# Patient Record
Sex: Male | Born: 1960 | Race: Black or African American | Hispanic: No | Smoking: Current every day smoker
Health system: Southern US, Community
[De-identification: ages and names within clinical notes are randomized; demographics above are authoritative.]

## PROBLEM LIST (undated history)

## (undated) DIAGNOSIS — B1920 Unspecified viral hepatitis C without hepatic coma: Secondary | ICD-10-CM

## (undated) DIAGNOSIS — R569 Unspecified convulsions: Secondary | ICD-10-CM

## (undated) DIAGNOSIS — E119 Type 2 diabetes mellitus without complications: Secondary | ICD-10-CM

## (undated) DIAGNOSIS — F203 Undifferentiated schizophrenia: Secondary | ICD-10-CM

## (undated) DIAGNOSIS — I10 Essential (primary) hypertension: Secondary | ICD-10-CM

## (undated) HISTORY — DX: Undifferentiated schizophrenia: F20.3

## (undated) HISTORY — DX: Essential (primary) hypertension: I10

## (undated) HISTORY — DX: Unspecified viral hepatitis C without hepatic coma: B19.20

## (undated) HISTORY — DX: Type 2 diabetes mellitus without complications: E11.9

---

## 2021-03-27 ENCOUNTER — Emergency Department (HOSPITAL_COMMUNITY): Payer: No Typology Code available for payment source

## 2021-03-27 ENCOUNTER — Encounter (HOSPITAL_COMMUNITY): Payer: Self-pay

## 2021-03-27 ENCOUNTER — Inpatient Hospital Stay (HOSPITAL_COMMUNITY)
Admission: EM | Admit: 2021-03-27 | Discharge: 2021-07-30 | DRG: 956 | Disposition: A | Discharge status: Home / Self Care | Payer: No Typology Code available for payment source | Attending: Internal Medicine | Admitting: Internal Medicine

## 2021-03-27 DIAGNOSIS — E559 Vitamin D deficiency, unspecified: Secondary | ICD-10-CM | POA: Diagnosis present

## 2021-03-27 DIAGNOSIS — E876 Hypokalemia: Secondary | ICD-10-CM | POA: Diagnosis present

## 2021-03-27 DIAGNOSIS — S52371A Galeazzi's fracture of right radius, initial encounter for closed fracture: Secondary | ICD-10-CM | POA: Diagnosis present

## 2021-03-27 DIAGNOSIS — S42231A 3-part fracture of surgical neck of right humerus, initial encounter for closed fracture: Secondary | ICD-10-CM | POA: Diagnosis present

## 2021-03-27 DIAGNOSIS — T148XXA Other injury of unspecified body region, initial encounter: Secondary | ICD-10-CM

## 2021-03-27 DIAGNOSIS — Z781 Physical restraint status: Secondary | ICD-10-CM

## 2021-03-27 DIAGNOSIS — Z978 Presence of other specified devices: Secondary | ICD-10-CM

## 2021-03-27 DIAGNOSIS — I10 Essential (primary) hypertension: Secondary | ICD-10-CM

## 2021-03-27 DIAGNOSIS — Z20822 Contact with and (suspected) exposure to covid-19: Secondary | ICD-10-CM | POA: Diagnosis present

## 2021-03-27 DIAGNOSIS — S83106A Unspecified dislocation of unspecified knee, initial encounter: Secondary | ICD-10-CM

## 2021-03-27 DIAGNOSIS — Z227 Latent tuberculosis: Secondary | ICD-10-CM

## 2021-03-27 DIAGNOSIS — S82201B Unspecified fracture of shaft of right tibia, initial encounter for open fracture type I or II: Secondary | ICD-10-CM

## 2021-03-27 DIAGNOSIS — F10229 Alcohol dependence with intoxication, unspecified: Secondary | ICD-10-CM | POA: Diagnosis present

## 2021-03-27 DIAGNOSIS — Z0189 Encounter for other specified special examinations: Secondary | ICD-10-CM

## 2021-03-27 DIAGNOSIS — S069XAA Unspecified intracranial injury with loss of consciousness status unknown, initial encounter: Secondary | ICD-10-CM

## 2021-03-27 DIAGNOSIS — B182 Chronic viral hepatitis C: Secondary | ICD-10-CM | POA: Diagnosis present

## 2021-03-27 DIAGNOSIS — Z5900 Homelessness unspecified: Secondary | ICD-10-CM

## 2021-03-27 DIAGNOSIS — S066X1A Traumatic subarachnoid hemorrhage with loss of consciousness of 30 minutes or less, initial encounter: Secondary | ICD-10-CM | POA: Diagnosis present

## 2021-03-27 DIAGNOSIS — Y92410 Unspecified street and highway as the place of occurrence of the external cause: Secondary | ICD-10-CM

## 2021-03-27 DIAGNOSIS — F1020 Alcohol dependence, uncomplicated: Secondary | ICD-10-CM

## 2021-03-27 DIAGNOSIS — Y908 Blood alcohol level of 240 mg/100 ml or more: Secondary | ICD-10-CM | POA: Diagnosis present

## 2021-03-27 DIAGNOSIS — S72421A Displaced fracture of lateral condyle of right femur, initial encounter for closed fracture: Secondary | ICD-10-CM | POA: Diagnosis present

## 2021-03-27 DIAGNOSIS — S82141C Displaced bicondylar fracture of right tibia, initial encounter for open fracture type IIIA, IIIB, or IIIC: Principal | ICD-10-CM | POA: Diagnosis present

## 2021-03-27 DIAGNOSIS — F17210 Nicotine dependence, cigarettes, uncomplicated: Secondary | ICD-10-CM | POA: Diagnosis present

## 2021-03-27 DIAGNOSIS — F259 Schizoaffective disorder, unspecified: Secondary | ICD-10-CM | POA: Diagnosis present

## 2021-03-27 DIAGNOSIS — L8990 Pressure ulcer of unspecified site, unspecified stage: Secondary | ICD-10-CM | POA: Insufficient documentation

## 2021-03-27 DIAGNOSIS — E8809 Other disorders of plasma-protein metabolism, not elsewhere classified: Secondary | ICD-10-CM | POA: Diagnosis present

## 2021-03-27 DIAGNOSIS — R9431 Abnormal electrocardiogram [ECG] [EKG]: Secondary | ICD-10-CM | POA: Diagnosis present

## 2021-03-27 DIAGNOSIS — S0101XA Laceration without foreign body of scalp, initial encounter: Secondary | ICD-10-CM | POA: Diagnosis present

## 2021-03-27 DIAGNOSIS — S069X1A Unspecified intracranial injury with loss of consciousness of 30 minutes or less, initial encounter: Secondary | ICD-10-CM

## 2021-03-27 DIAGNOSIS — R748 Abnormal levels of other serum enzymes: Secondary | ICD-10-CM | POA: Diagnosis present

## 2021-03-27 DIAGNOSIS — R402431 Glasgow coma scale score 3-8, in the field [EMT or ambulance]: Secondary | ICD-10-CM | POA: Diagnosis present

## 2021-03-27 DIAGNOSIS — R7611 Nonspecific reaction to tuberculin skin test without active tuberculosis: Secondary | ICD-10-CM

## 2021-03-27 DIAGNOSIS — S82401B Unspecified fracture of shaft of right fibula, initial encounter for open fracture type I or II: Secondary | ICD-10-CM

## 2021-03-27 DIAGNOSIS — R54 Age-related physical debility: Secondary | ICD-10-CM | POA: Diagnosis present

## 2021-03-27 DIAGNOSIS — S062X1A Diffuse traumatic brain injury with loss of consciousness of 30 minutes or less, initial encounter: Secondary | ICD-10-CM

## 2021-03-27 DIAGNOSIS — E11649 Type 2 diabetes mellitus with hypoglycemia without coma: Secondary | ICD-10-CM | POA: Diagnosis not present

## 2021-03-27 DIAGNOSIS — K219 Gastro-esophageal reflux disease without esophagitis: Secondary | ICD-10-CM | POA: Diagnosis present

## 2021-03-27 DIAGNOSIS — Z419 Encounter for procedure for purposes other than remedying health state, unspecified: Secondary | ICD-10-CM

## 2021-03-27 DIAGNOSIS — F209 Schizophrenia, unspecified: Secondary | ICD-10-CM

## 2021-03-27 DIAGNOSIS — S82831B Other fracture of upper and lower end of right fibula, initial encounter for open fracture type I or II: Secondary | ICD-10-CM | POA: Diagnosis present

## 2021-03-27 DIAGNOSIS — F10231 Alcohol dependence with withdrawal delirium: Secondary | ICD-10-CM | POA: Diagnosis not present

## 2021-03-27 DIAGNOSIS — S42201A Unspecified fracture of upper end of right humerus, initial encounter for closed fracture: Secondary | ICD-10-CM

## 2021-03-27 DIAGNOSIS — T1490XA Injury, unspecified, initial encounter: Secondary | ICD-10-CM

## 2021-03-27 DIAGNOSIS — E1165 Type 2 diabetes mellitus with hyperglycemia: Secondary | ICD-10-CM | POA: Diagnosis present

## 2021-03-27 DIAGNOSIS — Z452 Encounter for adjustment and management of vascular access device: Secondary | ICD-10-CM

## 2021-03-27 DIAGNOSIS — D62 Acute posthemorrhagic anemia: Secondary | ICD-10-CM | POA: Diagnosis present

## 2021-03-27 DIAGNOSIS — S42131A Displaced fracture of coracoid process, right shoulder, initial encounter for closed fracture: Secondary | ICD-10-CM | POA: Diagnosis present

## 2021-03-27 DIAGNOSIS — S52611A Displaced fracture of right ulna styloid process, initial encounter for closed fracture: Secondary | ICD-10-CM | POA: Diagnosis present

## 2021-03-27 DIAGNOSIS — E119 Type 2 diabetes mellitus without complications: Secondary | ICD-10-CM

## 2021-03-27 DIAGNOSIS — I609 Nontraumatic subarachnoid hemorrhage, unspecified: Secondary | ICD-10-CM

## 2021-03-27 DIAGNOSIS — Z23 Encounter for immunization: Secondary | ICD-10-CM

## 2021-03-27 DIAGNOSIS — I959 Hypotension, unspecified: Secondary | ICD-10-CM | POA: Diagnosis present

## 2021-03-27 DIAGNOSIS — F10220 Alcohol dependence with intoxication, uncomplicated: Secondary | ICD-10-CM | POA: Diagnosis present

## 2021-03-27 MED ORDER — TETANUS-DIPHTH-ACELL PERTUSSIS 5-2.5-18.5 LF-MCG/0.5 IM SUSY
0.5000 mL | PREFILLED_SYRINGE | Freq: Once | INTRAMUSCULAR | Status: AC
  Administered 2021-03-27: 0.5 mL via INTRAMUSCULAR
  Filled 2021-03-27: qty 0.5

## 2021-03-27 MED ORDER — DEXMEDETOMIDINE BOLUS VIA INFUSION
1.0000 ug/kg | Freq: Once | INTRAVENOUS | Status: AC
  Administered 2021-03-27: 68 ug via INTRAVENOUS
  Filled 2021-03-27: qty 68

## 2021-03-27 MED ORDER — CEFAZOLIN SODIUM-DEXTROSE 2-4 GM/100ML-% IV SOLN
2.0000 g | Freq: Once | INTRAVENOUS | Status: AC
  Administered 2021-03-27: 2 g via INTRAVENOUS
  Filled 2021-03-27: qty 100

## 2021-03-27 MED ORDER — SUCCINYLCHOLINE CHLORIDE 20 MG/ML IJ SOLN
INTRAMUSCULAR | Status: AC | PRN
  Administered 2021-03-27: 150 mg via INTRAVENOUS

## 2021-03-27 MED ORDER — IOHEXOL 300 MG/ML  SOLN
100.0000 mL | Freq: Once | INTRAMUSCULAR | Status: AC | PRN
  Administered 2021-03-27: 100 mL via INTRAVENOUS

## 2021-03-27 MED ORDER — ETOMIDATE 2 MG/ML IV SOLN
INTRAVENOUS | Status: AC | PRN
  Administered 2021-03-27: 20 mg via INTRAVENOUS

## 2021-03-27 MED ORDER — IOHEXOL 350 MG/ML SOLN
100.0000 mL | Freq: Once | INTRAVENOUS | Status: AC | PRN
  Administered 2021-03-27: 100 mL via INTRAVENOUS

## 2021-03-27 MED ORDER — PROPOFOL 1000 MG/100ML IV EMUL
INTRAVENOUS | Status: AC
  Filled 2021-03-27: qty 100

## 2021-03-27 MED ORDER — DEXMEDETOMIDINE HCL IN NACL 400 MCG/100ML IV SOLN
0.4000 ug/kg/h | INTRAVENOUS | Status: AC
  Administered 2021-03-27: 0.4 ug/kg/h via INTRAVENOUS
  Filled 2021-03-27: qty 100
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
## 2021-03-28 ENCOUNTER — Inpatient Hospital Stay (HOSPITAL_COMMUNITY): Payer: No Typology Code available for payment source

## 2021-03-28 ENCOUNTER — Encounter (HOSPITAL_COMMUNITY): Admission: EM | Disposition: A | Discharge status: Home / Self Care | Payer: Self-pay | Source: Home / Self Care

## 2021-03-28 ENCOUNTER — Emergency Department (HOSPITAL_COMMUNITY): Payer: No Typology Code available for payment source

## 2021-03-28 ENCOUNTER — Inpatient Hospital Stay (HOSPITAL_COMMUNITY): Payer: No Typology Code available for payment source | Admitting: Anesthesiology

## 2021-03-28 DIAGNOSIS — I959 Hypotension, unspecified: Secondary | ICD-10-CM | POA: Diagnosis present

## 2021-03-28 DIAGNOSIS — E8809 Other disorders of plasma-protein metabolism, not elsewhere classified: Secondary | ICD-10-CM | POA: Diagnosis present

## 2021-03-28 DIAGNOSIS — I10 Essential (primary) hypertension: Secondary | ICD-10-CM | POA: Diagnosis present

## 2021-03-28 DIAGNOSIS — E11649 Type 2 diabetes mellitus with hypoglycemia without coma: Secondary | ICD-10-CM | POA: Diagnosis not present

## 2021-03-28 DIAGNOSIS — E559 Vitamin D deficiency, unspecified: Secondary | ICD-10-CM | POA: Diagnosis present

## 2021-03-28 DIAGNOSIS — Z20822 Contact with and (suspected) exposure to covid-19: Secondary | ICD-10-CM | POA: Diagnosis present

## 2021-03-28 DIAGNOSIS — Z5900 Homelessness unspecified: Secondary | ICD-10-CM | POA: Diagnosis not present

## 2021-03-28 DIAGNOSIS — F259 Schizoaffective disorder, unspecified: Secondary | ICD-10-CM | POA: Diagnosis present

## 2021-03-28 DIAGNOSIS — S066X1A Traumatic subarachnoid hemorrhage with loss of consciousness of 30 minutes or less, initial encounter: Secondary | ICD-10-CM | POA: Diagnosis present

## 2021-03-28 DIAGNOSIS — S0101XA Laceration without foreign body of scalp, initial encounter: Secondary | ICD-10-CM | POA: Diagnosis present

## 2021-03-28 DIAGNOSIS — S72421A Displaced fracture of lateral condyle of right femur, initial encounter for closed fracture: Secondary | ICD-10-CM | POA: Diagnosis present

## 2021-03-28 DIAGNOSIS — Y908 Blood alcohol level of 240 mg/100 ml or more: Secondary | ICD-10-CM | POA: Diagnosis present

## 2021-03-28 DIAGNOSIS — Z23 Encounter for immunization: Secondary | ICD-10-CM | POA: Diagnosis not present

## 2021-03-28 DIAGNOSIS — R54 Age-related physical debility: Secondary | ICD-10-CM | POA: Diagnosis present

## 2021-03-28 DIAGNOSIS — Y92410 Unspecified street and highway as the place of occurrence of the external cause: Secondary | ICD-10-CM | POA: Diagnosis not present

## 2021-03-28 DIAGNOSIS — T1490XA Injury, unspecified, initial encounter: Secondary | ICD-10-CM | POA: Diagnosis present

## 2021-03-28 DIAGNOSIS — S52611A Displaced fracture of right ulna styloid process, initial encounter for closed fracture: Secondary | ICD-10-CM | POA: Diagnosis present

## 2021-03-28 DIAGNOSIS — S52371A Galeazzi's fracture of right radius, initial encounter for closed fracture: Secondary | ICD-10-CM | POA: Diagnosis present

## 2021-03-28 DIAGNOSIS — S42231A 3-part fracture of surgical neck of right humerus, initial encounter for closed fracture: Secondary | ICD-10-CM | POA: Diagnosis present

## 2021-03-28 DIAGNOSIS — S82141C Displaced bicondylar fracture of right tibia, initial encounter for open fracture type IIIA, IIIB, or IIIC: Secondary | ICD-10-CM | POA: Diagnosis present

## 2021-03-28 DIAGNOSIS — D62 Acute posthemorrhagic anemia: Secondary | ICD-10-CM | POA: Diagnosis present

## 2021-03-28 DIAGNOSIS — E1165 Type 2 diabetes mellitus with hyperglycemia: Secondary | ICD-10-CM | POA: Diagnosis present

## 2021-03-28 DIAGNOSIS — F10229 Alcohol dependence with intoxication, unspecified: Secondary | ICD-10-CM | POA: Diagnosis present

## 2021-03-28 DIAGNOSIS — F10231 Alcohol dependence with withdrawal delirium: Secondary | ICD-10-CM | POA: Diagnosis not present

## 2021-03-28 DIAGNOSIS — S82831B Other fracture of upper and lower end of right fibula, initial encounter for open fracture type I or II: Secondary | ICD-10-CM | POA: Diagnosis present

## 2021-03-28 DIAGNOSIS — B182 Chronic viral hepatitis C: Secondary | ICD-10-CM | POA: Diagnosis present

## 2021-03-28 HISTORY — PX: EXTERNAL FIXATION LEG: SHX1549

## 2021-03-28 HISTORY — PX: I & D EXTREMITY: SHX5045

## 2021-03-28 LAB — COMPREHENSIVE METABOLIC PANEL
ALT: 156 U/L — ABNORMAL HIGH (ref 0–44)
AST: 260 U/L — ABNORMAL HIGH (ref 15–41)
Albumin: 1.9 g/dL — ABNORMAL LOW (ref 3.5–5.0)
Alkaline Phosphatase: 64 U/L (ref 38–126)
Anion gap: 11 (ref 5–15)
BUN: 11 mg/dL (ref 6–20)
CO2: 17 mmol/L — ABNORMAL LOW (ref 22–32)
Calcium: 6.7 mg/dL — ABNORMAL LOW (ref 8.9–10.3)
Chloride: 106 mmol/L (ref 98–111)
Creatinine, Ser: 1.03 mg/dL (ref 0.61–1.24)
GFR, Estimated: 49 mL/min — ABNORMAL LOW (ref >60)
Glucose, Bld: 189 mg/dL — ABNORMAL HIGH (ref 70–99)
Potassium: 3.8 mmol/L (ref 3.5–5.1)
Sodium: 134 mmol/L — ABNORMAL LOW (ref 135–145)
Total Bilirubin: 0.4 mg/dL (ref 0.3–1.2)
Total Protein: 4.3 g/dL — ABNORMAL LOW (ref 6.5–8.1)

## 2021-03-28 LAB — POCT I-STAT 7, (LYTES, BLD GAS, ICA,H+H)
Acid-base deficit: 5 mmol/L — ABNORMAL HIGH (ref 0.0–2.0)
Acid-base deficit: 9 mmol/L — ABNORMAL HIGH (ref 0.0–2.0)
Acid-base deficit: 4 mmol/L — ABNORMAL HIGH (ref 0.0–2.0)
Bicarbonate: 21.2 mmol/L (ref 20.0–28.0)
Bicarbonate: 19.8 mmol/L — ABNORMAL LOW (ref 20.0–28.0)
Bicarbonate: 22.6 mmol/L (ref 20.0–28.0)
Calcium, Ion: 0.97 mmol/L — ABNORMAL LOW (ref 1.15–1.40)
Calcium, Ion: 1.04 mmol/L — ABNORMAL LOW (ref 1.15–1.40)
Calcium, Ion: 1.07 mmol/L — ABNORMAL LOW (ref 1.15–1.40)
HCT: 30 % — ABNORMAL LOW (ref 39.0–52.0)
HCT: 35 % — ABNORMAL LOW (ref 39.0–52.0)
HCT: 28 % — ABNORMAL LOW (ref 39.0–52.0)
Hemoglobin: 10.2 g/dL — ABNORMAL LOW (ref 13.0–17.0)
Hemoglobin: 11.9 g/dL — ABNORMAL LOW (ref 13.0–17.0)
Hemoglobin: 9.5 g/dL — ABNORMAL LOW (ref 13.0–17.0)
O2 Saturation: 100 %
O2 Saturation: 100 %
O2 Saturation: 100 %
Patient temperature: 36
Patient temperature: 36
Potassium: 4.6 mmol/L (ref 3.5–5.1)
Potassium: 4.4 mmol/L (ref 3.5–5.1)
Potassium: 3.8 mmol/L (ref 3.5–5.1)
Sodium: 138 mmol/L (ref 135–145)
Sodium: 140 mmol/L (ref 135–145)
Sodium: 143 mmol/L (ref 135–145)
TCO2: 23 mmol/L (ref 22–32)
TCO2: 21 mmol/L — ABNORMAL LOW (ref 22–32)
TCO2: 24 mmol/L (ref 22–32)
pCO2 arterial: 43.1 mmHg (ref 32.0–48.0)
pCO2 arterial: 52.9 mmHg — ABNORMAL HIGH (ref 32.0–48.0)
pCO2 arterial: 46.8 mmHg (ref 32.0–48.0)
pH, Arterial: 7.294 — ABNORMAL LOW (ref 7.350–7.450)
pH, Arterial: 7.176 — CL (ref 7.350–7.450)
pH, Arterial: 7.293 — ABNORMAL LOW (ref 7.350–7.450)
pO2, Arterial: 515 mmHg — ABNORMAL HIGH (ref 83.0–108.0)
pO2, Arterial: 546 mmHg — ABNORMAL HIGH (ref 83.0–108.0)
pO2, Arterial: 542 mmHg — ABNORMAL HIGH (ref 83.0–108.0)

## 2021-03-28 LAB — BASIC METABOLIC PANEL
Anion gap: 10 (ref 5–15)
BUN: 10 mg/dL (ref 6–20)
CO2: 20 mmol/L — ABNORMAL LOW (ref 22–32)
Calcium: 7.3 mg/dL — ABNORMAL LOW (ref 8.9–10.3)
Chloride: 109 mmol/L (ref 98–111)
Creatinine, Ser: 0.85 mg/dL (ref 0.61–1.24)
GFR, Estimated: 59 mL/min — ABNORMAL LOW (ref >60)
Glucose, Bld: 177 mg/dL — ABNORMAL HIGH (ref 70–99)
Potassium: 4 mmol/L (ref 3.5–5.1)
Sodium: 139 mmol/L (ref 135–145)

## 2021-03-28 LAB — CBC
HCT: 29.1 % — ABNORMAL LOW (ref 39.0–52.0)
HCT: 33.7 % — ABNORMAL LOW (ref 39.0–52.0)
HCT: 31 % — ABNORMAL LOW (ref 39.0–52.0)
Hemoglobin: 9.6 g/dL — ABNORMAL LOW (ref 13.0–17.0)
Hemoglobin: 11.6 g/dL — ABNORMAL LOW (ref 13.0–17.0)
Hemoglobin: 10.6 g/dL — ABNORMAL LOW (ref 13.0–17.0)
MCH: 30.9 pg (ref 26.0–34.0)
MCH: 31.6 pg (ref 26.0–34.0)
MCH: 29.3 pg (ref 26.0–34.0)
MCHC: 33 g/dL (ref 30.0–36.0)
MCHC: 34.4 g/dL (ref 30.0–36.0)
MCHC: 34.2 g/dL (ref 30.0–36.0)
MCV: 93.6 fL (ref 80.0–100.0)
MCV: 91.8 fL (ref 80.0–100.0)
MCV: 85.6 fL (ref 80.0–100.0)
Platelets: 120 10*3/uL — ABNORMAL LOW (ref 150–400)
Platelets: 92 10*3/uL — ABNORMAL LOW (ref 150–400)
Platelets: 73 10*3/uL — ABNORMAL LOW (ref 150–400)
RBC: 3.11 MIL/uL — ABNORMAL LOW (ref 4.22–5.81)
RBC: 3.67 MIL/uL — ABNORMAL LOW (ref 4.22–5.81)
RBC: 3.62 MIL/uL — ABNORMAL LOW (ref 4.22–5.81)
RDW: 14.9 % (ref 11.5–15.5)
RDW: 14.6 % (ref 11.5–15.5)
RDW: 16.8 % — ABNORMAL HIGH (ref 11.5–15.5)
WBC: 14.3 10*3/uL — ABNORMAL HIGH (ref 4.0–10.5)
WBC: 16.3 10*3/uL — ABNORMAL HIGH (ref 4.0–10.5)
WBC: 13 10*3/uL — ABNORMAL HIGH (ref 4.0–10.5)
nRBC: 0 % (ref 0.0–0.2)
nRBC: 0 % (ref 0.0–0.2)
nRBC: 0 % (ref 0.0–0.2)

## 2021-03-28 LAB — PROTIME-INR
INR: 1.4 — ABNORMAL HIGH (ref 0.8–1.2)
Prothrombin Time: 17.3 seconds — ABNORMAL HIGH (ref 11.4–15.2)

## 2021-03-28 LAB — I-STAT CHEM 8, ED
BUN: 10 mg/dL (ref 8–23)
Calcium, Ion: 0.89 mmol/L — CL (ref 1.15–1.40)
Chloride: 103 mmol/L (ref 98–111)
Creatinine, Ser: 1.4 mg/dL — ABNORMAL HIGH (ref 0.61–1.24)
Glucose, Bld: 169 mg/dL — ABNORMAL HIGH (ref 70–99)
HCT: 27 % — ABNORMAL LOW (ref 39.0–52.0)
Hemoglobin: 9.2 g/dL — ABNORMAL LOW (ref 13.0–17.0)
Potassium: 3.6 mmol/L (ref 3.5–5.1)
Sodium: 138 mmol/L (ref 135–145)
TCO2: 20 mmol/L — ABNORMAL LOW (ref 22–32)

## 2021-03-28 LAB — POCT I-STAT, CHEM 8
BUN: 10 mg/dL (ref 6–20)
Calcium, Ion: 0.89 mmol/L — CL (ref 1.15–1.40)
Chloride: 103 mmol/L (ref 98–111)
Creatinine, Ser: 1.4 mg/dL — ABNORMAL HIGH (ref 0.61–1.24)
Glucose, Bld: 169 mg/dL — ABNORMAL HIGH (ref 70–99)
HCT: 27 % — ABNORMAL LOW (ref 39.0–52.0)
Hemoglobin: 9.2 g/dL — ABNORMAL LOW (ref 13.0–17.0)
Potassium: 3.6 mmol/L (ref 3.5–5.1)
Sodium: 138 mmol/L (ref 135–145)
TCO2: 20 mmol/L — ABNORMAL LOW (ref 22–32)

## 2021-03-28 LAB — CBC WITH DIFFERENTIAL/PLATELET
Abs Immature Granulocytes: 0.03 10*3/uL (ref 0.00–0.07)
Basophils Absolute: 0 10*3/uL (ref 0.0–0.1)
Basophils Relative: 0 %
Eosinophils Absolute: 0 10*3/uL (ref 0.0–0.5)
Eosinophils Relative: 0 %
HCT: 30.9 % — ABNORMAL LOW (ref 39.0–52.0)
Hemoglobin: 10.7 g/dL — ABNORMAL LOW (ref 13.0–17.0)
Immature Granulocytes: 0 %
Lymphocytes Relative: 20 %
Lymphs Abs: 2.2 10*3/uL (ref 0.7–4.0)
MCH: 29 pg (ref 26.0–34.0)
MCHC: 34.6 g/dL (ref 30.0–36.0)
MCV: 83.7 fL (ref 80.0–100.0)
Monocytes Absolute: 1 10*3/uL (ref 0.1–1.0)
Monocytes Relative: 10 %
Neutro Abs: 7.5 10*3/uL (ref 1.7–7.7)
Neutrophils Relative %: 70 %
Platelets: 69 10*3/uL — ABNORMAL LOW (ref 150–400)
RBC: 3.69 MIL/uL — ABNORMAL LOW (ref 4.22–5.81)
RDW: 17.5 % — ABNORMAL HIGH (ref 11.5–15.5)
WBC: 10.8 10*3/uL — ABNORMAL HIGH (ref 4.0–10.5)
nRBC: 0.2 % (ref 0.0–0.2)

## 2021-03-28 LAB — I-STAT ARTERIAL BLOOD GAS, ED
Acid-base deficit: 6 mmol/L — ABNORMAL HIGH (ref 0.0–2.0)
Bicarbonate: 19.7 mmol/L — ABNORMAL LOW (ref 20.0–28.0)
Calcium, Ion: 0.87 mmol/L — CL (ref 1.15–1.40)
HCT: 25 % — ABNORMAL LOW (ref 39.0–52.0)
Hemoglobin: 8.5 g/dL — ABNORMAL LOW (ref 13.0–17.0)
O2 Saturation: 100 %
Patient temperature: 95.7
Potassium: 4 mmol/L (ref 3.5–5.1)
Sodium: 137 mmol/L (ref 135–145)
TCO2: 21 mmol/L — ABNORMAL LOW (ref 22–32)
pCO2 arterial: 36.9 mmHg (ref 32.0–48.0)
pH, Arterial: 7.328 — ABNORMAL LOW (ref 7.350–7.450)
pO2, Arterial: 399 mmHg — ABNORMAL HIGH (ref 83.0–108.0)

## 2021-03-28 LAB — PREPARE RBC (CROSSMATCH)

## 2021-03-28 LAB — ETHANOL: Alcohol, Ethyl (B): 299 mg/dL — ABNORMAL HIGH (ref <10)

## 2021-03-28 LAB — ABO/RH: ABO/RH(D): O POS

## 2021-03-28 LAB — RESP PANEL BY RT-PCR (FLU A&B, COVID) ARPGX2
Influenza A by PCR: NEGATIVE
Influenza B by PCR: NEGATIVE
SARS Coronavirus 2 by RT PCR: NEGATIVE

## 2021-03-28 LAB — MRSA NEXT GEN BY PCR, NASAL: MRSA by PCR Next Gen: DETECTED — AB

## 2021-03-28 LAB — LACTIC ACID, PLASMA: Lactic Acid, Venous: 4.1 mmol/L (ref 0.5–1.9)

## 2021-03-28 SURGERY — IRRIGATION AND DEBRIDEMENT EXTREMITY
Anesthesia: General | Laterality: Right

## 2021-03-28 MED ORDER — SODIUM CHLORIDE 0.9% IV SOLUTION: Freq: Once | INTRAVENOUS | Status: DC

## 2021-03-28 MED ORDER — HYDRALAZINE HCL 20 MG/ML IJ SOLN
10.0000 mg | INTRAMUSCULAR | Status: DC | PRN
Start: 2021-03-28 — End: 2021-07-30
  Administered 2021-04-02: 10 mg via INTRAVENOUS
  Filled 2021-03-28: qty 1

## 2021-03-28 MED ORDER — ACETAMINOPHEN 500 MG PO TABS
1000.0000 mg | ORAL_TABLET | Freq: Four times a day (QID) | ORAL | Status: DC
  Administered 2021-03-28 – 2021-07-04 (×279): 1000 mg via ORAL
  Filled 2021-03-28 (×299): qty 2

## 2021-03-28 MED ORDER — SODIUM CHLORIDE 0.9 % IR SOLN
Status: DC | PRN
  Administered 2021-03-28: 3000 mL

## 2021-03-28 MED ORDER — SODIUM CHLORIDE 0.9 % IV SOLN: INTRAVENOUS | Status: DC | PRN

## 2021-03-28 MED ORDER — ONDANSETRON 4 MG PO TBDP: 4.0000 mg | ORAL_TABLET | Freq: Four times a day (QID) | ORAL | Status: DC | PRN

## 2021-03-28 MED ORDER — ONDANSETRON HCL 4 MG/2ML IJ SOLN
4.0000 mg | Freq: Four times a day (QID) | INTRAMUSCULAR | Status: DC | PRN
  Filled 2021-03-28: qty 2

## 2021-03-28 MED ORDER — CHLORHEXIDINE GLUCONATE CLOTH 2 % EX PADS: 6.0000 | MEDICATED_PAD | Freq: Every day | CUTANEOUS | Status: DC

## 2021-03-28 MED ORDER — NOREPINEPHRINE 4 MG/250ML-% IV SOLN
INTRAVENOUS | Status: AC
  Administered 2021-03-28: 5 ug/min via INTRAVENOUS
  Filled 2021-03-28: qty 250

## 2021-03-28 MED ORDER — DOCUSATE SODIUM 50 MG/5ML PO LIQD
100.0000 mg | Freq: Two times a day (BID) | ORAL | Status: DC
  Administered 2021-03-30 – 2021-03-31 (×2): 100 mg
  Filled 2021-03-28 (×4): qty 10

## 2021-03-28 MED ORDER — ORAL CARE MOUTH RINSE
15.0000 mL | OROMUCOSAL | Status: DC
  Administered 2021-03-28 – 2021-03-29 (×12): 15 mL via OROMUCOSAL

## 2021-03-28 MED ORDER — LACTATED RINGERS IV SOLN: INTRAVENOUS | Status: DC

## 2021-03-28 MED ORDER — OXYCODONE HCL 5 MG PO TABS
10.0000 mg | ORAL_TABLET | Freq: Four times a day (QID) | ORAL | Status: DC | PRN
Start: 2021-03-28 — End: 2021-04-01
  Administered 2021-03-29 – 2021-03-31 (×2): 10 mg via ORAL
  Filled 2021-03-28 (×2): qty 2

## 2021-03-28 MED ORDER — VANCOMYCIN HCL 1000 MG IV SOLR
INTRAVENOUS | Status: AC
  Filled 2021-03-28: qty 20

## 2021-03-28 MED ORDER — CHLORHEXIDINE GLUCONATE CLOTH 2 % EX PADS
6.0000 | MEDICATED_PAD | Freq: Every day | CUTANEOUS | Status: DC
  Administered 2021-03-28 – 2021-04-06 (×10): 6 via TOPICAL

## 2021-03-28 MED ORDER — LEVETIRACETAM IN NACL 1000 MG/100ML IV SOLN
1000.0000 mg | Freq: Two times a day (BID) | INTRAVENOUS | Status: DC
Start: 2021-03-28 — End: 2021-04-03
  Administered 2021-03-28 – 2021-04-03 (×13): 1000 mg via INTRAVENOUS
  Filled 2021-03-28 (×14): qty 100

## 2021-03-28 MED ORDER — ROCURONIUM BROMIDE 100 MG/10ML IV SOLN
INTRAVENOUS | Status: DC | PRN
  Administered 2021-03-28: 100 mg via INTRAVENOUS

## 2021-03-28 MED ORDER — ROCURONIUM BROMIDE 10 MG/ML (PF) SYRINGE
PREFILLED_SYRINGE | INTRAVENOUS | Status: AC
  Filled 2021-03-28: qty 10

## 2021-03-28 MED ORDER — VASOPRESSIN 20 UNIT/ML IV SOLN
INTRAVENOUS | Status: AC
  Filled 2021-03-28: qty 1

## 2021-03-28 MED ORDER — CEFAZOLIN SODIUM-DEXTROSE 2-3 GM-%(50ML) IV SOLR
INTRAVENOUS | Status: DC | PRN
  Administered 2021-03-28: 2 g via INTRAVENOUS

## 2021-03-28 MED ORDER — VASOPRESSIN 20 UNIT/ML IV SOLN
INTRAVENOUS | Status: DC | PRN
  Administered 2021-03-28: 3 [IU] via INTRAVENOUS
  Administered 2021-03-28: 2 [IU] via INTRAVENOUS

## 2021-03-28 MED ORDER — DEXMEDETOMIDINE HCL IN NACL 400 MCG/100ML IV SOLN
0.0000 ug/kg/h | INTRAVENOUS | Status: AC
Start: 2021-03-28 — End: 2021-03-31
  Administered 2021-03-28: 0.4 ug/kg/h via INTRAVENOUS
  Administered 2021-03-28: .7 ug/kg/h via INTRAVENOUS
  Filled 2021-03-28 (×2): qty 100

## 2021-03-28 MED ORDER — CHLORHEXIDINE GLUCONATE 0.12% ORAL RINSE (MEDLINE KIT)
15.0000 mL | Freq: Two times a day (BID) | OROMUCOSAL | Status: DC
  Administered 2021-03-28 – 2021-03-29 (×3): 15 mL via OROMUCOSAL

## 2021-03-28 MED ORDER — FENTANYL BOLUS VIA INFUSION
50.0000 ug | INTRAVENOUS | Status: DC | PRN
  Filled 2021-03-28: qty 100

## 2021-03-28 MED ORDER — VASOPRESSIN 20 UNITS/100 ML INFUSION FOR SHOCK
0.0000 [IU]/min | INTRAVENOUS | Status: DC
  Administered 2021-03-28 (×2): 0.03 [IU]/min via INTRAVENOUS
  Filled 2021-03-28 (×3): qty 100

## 2021-03-28 MED ORDER — FENTANYL CITRATE (PF) 250 MCG/5ML IJ SOLN
INTRAMUSCULAR | Status: AC
  Filled 2021-03-28: qty 5

## 2021-03-28 MED ORDER — PHENYLEPHRINE 40 MCG/ML (10ML) SYRINGE FOR IV PUSH (FOR BLOOD PRESSURE SUPPORT)
PREFILLED_SYRINGE | INTRAVENOUS | Status: AC
  Filled 2021-03-28: qty 10

## 2021-03-28 MED ORDER — FENTANYL CITRATE PF 50 MCG/ML IJ SOSY
25.0000 ug | PREFILLED_SYRINGE | INTRAMUSCULAR | Status: AC | PRN
  Administered 2021-03-28 (×3): 25 ug via INTRAVENOUS
  Filled 2021-03-28 (×2): qty 1

## 2021-03-28 MED ORDER — CEFTRIAXONE SODIUM 2 G IJ SOLR
2.0000 g | INTRAMUSCULAR | Status: AC
  Administered 2021-03-28 – 2021-03-30 (×3): 2 g via INTRAVENOUS
  Filled 2021-03-28 (×3): qty 20

## 2021-03-28 MED ORDER — 0.9 % SODIUM CHLORIDE (POUR BTL) OPTIME
TOPICAL | Status: DC | PRN
  Administered 2021-03-28: 1000 mL

## 2021-03-28 MED ORDER — SODIUM BICARBONATE 8.4 % IV SOLN
INTRAVENOUS | Status: DC | PRN
  Administered 2021-03-28: 100 meq via INTRAVENOUS

## 2021-03-28 MED ORDER — SODIUM CHLORIDE 0.9 % IV SOLN
INTRAVENOUS | Status: AC | PRN
  Administered 2021-03-27: 1000 mL via INTRAVENOUS

## 2021-03-28 MED ORDER — FENTANYL CITRATE (PF) 250 MCG/5ML IJ SOLN
INTRAMUSCULAR | Status: DC | PRN
  Administered 2021-03-28: 100 ug via INTRAVENOUS
  Administered 2021-03-28 (×3): 50 ug via INTRAVENOUS

## 2021-03-28 MED ORDER — NOREPINEPHRINE 4 MG/250ML-% IV SOLN
0.0000 ug/min | INTRAVENOUS | Status: DC
Start: 2021-03-28 — End: 2021-03-28

## 2021-03-28 MED ORDER — NOREPINEPHRINE 4 MG/250ML-% IV SOLN
0.0000 ug/min | INTRAVENOUS | Status: DC
  Administered 2021-03-28: 10 ug/min via INTRAVENOUS
  Administered 2021-03-28: 20 ug/min via INTRAVENOUS
  Administered 2021-03-28: 15 ug/min via INTRAVENOUS
  Filled 2021-03-28 (×4): qty 250

## 2021-03-28 MED ORDER — FENTANYL 2500MCG IN NS 250ML (10MCG/ML) PREMIX INFUSION
0.0000 ug/h | INTRAVENOUS | Status: DC
  Administered 2021-03-28: 25 ug/h via INTRAVENOUS
  Filled 2021-03-28: qty 250

## 2021-03-28 MED ORDER — PANTOPRAZOLE SODIUM 40 MG IV SOLR
40.0000 mg | INTRAVENOUS | Status: DC
  Administered 2021-03-28 – 2021-03-31 (×3): 40 mg via INTRAVENOUS
  Filled 2021-03-28 (×3): qty 40

## 2021-03-28 MED ORDER — CALCIUM CHLORIDE 10 % IV SOLN
INTRAVENOUS | Status: DC | PRN
  Administered 2021-03-28: 500 mg via INTRAVENOUS

## 2021-03-28 MED ORDER — MUPIROCIN 2 % EX OINT
1.0000 | TOPICAL_OINTMENT | Freq: Two times a day (BID) | CUTANEOUS | Status: AC
Start: 2021-03-28 — End: 2021-04-02
  Administered 2021-03-28 – 2021-04-01 (×8): 1 via NASAL
  Filled 2021-03-28 (×3): qty 22

## 2021-03-28 MED ORDER — HYDROMORPHONE HCL 1 MG/ML IJ SOLN
0.5000 mg | INTRAMUSCULAR | Status: DC | PRN
  Administered 2021-03-28 – 2021-04-01 (×7): 0.5 mg via INTRAVENOUS
  Filled 2021-03-28 (×3): qty 1
  Filled 2021-03-28: qty 0.5
  Filled 2021-03-28 (×2): qty 1
  Filled 2021-03-28: qty 0.5
  Filled 2021-03-28: qty 1

## 2021-03-28 MED ORDER — CEFAZOLIN SODIUM 1 G IJ SOLR
INTRAMUSCULAR | Status: AC
  Filled 2021-03-28: qty 20

## 2021-03-28 MED ORDER — SODIUM BICARBONATE 8.4 % IV SOLN
INTRAVENOUS | Status: AC
  Filled 2021-03-28: qty 50

## 2021-03-28 MED ORDER — PROPOFOL 10 MG/ML IV BOLUS
INTRAVENOUS | Status: AC
  Filled 2021-03-28: qty 20

## 2021-03-28 MED ORDER — VANCOMYCIN HCL 1000 MG IV SOLR
INTRAVENOUS | Status: DC | PRN
  Administered 2021-03-28: 1000 mg via TOPICAL

## 2021-03-28 MED ORDER — CHLORHEXIDINE GLUCONATE CLOTH 2 % EX PADS
6.0000 | MEDICATED_PAD | Freq: Every day | CUTANEOUS | Status: AC
  Administered 2021-03-30 – 2021-04-01 (×3): 6 via TOPICAL

## 2021-03-28 MED ORDER — SODIUM CHLORIDE 0.9 % IV SOLN
250.0000 mL | INTRAVENOUS | Status: DC
  Administered 2021-04-03: 250 mL via INTRAVENOUS

## 2021-03-28 MED ORDER — POLYETHYLENE GLYCOL 3350 17 G PO PACK
17.0000 g | PACK | Freq: Every day | ORAL | Status: DC
  Filled 2021-03-28: qty 1

## 2021-03-28 MED ORDER — EPHEDRINE 5 MG/ML INJ
INTRAVENOUS | Status: AC
  Filled 2021-03-28: qty 5

## 2021-03-28 MED ORDER — FENTANYL CITRATE PF 50 MCG/ML IJ SOSY
25.0000 ug | PREFILLED_SYRINGE | INTRAMUSCULAR | Status: DC | PRN
  Administered 2021-03-28: 50 ug via INTRAVENOUS
  Administered 2021-03-28: 100 ug via INTRAVENOUS
  Administered 2021-03-28 (×2): 50 ug via INTRAVENOUS
  Filled 2021-03-28 (×2): qty 1
  Filled 2021-03-28: qty 2
  Filled 2021-03-28: qty 1

## 2021-03-28 SURGICAL SUPPLY — 66 items
BAG COUNTER SPONGE SURGICOUNT (BAG) ×2 IMPLANT
BANDAGE ESMARK 6X9 LF (GAUZE/BANDAGES/DRESSINGS) IMPLANT
BNDG COHESIVE 4X5 TAN STRL (GAUZE/BANDAGES/DRESSINGS) ×2 IMPLANT
BNDG COHESIVE 6X5 TAN STRL LF (GAUZE/BANDAGES/DRESSINGS) ×2 IMPLANT
BNDG ELASTIC 4X5.8 VLCR STR LF (GAUZE/BANDAGES/DRESSINGS) ×2 IMPLANT
BNDG ELASTIC 6X10 VLCR STRL LF (GAUZE/BANDAGES/DRESSINGS) ×2 IMPLANT
BNDG ELASTIC 6X5.8 VLCR STR LF (GAUZE/BANDAGES/DRESSINGS) ×2 IMPLANT
BNDG ESMARK 6X9 LF (GAUZE/BANDAGES/DRESSINGS)
BNDG GAUZE ELAST 4 BULKY (GAUZE/BANDAGES/DRESSINGS) ×2 IMPLANT
BRUSH SCRUB EZ PLAIN DRY (MISCELLANEOUS) ×4 IMPLANT
COVER SURGICAL LIGHT HANDLE (MISCELLANEOUS) ×4 IMPLANT
CUFF TOURN SGL QUICK 18X4 (TOURNIQUET CUFF) ×2 IMPLANT
DRAPE C-ARM 42X72 X-RAY (DRAPES) IMPLANT
DRAPE C-ARMOR (DRAPES) ×2 IMPLANT
DRAPE U-SHAPE 47X51 STRL (DRAPES) ×4 IMPLANT
DRSG ADAPTIC 3X8 NADH LF (GAUZE/BANDAGES/DRESSINGS) ×2 IMPLANT
DRSG MEPITEL 4X7.2 (GAUZE/BANDAGES/DRESSINGS) IMPLANT
ELECT REM PT RETURN 9FT ADLT (ELECTROSURGICAL) ×2
ELECTRODE REM PT RTRN 9FT ADLT (ELECTROSURGICAL) ×1 IMPLANT
EVACUATOR 1/8 PVC DRAIN (DRAIN) IMPLANT
GAUZE SPONGE 4X4 12PLY STRL (GAUZE/BANDAGES/DRESSINGS) ×2 IMPLANT
GAUZE XEROFORM 1X8 LF (GAUZE/BANDAGES/DRESSINGS) ×4 IMPLANT
GLOVE SRG 8 PF TXTR STRL LF DI (GLOVE) ×1 IMPLANT
GLOVE SURG ENC MOIS LTX SZ7.5 (GLOVE) ×2 IMPLANT
GLOVE SURG ENC MOIS LTX SZ8 (GLOVE) ×2 IMPLANT
GLOVE SURG UNDER POLY LF SZ7.5 (GLOVE) ×2 IMPLANT
GLOVE SURG UNDER POLY LF SZ8 (GLOVE) ×1
GLOVE SURG UNDER POLY LF SZ9 (GLOVE) ×2 IMPLANT
GOWN STRL REUS W/ TWL LRG LVL3 (GOWN DISPOSABLE) ×2 IMPLANT
GOWN STRL REUS W/ TWL XL LVL3 (GOWN DISPOSABLE) ×1 IMPLANT
GOWN STRL REUS W/TWL LRG LVL3 (GOWN DISPOSABLE) ×2
GOWN STRL REUS W/TWL XL LVL3 (GOWN DISPOSABLE) ×1
HANDPIECE INTERPULSE COAX TIP (DISPOSABLE)
KIT BASIN OR (CUSTOM PROCEDURE TRAY) ×2 IMPLANT
KIT TURNOVER KIT B (KITS) ×2 IMPLANT
MANIFOLD NEPTUNE II (INSTRUMENTS) ×2 IMPLANT
NEEDLE 22X1 1/2 (OR ONLY) (NEEDLE) IMPLANT
NS IRRIG 1000ML POUR BTL (IV SOLUTION) ×2 IMPLANT
PACK ORTHO EXTREMITY (CUSTOM PROCEDURE TRAY) ×2 IMPLANT
PAD ARMBOARD 7.5X6 YLW CONV (MISCELLANEOUS) ×4 IMPLANT
PADDING CAST COTTON 6X4 STRL (CAST SUPPLIES) ×4 IMPLANT
PIN APEX 180X50X5XST SLF (PIN) ×2 IMPLANT
PIN APEX 5X180 (PIN) ×2
PIN CLAMP 5H 30DEG POST (EXFIX) ×4 IMPLANT
PIN HALF 150X50 SD SHOOTZ STRL (Pin) ×2 IMPLANT
PINS SHOOTZ (Pin) ×4 IMPLANT
ROD CARBON (EXFIX) ×2
ROD CNCT 400X11XNS LF HFMN (EXFIX) ×2 IMPLANT
ROD HOFFMANN3 CONNECT 11X350 (EXFIX) ×4 IMPLANT
ROD TO ROD COUPLING EXFIX (EXFIX) ×12 IMPLANT
SET HNDPC FAN SPRY TIP SCT (DISPOSABLE) IMPLANT
SOL PREP POV-IOD 4OZ 10% (MISCELLANEOUS) ×2 IMPLANT
SOL PREP PROV IODINE SCRUB 4OZ (MISCELLANEOUS) ×2 IMPLANT
SPONGE T-LAP 18X18 ~~LOC~~+RFID (SPONGE) ×4 IMPLANT
STAPLER VISISTAT 35W (STAPLE) IMPLANT
STOCKINETTE IMPERVIOUS 9X36 MD (GAUZE/BANDAGES/DRESSINGS) IMPLANT
STOCKINETTE IMPERVIOUS LG (DRAPES) ×2 IMPLANT
STRIP CLOSURE SKIN 1/2X4 (GAUZE/BANDAGES/DRESSINGS) IMPLANT
SUT ETHILON 3 0 PS 1 (SUTURE) ×4 IMPLANT
SUT PDS AB 2-0 CT1 27 (SUTURE) IMPLANT
TOWEL GREEN STERILE (TOWEL DISPOSABLE) ×4 IMPLANT
TOWEL GREEN STERILE FF (TOWEL DISPOSABLE) ×4 IMPLANT
TUBE CONNECTING 12X1/4 (SUCTIONS) ×2 IMPLANT
UNDERPAD 30X36 HEAVY ABSORB (UNDERPADS AND DIAPERS) ×2 IMPLANT
WATER STERILE IRR 1000ML POUR (IV SOLUTION) ×2 IMPLANT
YANKAUER SUCT BULB TIP NO VENT (SUCTIONS) ×2 IMPLANT
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
## 2021-03-28 NOTE — Procedures (Signed)
Central line  Date/Time: 03/28/2021 10:52 AM Performed by: Axel Filler, MD Authorized by: Axel Filler, MD   Consent:    Consent obtained:  Emergent situation   Alternatives discussed:  No treatment Universal protocol:    Patient identity confirmed:  Hospital-assigned identification number Pre-procedure details:    Indication(s): central venous access and insufficient peripheral access     Hand hygiene: Hand hygiene performed prior to insertion     Sterile barrier technique: All elements of maximal sterile technique followed     Skin preparation:  Chlorhexidine Sedation:    Sedation type:  Deep Anesthesia:    Anesthesia method:  Local infiltration   Local anesthetic:  Lidocaine 1% WITH epi Procedure details:    Location:  L subclavian   Procedural supplies:  Triple lumen   Ultrasound guidance: no     Number of attempts:  2   Successful placement: yes   Post-procedure details:    Post-procedure:  Dressing applied   Assessment:  Blood return through all ports and free fluid flow   Procedure completion:  Tolerated Comments:     Awaiting CXR

## 2021-03-28 NOTE — Anesthesia Postprocedure Evaluation (Signed)
Anesthesia Post Note  Patient: Jerome [name]  Procedure(s) Performed: IRRIGATION AND DEBRIDEMENT EXTREMITY (Right) EXTERNAL FIXATION LEG (Right)     Patient location during evaluation: SICU Anesthesia Type: General Level of consciousness: sedated Pain management: pain level controlled Vital Signs Assessment: post-procedure vital signs reviewed and stable Respiratory status: patient remains intubated per anesthesia plan Cardiovascular status: stable Postop Assessment: no apparent nausea or vomiting Anesthetic complications: no   No notable events documented.  Last Vitals:  Vitals:   03/28/21 0115 03/28/21 0343  BP: 103/73   Pulse:    Resp: 15 16  Temp:  36.8 C  SpO2:      Last Pain:  Vitals:   03/28/21 0343  TempSrc: Oral                 Jerome [name]

## 2021-03-28 NOTE — Progress Notes (Signed)
Orthopedic Tech Progress Note Patient Details:  Jerome [name] 06/27/1874 [id number]  Ortho Devices Type of Ortho Device: Post (long leg) splint, Stirrup splint Ortho Device/Splint Location: rle. Ortho Device/Splint Interventions: Ordered, Adjustment, Application  I applied a posterior long leg with stirrups trauma splint. Post Interventions Patient Tolerated: Well Instructions Provided: Care of device, Adjustment of device  [name] 03/28/2021, 12:35 AM
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
## 2021-03-28 NOTE — Op Note (Signed)
*   No surgery found *  12:33 AM  PATIENT:  Jerome [name]  60 y.o. male  No care team member to display  PRE-OPERATIVE DIAGNOSIS:  right scalp laceration/wound  POST-OPERATIVE DIAGNOSIS:  same  PROCEDURE:  closure of right scalp laceration - 15 cm  SURGEON:  Stephanie Coup. Jorma Tassinari, MD  DESCRIPTION: Emergency consent was implied. The right scalp laceration which has been bleeding was cleaned with sterile saline. The area was rinsed with betadine and then irrigated. This was closed with skin staples. This is ~15 in length, degloving/island of free flap scalp/skin. This was then covered in 4x4s and a pressure dressing applied. He was under sedation following intubation and tolerated the procedure well.

## 2021-03-28 NOTE — Anesthesia Procedure Notes (Signed)
Arterial Line Insertion Start/End10/07/2020 2:13 AM, 03/28/2021 2:15 AM Performed by: [name], MD, anesthesiologist  Patient location: Pre-op. Preanesthetic checklist: patient identified, IV checked, site marked, risks and benefits discussed, surgical consent, monitors and equipment checked, pre-op evaluation, timeout performed and anesthesia consent Lidocaine 1% used for infiltration Left, radial was placed Catheter size: 20 G Hand hygiene performed  and maximum sterile barriers used   Attempts: 1 Procedure performed without using ultrasound guided technique. Following insertion, dressing applied and Biopatch. Post procedure assessment: normal and unchanged  Patient tolerated the procedure well with no immediate complications.

## 2021-03-28 NOTE — ED Notes (Signed)
Staples placed to laceration to R side of head

## 2021-03-28 NOTE — Progress Notes (Signed)
Aware of patient. Consultation to follow tomorrow am.  Provisional plan for OR tom is repeat I&D of right open tibia, repair of right forearm with delayed repair of the right proximal humerus and tibia. Will reevaluate as conditions warrant.  Myrene Galas, MD [facility], [facility] [phone]
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
## 2021-03-28 NOTE — Transfer of Care (Signed)
Immediate Anesthesia Transfer of Care Note  Patient: Jerome [name]  Procedure(s) Performed: IRRIGATION AND DEBRIDEMENT EXTREMITY (Right) EXTERNAL FIXATION LEG (Right)  Patient Location: ICU  Anesthesia Type:General  Level of Consciousness: Patient remains intubated per anesthesia plan  Airway & Oxygen Therapy: Patient remains intubated per anesthesia plan and Patient placed on Ventilator (see vital sign flow sheet for setting)  Post-op Assessment: Report given to RN and Post -op Vital signs reviewed and stable  Post vital signs: Reviewed and stable  Last Vitals:  Vitals Value Taken Time  BP 142/89 03/28/21 0335  Temp    Pulse    Resp 16 03/28/21 0341  SpO2    Vitals shown include unvalidated device data.  Last Pain: There were no vitals filed for this visit.       Complications: No notable events documented.

## 2021-03-28 NOTE — Progress Notes (Signed)
Subjective: Day of Surgery Procedure(s) (LRB): IRRIGATION AND DEBRIDEMENT EXTREMITY (Right) EXTERNAL FIXATION LEG (Right) Patient reports pain as  unable to report .    Objective: Vital signs in last 24 hours: Temp:  [95.7 F (35.4 C)-98.3 F (36.8 C)] 97.9 F (36.6 C) (10/02 1200) Pulse Rate:  [79-121] 96 (10/02 1300) Resp:  [12-21] 14 (10/02 1300) BP: (50-142)/(33-96) 113/76 (10/02 1300) SpO2:  [99 %-100 %] 100 % (10/02 1300) Arterial Line BP: (68-127)/(42-68) 109/67 (10/02 1300) FiO2 (%):  [30 %-100 %] 30 % (10/02 1109) Weight:  [68 kg] 68 kg (10/02 0006)  Intake/Output from previous day: 10/01 0701 - 10/02 0700 In: 8170.1 [I.V.:6175.1; Blood:1545; IV Piggyback:150] Out: 1000 [Urine:500; Emesis/NG output:200; Blood:300] Intake/Output this shift: Total I/O In: 1614.2 [I.V.:1260.8; Blood:158.3; IV Piggyback:195] Out: 325 [Urine:325]  Recent Labs    03/28/21 0217 03/28/21 0259 03/28/21 0432 03/28/21 0434 03/28/21 0928  HGB 10.2* 11.9* 9.5* 10.6* 10.7*   Recent Labs    03/28/21 0434 03/28/21 0928  WBC 13.0* 10.8*  RBC 3.62* 3.69*  HCT 31.0* 30.9*  PLT 73* 69*   Recent Labs    03/27/21 2238 03/27/21 2357 03/28/21 0039 03/28/21 0334 03/28/21 0432  NA 134* 138   < > 139 143  K 3.8 3.6   < > 4.0 3.8  CL 106 103  --  109  --   CO2 17*  --   --  20*  --   BUN 11 10  --  10  --   CREATININE 1.03 1.40*  --  0.85  --   GLUCOSE 189* 169*  --  177*  --   CALCIUM 6.7*  --   --  7.3*  --    < > = values in this interval not displayed.   Recent Labs    03/27/21 2238  INR 1.4*    Intact pulses distally Dressing right lower extremity has minimal bleed through posterior, dressing otherwise intact   Assessment/Plan: Day of Surgery Procedure(s) (LRB): IRRIGATION AND DEBRIDEMENT EXTREMITY (Right) EXTERNAL FIXATION LEG (Right)   Post op recs: WB: Nonweightbearing right lower extremity, NWB RUE Abx: ancef per open fracture protocol Imaging:repeat R tib/fib  portable xrays DVT prophylaxis: per primary Dressing: Keep intact Follow-up: Plan for return to OR likely later this week for removal of external fixator and open reduction internal fixation  Splint applied to Right upper extremity as per ortho tech   BLAIR Reigan Tolliver 03/28/2021, 1:25 PM

## 2021-03-28 NOTE — Anesthesia Preprocedure Evaluation (Addendum)
Anesthesia Evaluation  Patient identified by MRN, date of birth, ID band  Reviewed: Unable to perform ROS - Chart review onlyPreop documentation limited or incomplete due to emergent nature of procedure.  Airway Mallampati: Intubated       Dental  (+) Teeth Intact   Pulmonary    breath sounds clear to auscultation       Cardiovascular  Rhythm:Regular Rate:Tachycardia     Neuro/Psych    GI/Hepatic   Endo/Other    Renal/GU      Musculoskeletal   Abdominal Normal abdominal exam  (+)   Peds  Hematology   Anesthesia Other Findings   Reproductive/Obstetrics                             Anesthesia Physical Anesthesia Plan  ASA: 4 and emergent  Anesthesia Plan: General   Post-op Pain Management:    Induction: Inhalational  PONV Risk Score and Plan: 2 and Treatment may vary due to age or medical condition and Ondansetron  Airway Management Planned:   Additional Equipment: Arterial line  Intra-op Plan:   Post-operative Plan: Post-operative intubation/ventilation  Informed Consent:     History available from chart only and Only emergency history available  Plan Discussed with: CRNA  Anesthesia Plan Comments:         Anesthesia Quick Evaluation
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
## 2021-03-28 NOTE — Progress Notes (Signed)
While turning patient for pad changes pt self-extubated. Sedation immediately stopped, RT paged and in the room, MD paged. Pt was 100% on RA and able to tell me his name and date of birth. He follows commands. RT concerned patient will not be able to protect his airway. He has little to no cough ability. Anesthesia arrived at bedside to reintubate and felt like pt was doing well enough on his own. They have spoken with Dr. Derrell Lolling. Plan for now is to watch pt and see how he does over the next hour. If he is declining team will re-tube. Ellora Varnum, [place] Scrape, RN

## 2021-03-28 NOTE — ED Notes (Signed)
Pt drops BP when on sedation, pt off sedation, pt attempts to sit up but follows commands

## 2021-03-28 NOTE — Op Note (Addendum)
03/27/2021 - 03/28/2021  3:32 AM  PATIENT:  Jerome [name]    PRE-OPERATIVE DIAGNOSIS:  RIGHT OPEN TIB FIB FX  POST-OPERATIVE DIAGNOSIS:  Type 3 open right tib fib fracture (high energy mechanism and highly comminuted segmental fracture)  PROCEDURE:  IRRIGATION AND DEBRIDEMENT EXTREMITY, EXTERNAL FIXATION LEG  SURGEON:  Deretha Ertle A Sheranda Seabrooks, MD  PHYSICIAN ASSISTANT: none  ANESTHESIA:   General  PREOPERATIVE INDICATIONS:  [name] is a  60 y.o. male with a diagnosis of RIGHT OPEN TIB FIB FX was taken to the operating room emergently.  The procedure was performed emergently given the patient's hemodynamic instability and continued bleeding from the right leg.  CT angiogram negative for a arterial injury.  Plan for irrigation debridement of the open fracture and stabilization of the fracture with an external fixator.  ESTIMATED BLOOD LOSS: 50cc  OPERATIVE IMPLANTS: Stryker external fixator   OPERATIVE FINDINGS: 1 cm posterior medial open fracture wound.  Profuse bleeding noted when dressing was taken down and a tourniquet was applied and inflated.  No gross contamination.  Successfully debrided and irrigated.  Tourniquet deflated prior to wound closure and no significant bleeding was appreciated.  OPERATIVE PROCEDURE:  Patient was brought straight to the operating room from the emergency department.  He was already intubated.  Splint dressing was removed.  Patient was transferred supine to the OR table.  2 g of Ancef were given.  The right lower extremity was prepped and draped in a sterile fashion using Betadine given the open fracture.   2 Steinmann pins were inserted in the distal femur and and distal tibia shaft anterior to posterior percutaneously.   External fixator bars were attached to the Steinmann pins and the fracture was closed reduced under fluoroscopy.  Adequate reduction was obtained through closed manipulation and the external fixator connectors were firmly  tightened.    The open fracture site was evaluated.  No gross contamination was noted.  Using a 15 blade the open fracture site was extended both proximally and distally approximately 4 cm total. the open fracture extends to bone.  No gross contamination was noted.  The open fracture was carefully debrided with a rongeur and cobb.  Non viable soft tissue was excised.  No vascular injury was appreciated.  Oozing was noted to be coming primarily from the fractured bone.  The open fracture site was irrigated with 3 L of normal saline.   1 g of vancomycin powder was placed into the open fracture site.  The open fracture site was loosely closed with 3-0 nylon.  Sterile dressing was applied with Adaptic, 4 x 4's, Webril, and gauze.  Ex-Fix pin sites were dressed with Xeroform and Kerlix.  Compartments were soft and compressible at the end of the case.  Foot was warm and well perfused with palpable DP pulse.  The patient was kept intubated and transferred to the ICU in stable condition.  Post op recs: WB: Nonweightbearing right lower extremity, NWB RUE Abx: ancef per open fracture protocol Imaging:repeat R tib/fib portable xrays DVT prophylaxis: per primary Dressing: Keep intact Follow-up: Plan for return to OR likely later this week for removal of external fixator and open reduction internal fixation   Weber Cooks, MD Orthopaedic Surgery

## 2021-03-28 NOTE — ED Notes (Signed)
Pressure dressing to R lower leg and splint placed by ortho

## 2021-03-29 ENCOUNTER — Inpatient Hospital Stay (HOSPITAL_COMMUNITY): Payer: No Typology Code available for payment source

## 2021-03-29 ENCOUNTER — Encounter (HOSPITAL_COMMUNITY): Payer: Self-pay | Admitting: Orthopedic Surgery

## 2021-03-29 LAB — PREPARE FRESH FROZEN PLASMA
Unit division: 0
Unit division: 0
Unit division: 0
Unit division: 0
Unit division: 0
Unit division: 0
Unit division: 0

## 2021-03-29 LAB — CBC
HCT: 22.1 % — ABNORMAL LOW (ref 39.0–52.0)
Hemoglobin: 7.6 g/dL — ABNORMAL LOW (ref 13.0–17.0)
MCH: 29.3 pg (ref 26.0–34.0)
MCHC: 34.4 g/dL (ref 30.0–36.0)
MCV: 85.3 fL (ref 80.0–100.0)
Platelets: 51 10*3/uL — ABNORMAL LOW (ref 150–400)
RBC: 2.59 MIL/uL — ABNORMAL LOW (ref 4.22–5.81)
RDW: 17.7 % — ABNORMAL HIGH (ref 11.5–15.5)
WBC: 6.9 10*3/uL (ref 4.0–10.5)
nRBC: 0 % (ref 0.0–0.2)

## 2021-03-29 LAB — BPAM FFP
Blood Product Expiration Date: 202210022359
Blood Product Expiration Date: 202210022359
Blood Product Expiration Date: 202210032359
Blood Product Expiration Date: 202210032359
Blood Product Expiration Date: 202210072359
Blood Product Expiration Date: 202210072359
Blood Product Expiration Date: 202210072359
Blood Product Expiration Date: 202210072359
ISSUE DATE / TIME: 202210020011
ISSUE DATE / TIME: 202210020016
ISSUE DATE / TIME: 202210020104
ISSUE DATE / TIME: 202210020104
ISSUE DATE / TIME: 202210020153
ISSUE DATE / TIME: 202210020153
ISSUE DATE / TIME: 202210020153
ISSUE DATE / TIME: 202210020153
Unit Type and Rh: 5100
Unit Type and Rh: 5100
Unit Type and Rh: 5100
Unit Type and Rh: 6200
Unit Type and Rh: 6200
Unit Type and Rh: 6200
Unit Type and Rh: 6200
Unit Type and Rh: 9500

## 2021-03-29 LAB — BASIC METABOLIC PANEL
Anion gap: 5 (ref 5–15)
BUN: 7 mg/dL (ref 6–20)
CO2: 28 mmol/L (ref 22–32)
Calcium: 7.5 mg/dL — ABNORMAL LOW (ref 8.9–10.3)
Chloride: 104 mmol/L (ref 98–111)
Creatinine, Ser: 0.75 mg/dL (ref 0.61–1.24)
GFR, Estimated: >60 mL/min (ref >60)
Glucose, Bld: 129 mg/dL — ABNORMAL HIGH (ref 70–99)
Potassium: 3.3 mmol/L — ABNORMAL LOW (ref 3.5–5.1)
Sodium: 137 mmol/L (ref 135–145)

## 2021-03-29 LAB — RAPID URINE DRUG SCREEN, HOSP PERFORMED
Amphetamines: NOT DETECTED
Barbiturates: NOT DETECTED
Benzodiazepines: NOT DETECTED
Cocaine: NOT DETECTED
Opiates: NOT DETECTED
Tetrahydrocannabinol: POSITIVE — AB

## 2021-03-29 LAB — PREPARE RBC (CROSSMATCH)

## 2021-03-29 MED ORDER — LORAZEPAM 1 MG PO TABS
1.0000 mg | ORAL_TABLET | ORAL | Status: AC | PRN
  Administered 2021-04-01: 2 mg via ORAL
  Filled 2021-03-29: qty 2

## 2021-03-29 MED ORDER — SODIUM CHLORIDE 0.9% IV SOLUTION: Freq: Once | INTRAVENOUS | Status: AC

## 2021-03-29 MED ORDER — SODIUM CHLORIDE 0.9% IV SOLUTION: Freq: Once | INTRAVENOUS | Status: DC

## 2021-03-29 MED ORDER — ADULT MULTIVITAMIN W/MINERALS CH
1.0000 | ORAL_TABLET | Freq: Every day | ORAL | Status: DC
  Administered 2021-03-29 – 2021-07-30 (×117): 1 via ORAL
  Filled 2021-03-29 (×123): qty 1

## 2021-03-29 MED ORDER — ORAL CARE MOUTH RINSE
15.0000 mL | Freq: Two times a day (BID) | OROMUCOSAL | Status: DC
  Administered 2021-03-29 – 2021-07-30 (×179): 15 mL via OROMUCOSAL

## 2021-03-29 MED ORDER — LORAZEPAM 2 MG/ML IJ SOLN
0.0000 mg | Freq: Four times a day (QID) | INTRAMUSCULAR | Status: AC
Start: 2021-03-29 — End: 2021-03-31
  Administered 2021-03-30 – 2021-03-31 (×2): 2 mg via INTRAVENOUS
  Filled 2021-03-29 (×2): qty 1

## 2021-03-29 MED ORDER — LORAZEPAM 2 MG/ML IJ SOLN: 1.0000 mg | INTRAMUSCULAR | Status: AC | PRN

## 2021-03-29 MED ORDER — POTASSIUM CHLORIDE CRYS ER 20 MEQ PO TBCR: 40.0000 meq | EXTENDED_RELEASE_TABLET | Freq: Two times a day (BID) | ORAL | Status: DC

## 2021-03-29 MED ORDER — POTASSIUM CHLORIDE 10 MEQ/50ML IV SOLN
10.0000 meq | INTRAVENOUS | Status: AC
  Administered 2021-03-29 (×6): 10 meq via INTRAVENOUS
  Filled 2021-03-29 (×5): qty 50

## 2021-03-29 MED ORDER — FOLIC ACID 1 MG PO TABS
1.0000 mg | ORAL_TABLET | Freq: Every day | ORAL | Status: DC
  Administered 2021-03-29 – 2021-07-30 (×116): 1 mg via ORAL
  Filled 2021-03-29 (×123): qty 1

## 2021-03-29 MED ORDER — LORAZEPAM 2 MG/ML IJ SOLN
1.0000 mg | INTRAMUSCULAR | Status: DC | PRN
  Administered 2021-03-29: 1 mg via INTRAVENOUS
  Administered 2021-03-31 – 2021-04-02 (×3): 2 mg via INTRAVENOUS
  Filled 2021-03-29 (×4): qty 1

## 2021-03-29 MED ORDER — LORAZEPAM 2 MG/ML IJ SOLN: 0.0000 mg | Freq: Two times a day (BID) | INTRAMUSCULAR | Status: AC

## 2021-03-29 MED ORDER — THIAMINE HCL 100 MG PO TABS
100.0000 mg | ORAL_TABLET | Freq: Every day | ORAL | Status: DC
  Administered 2021-04-01 – 2021-07-30 (×116): 100 mg via ORAL
  Filled 2021-03-29 (×121): qty 1

## 2021-03-29 MED ORDER — THIAMINE HCL 100 MG/ML IJ SOLN
100.0000 mg | Freq: Every day | INTRAMUSCULAR | Status: DC
  Administered 2021-03-29 – 2021-03-31 (×2): 100 mg via INTRAVENOUS
  Filled 2021-03-29 (×2): qty 2
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
## 2021-03-29 NOTE — Progress Notes (Signed)
1005 Pt taken to MRI. Pt given med per [facility] for pain to move to MRI board. Pt being monitored on EKG, O2, and BP while on MRI table.   1020 BP 134/71, HR 102, 92% on RA  1035 BP 128/79, HR 100, 92% on RA  1050 BP 130/79, HR 104, 96% on 2L [place]. Pt moving around in MRI, sedation given per [facility]   1100 Pt still moving around, lifting/moving right leg, MRI assistant unable to get clear images after multiple repeated attempts. Pt brought out of MRI and transferred back to bed.   1115 Pt taken to CT scan.   1140 Pt brought back to 4N30. Removed A line for primary RN. Updated RN with MRI and CT trip.

## 2021-03-29 NOTE — TOC CAGE-AID Note (Signed)
Transition of Care [facility]) - CAGE-AID Screening   Patient Details  Name: Jerome [name] MRN: [id number] Date of Birth: [birth year]  Transition of [facility]) CM/SW Contact:    Netty Sullivant C Tarpley-Carter, LCSWA Phone Number: 03/29/2021, 3:06 PM   Clinical Narrative: Pt participated in Cage-Aid.  Pt denied substance use, but uses ETOH.  Pt was offered resources.  Pt stated they did not feel that they were in need of resources at this time.  CSW will provide pt with resources for possible future use.  Olinda Nola Tarpley-Carter, MSW, LCSW-A Pronouns:  She/Her/Hers [facility]itions of Care Clinical Social Worker Direct Number:  [phone] [email or web address]  CAGE-AID Screening:    Have You Ever Felt You Ought to Cut Down on Your Drinking or Drug Use?: Yes Have People Annoyed You By [employer] Your Drinking Or Drug Use?: No Have You Felt Bad Or Guilty About Your Drinking Or Drug Use?: No Have You Ever Had a Drink or Used Drugs First Thing In The Morning to Steady Your Nerves or to Get Rid of a Hangover?: No CAGE-AID Score: 1  Substance Abuse Education Offered: Yes  Substance abuse interventions: Transport planner
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
## 2021-03-29 NOTE — Progress Notes (Signed)
Patient ID: Jerome [name], male   DOB: May 25, 1961, 60 y.o.   MRN: [id number] Follow up - Trauma Critical Care  Patient Details:    Jerome [name] is an 60 y.o. male.  Lines/tubes : CVC Triple Lumen 03/28/21 Left Subclavian (Active)  Indication for Insertion or Continuance of Line Vasoactive infusions 03/28/21 2000  Site Assessment Clean;Dry;Intact 03/28/21 2000  Proximal Lumen Status Flushed 03/28/21 2000  Medial Lumen Status Flushed 03/28/21 2000  Distal Lumen Status Flushed 03/28/21 2000  Dressing Type Transparent 03/28/21 2000  Dressing Status Clean;Intact;Dry 03/28/21 2000  Antimicrobial disc in place? Yes 03/28/21 2000  Line Care Connections checked and tightened;Zeroed and calibrated;Leveled 03/28/21 2000  Dressing Intervention New dressing 03/28/21 1055  Dressing Change Due 04/04/21 03/28/21 2000     Arterial Line 03/28/21 Left Radial (Active)  Site Assessment Clean;Dry;Intact 03/28/21 2000  Line Status Pulsatile blood flow 03/28/21 2000  Art Line Waveform Other (Comment) 03/28/21 2000  Art Line Interventions Zeroed and calibrated;Leveled;Connections checked and tightened;Flushed per protocol 03/28/21 2000  Color/Movement/Sensation Capillary refill less than 3 sec 03/28/21 2000  Dressing Type Transparent 03/28/21 2000  Dressing Status Clean;Dry 03/28/21 2000  Dressing Change Due 04/04/21 03/28/21 2000     Urethral Catheter Dola Argyle, RN Latex 16 Fr. (Active)  Indication for Insertion or Continuance of Catheter Unstable critically ill patients first 24-48 hours (See Criteria) 03/29/21 0800  Site Assessment Clean;Intact 03/29/21 0800  Catheter Maintenance Bag below level of bladder;Catheter secured;Drainage bag/tubing not touching floor;No dependent loops;Seal intact 03/29/21 0800  Collection Container Standard drainage bag 03/29/21 0800  Securement Method Securing device (Describe) 03/29/21 0800  Urinary Catheter Interventions (if applicable) Unclamped 03/29/21 0800  Output (mL)  350 mL 03/29/21 0800    Microbiology/Sepsis markers: Results for orders placed or performed during the hospital encounter of 03/27/21  Resp Panel by RT-PCR (Flu A&B, Covid) Nasopharyngeal Swab     Status: None   Collection Time: 03/28/21  1:25 AM   Specimen: Nasopharyngeal Swab; Nasopharyngeal(NP) swabs in vial transport medium  Result Value Ref Range Status   SARS Coronavirus 2 by RT PCR NEGATIVE NEGATIVE Final    Comment: (NOTE) SARS-CoV-2 target nucleic acids are NOT DETECTED.  The SARS-CoV-2 RNA is generally detectable in upper respiratory specimens during the acute phase of infection. The lowest concentration of SARS-CoV-2 viral copies this assay can detect is 138 copies/mL. A negative result does not preclude SARS-Cov-2 infection and should not be used as the sole basis for treatment or other patient management decisions. A negative result may occur with  improper specimen collection/handling, submission of specimen other than nasopharyngeal swab, presence of viral mutation(s) within the areas targeted by this assay, and inadequate number of viral copies(<138 copies/mL). A negative result must be combined with clinical observations, patient history, and epidemiological information. The expected result is Negative.  Fact Sheet for Patients:  BloggerCourse.com  Fact Sheet for Healthcare Providers:  SeriousBroker.it  This test is no t yet approved or cleared by the [place] FDA and  has been authorized for detection and/or diagnosis of SARS-CoV-2 by FDA under an Emergency Use Authorization (EUA). This EUA will remain  in effect (meaning this test can be used) for the duration of the COVID-19 declaration under Section 564(b)(1) of the Act, 21 U.S.C.section 360bbb-3(b)(1), unless the authorization is terminated  or revoked sooner.       Influenza A by PCR NEGATIVE NEGATIVE Final   Influenza B by PCR NEGATIVE NEGATIVE  Final    Comment: (NOTE) The Xpert Xpress  SARS-CoV-2/FLU/RSV plus assay is intended as an aid in the diagnosis of influenza from Nasopharyngeal swab specimens and should not be used as a sole basis for treatment. Nasal washings and aspirates are unacceptable for Xpert Xpress SARS-CoV-2/FLU/RSV testing.  Fact Sheet for Patients: BloggerCourse.com  Fact Sheet for Healthcare Providers: SeriousBroker.it  This test is not yet approved or cleared by the [place] FDA and has been authorized for detection and/or diagnosis of SARS-CoV-2 by FDA under an Emergency Use Authorization (EUA). This EUA will remain in effect (meaning this test can be used) for the duration of the COVID-19 declaration under Section 564(b)(1) of the Act, 21 U.S.C. section 360bbb-3(b)(1), unless the authorization is terminated or revoked.  Performed at [facility], [address] [place], [place] [address]   MRSA Next Gen by PCR, Nasal     Status: Abnormal   Collection Time: 03/28/21  3:33 AM   Specimen: Nasal Mucosa; Nasal Swab  Result Value Ref Range Status   MRSA by PCR Next Gen DETECTED (A) NOT DETECTED Final    Comment: RESULT CALLED TO, READ BACK BY AND VERIFIED WITH: K [name],RN@[id number]  03/28/21 MK (NOTE) The GeneXpert MRSA Assay (FDA approved for NASAL specimens only), is one component of a comprehensive MRSA colonization surveillance program. It is not intended to diagnose MRSA infection nor to guide or monitor treatment for MRSA infections. Test performance is not FDA approved in patients less than 56 years old. Performed at [facility], [address] [place], [place] [address]     Anti-infectives:  Anti-infectives (From admission, onward)    Start     Dose/Rate Route Frequency Ordered Stop   03/28/21 0600  cefTRIAXone (ROCEPHIN) 2 g in sodium chloride 0.9 % 100 mL IVPB        2 g 200 mL/hr over 30 Minutes Intravenous Every 24  hours 03/28/21 0341 03/31/21 0559   03/28/21 0308  vancomycin (VANCOCIN) powder  Status:  Discontinued          As needed 03/28/21 0308 03/28/21 0323   03/27/21 2245  ceFAZolin (ANCEF) IVPB 2g/100 mL premix        2 g 200 mL/hr over 30 Minutes Intravenous  Once 03/27/21 2242 03/28/21 0036       Best Practice/Protocols:  VTE Prophylaxis: Mechanical .  Consults: Treatment Team:  Md, Minerva Fester, MD Julio Sicks, MD Myrene Galas, MD    Studies:    Events:  Subjective:    Overnight Issues:   Objective:  Vital signs for last 24 hours: Temp:  [97.9 F (36.6 C)-98.7 F (37.1 C)] 98.1 F (36.7 C) (10/03 0800) Pulse Rate:  [79-126] 98 (10/03 0900) Resp:  [12-24] 13 (10/03 0900) BP: (86-163)/(59-113) 141/76 (10/03 0900) SpO2:  [95 %-100 %] 95 % (10/03 0900) Arterial Line BP: (75-132)/(46-100) 85/66 (10/03 0900) FiO2 (%):  [30 %] 30 % (10/02 1528) Weight:  [76.7 kg] 76.7 kg (10/03 0500)  Hemodynamic parameters for last 24 hours:    Intake/Output from previous day: 10/02 0701 - 10/03 0700 In: 4203.6 [I.V.:3650.3; Blood:158.3; IV Piggyback:395] Out: 1250 [Urine:1250]  Intake/Output this shift: Total I/O In: -  Out: 350 [Urine:350]  Vent settings for last 24 hours: Vent Mode: PRVC FiO2 (%):  [30 %] 30 % Set Rate:  [18 bmp] 18 bmp Vt Set:  [520 mL] 520 mL PEEP:  [5 cmH20] 5 cmH20 Pressure Support:  [5 cmH20] 5 cmH20 Plateau Pressure:  [8 cmH20] 8 cmH20  Physical Exam:  General: no respiratory  distress Neuro: oriented to person and that he is in the hospital HEENT/Neck: no post midline tend, no pain on AROM, collar removed Resp: clear to auscultation bilaterally CVS: RRR GI: soft, nontender, BS WNL, no r/g Extremities: ex fix RLE, palp DP  Results for orders placed or performed during the hospital encounter of 03/27/21 (from the past 24 hour(s))  BLOOD TRANSFUSION REPORT - SCANNED     Status: None   Collection Time: 03/29/21  9:27 AM   Narrative   Ordered by  an unspecified provider.    Assessment & Plan: Present on Admission: **None**    LOS: 1 day   Additional comments:I reviewed the patient's new clinical lab test results. 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 No mass effect. Scalp lac - Dr. Dutch Quint following.  No repeat imaging needed.  On Keppra.  Continue with wound care to scalp lac, I D/W NS team at bedside R proximal humerus FX - ORIF today by Dr. Carola Frost R scapula FX - per Ortho R tib/fib fx: s/p ex fix by Dr. Blanchie Dessert 10/2, eventual ORIF by Dr. Carola Frost ? L ACL injury - MRI per Dr. Carola Frost C spine cleared Acute blood loss anemia: CBC now DVT prophylaxis: Currently on hold for OR and F/U CBC Dispo: ICU, OR, TBI team therapies Critical Care Total Time*: 34 Minutes  Violeta Gelinas, MD, MPH, FACS Trauma & General Surgery Use [email or web address] to contact on call provider  03/29/2021  *Care during the described time interval was provided by me. I have reviewed this patient's available data, including medical history, events of note, physical examination and test results as part of my evaluation.
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
## 2021-03-29 NOTE — Progress Notes (Signed)
  Inpatient Rehab Admissions Coordinator :  Per therapy recommendations patient was screened for CIR candidacy by Ottie Glazier RN MSN. Patient is not yet at a level to tolerate the intensity required to pursue a CIR admit due to arousal level. Patient may have the potential to progress to become a candidate. The CIR admissions team will follow and monitor for progress and place a Rehab Consult order if felt to be appropriate. Please contact me with any questions.  Ottie Glazier RN MSN Admissions Coordinator [phone]

## 2021-03-30 ENCOUNTER — Inpatient Hospital Stay (HOSPITAL_COMMUNITY): Payer: No Typology Code available for payment source | Admitting: Anesthesiology

## 2021-03-30 ENCOUNTER — Inpatient Hospital Stay (HOSPITAL_COMMUNITY): Payer: No Typology Code available for payment source

## 2021-03-30 ENCOUNTER — Encounter (HOSPITAL_COMMUNITY): Payer: Self-pay

## 2021-03-30 ENCOUNTER — Encounter (HOSPITAL_COMMUNITY): Admission: EM | Disposition: A | Discharge status: Home / Self Care | Payer: Self-pay | Source: Home / Self Care

## 2021-03-30 HISTORY — PX: I & D EXTREMITY: SHX5045

## 2021-03-30 HISTORY — PX: ORIF RADIAL FRACTURE: SHX5113

## 2021-03-30 HISTORY — PX: ORIF HUMERUS FRACTURE: SHX2126

## 2021-03-30 LAB — PREPARE RBC (CROSSMATCH)

## 2021-03-30 LAB — HEMOGLOBIN AND HEMATOCRIT, BLOOD
HCT: 29.6 % — ABNORMAL LOW (ref 39.0–52.0)
Hemoglobin: 10.1 g/dL — ABNORMAL LOW (ref 13.0–17.0)

## 2021-03-30 LAB — BASIC METABOLIC PANEL
Anion gap: 6 (ref 5–15)
BUN: 8 mg/dL (ref 6–20)
CO2: 27 mmol/L (ref 22–32)
Calcium: 7.9 mg/dL — ABNORMAL LOW (ref 8.9–10.3)
Chloride: 102 mmol/L (ref 98–111)
Creatinine, Ser: 0.68 mg/dL (ref 0.61–1.24)
GFR, Estimated: >60 mL/min (ref >60)
Glucose, Bld: 113 mg/dL — ABNORMAL HIGH (ref 70–99)
Potassium: 3.6 mmol/L (ref 3.5–5.1)
Sodium: 135 mmol/L (ref 135–145)

## 2021-03-30 LAB — VITAMIN D 25 HYDROXY (VIT D DEFICIENCY, FRACTURES): Vit D, 25-Hydroxy: 23.11 ng/mL — ABNORMAL LOW (ref 30–100)

## 2021-03-30 LAB — PROTIME-INR
INR: 1 (ref 0.8–1.2)
Prothrombin Time: 12.7 seconds (ref 11.4–15.2)

## 2021-03-30 LAB — CBC
HCT: 26.6 % — ABNORMAL LOW (ref 39.0–52.0)
Hemoglobin: 9.1 g/dL — ABNORMAL LOW (ref 13.0–17.0)
MCH: 29 pg (ref 26.0–34.0)
MCHC: 34.2 g/dL (ref 30.0–36.0)
MCV: 84.7 fL (ref 80.0–100.0)
Platelets: 52 10*3/uL — ABNORMAL LOW (ref 150–400)
RBC: 3.14 MIL/uL — ABNORMAL LOW (ref 4.22–5.81)
RDW: 16.6 % — ABNORMAL HIGH (ref 11.5–15.5)
WBC: 5.6 10*3/uL (ref 4.0–10.5)
nRBC: 0 % (ref 0.0–0.2)

## 2021-03-30 LAB — APTT: aPTT: 31 seconds (ref 24–36)

## 2021-03-30 SURGERY — IRRIGATION AND DEBRIDEMENT EXTREMITY
Anesthesia: General | Site: Arm Upper | Laterality: Right

## 2021-03-30 MED ORDER — FENTANYL CITRATE (PF) 250 MCG/5ML IJ SOLN
INTRAMUSCULAR | Status: AC
  Filled 2021-03-30: qty 5

## 2021-03-30 MED ORDER — VANCOMYCIN HCL 1000 MG IV SOLR
INTRAVENOUS | Status: AC
  Filled 2021-03-30: qty 20

## 2021-03-30 MED ORDER — DEXAMETHASONE SODIUM PHOSPHATE 10 MG/ML IJ SOLN
INTRAMUSCULAR | Status: DC | PRN
Start: 2021-03-30 — End: 2021-03-30
  Administered 2021-03-30: 8 mg via INTRAVENOUS

## 2021-03-30 MED ORDER — FENTANYL CITRATE (PF) 100 MCG/2ML IJ SOLN
INTRAMUSCULAR | Status: AC
  Filled 2021-03-30: qty 2

## 2021-03-30 MED ORDER — ROCURONIUM BROMIDE 10 MG/ML (PF) SYRINGE
PREFILLED_SYRINGE | INTRAVENOUS | Status: AC
  Filled 2021-03-30: qty 20

## 2021-03-30 MED ORDER — OXYCODONE HCL 5 MG/5ML PO SOLN: 5.0000 mg | Freq: Once | ORAL | Status: DC | PRN

## 2021-03-30 MED ORDER — VANCOMYCIN HCL 1000 MG IV SOLR
INTRAVENOUS | Status: DC | PRN
  Administered 2021-03-30: 1000 mg

## 2021-03-30 MED ORDER — TOBRAMYCIN SULFATE 1.2 G IJ SOLR
INTRAMUSCULAR | Status: AC
  Filled 2021-03-30: qty 1.2

## 2021-03-30 MED ORDER — DROPERIDOL 2.5 MG/ML IJ SOLN: 0.6250 mg | Freq: Once | INTRAMUSCULAR | Status: DC | PRN

## 2021-03-30 MED ORDER — PROPOFOL 10 MG/ML IV BOLUS
INTRAVENOUS | Status: DC | PRN
  Administered 2021-03-30: 140 mg via INTRAVENOUS

## 2021-03-30 MED ORDER — MIDAZOLAM HCL 2 MG/2ML IJ SOLN
INTRAMUSCULAR | Status: AC
  Filled 2021-03-30: qty 2

## 2021-03-30 MED ORDER — PHENYLEPHRINE 40 MCG/ML (10ML) SYRINGE FOR IV PUSH (FOR BLOOD PRESSURE SUPPORT)
PREFILLED_SYRINGE | INTRAVENOUS | Status: DC | PRN
  Administered 2021-03-30: 80 ug via INTRAVENOUS

## 2021-03-30 MED ORDER — PHENYLEPHRINE 40 MCG/ML (10ML) SYRINGE FOR IV PUSH (FOR BLOOD PRESSURE SUPPORT)
PREFILLED_SYRINGE | INTRAVENOUS | Status: AC
  Filled 2021-03-30: qty 10

## 2021-03-30 MED ORDER — LIDOCAINE 2% (20 MG/ML) 5 ML SYRINGE
INTRAMUSCULAR | Status: AC
  Filled 2021-03-30: qty 5

## 2021-03-30 MED ORDER — FENTANYL CITRATE (PF) 100 MCG/2ML IJ SOLN
25.0000 ug | INTRAMUSCULAR | Status: DC | PRN
  Administered 2021-03-30 (×2): 50 ug via INTRAVENOUS

## 2021-03-30 MED ORDER — PROPOFOL 10 MG/ML IV BOLUS
INTRAVENOUS | Status: AC
  Filled 2021-03-30: qty 20

## 2021-03-30 MED ORDER — PHENYLEPHRINE HCL-NACL 20-0.9 MG/250ML-% IV SOLN
INTRAVENOUS | Status: DC | PRN
  Administered 2021-03-30: 35 ug/min via INTRAVENOUS

## 2021-03-30 MED ORDER — ONDANSETRON HCL 4 MG/2ML IJ SOLN: 4.0000 mg | Freq: Once | INTRAMUSCULAR | Status: DC | PRN

## 2021-03-30 MED ORDER — SODIUM CHLORIDE 0.9% IV SOLUTION: Freq: Once | INTRAVENOUS | Status: DC

## 2021-03-30 MED ORDER — OXYCODONE HCL 5 MG PO TABS: 5.0000 mg | ORAL_TABLET | Freq: Once | ORAL | Status: DC | PRN

## 2021-03-30 MED ORDER — LIDOCAINE 2% (20 MG/ML) 5 ML SYRINGE
INTRAMUSCULAR | Status: DC | PRN
  Administered 2021-03-30: 80 mg via INTRAVENOUS

## 2021-03-30 MED ORDER — DEXAMETHASONE SODIUM PHOSPHATE 10 MG/ML IJ SOLN
INTRAMUSCULAR | Status: AC
  Filled 2021-03-30: qty 1

## 2021-03-30 MED ORDER — FENTANYL CITRATE (PF) 250 MCG/5ML IJ SOLN
INTRAMUSCULAR | Status: DC | PRN
  Administered 2021-03-30 (×2): 50 ug via INTRAVENOUS
  Administered 2021-03-30: 100 ug via INTRAVENOUS
  Administered 2021-03-30: 50 ug via INTRAVENOUS
  Administered 2021-03-30: 100 ug via INTRAVENOUS
  Administered 2021-03-30: 50 ug via INTRAVENOUS

## 2021-03-30 MED ORDER — CHLORHEXIDINE GLUCONATE 0.12 % MT SOLN
OROMUCOSAL | Status: AC
  Administered 2021-03-30: 15 mL via OROMUCOSAL
  Filled 2021-03-30: qty 15

## 2021-03-30 MED ORDER — 0.9 % SODIUM CHLORIDE (POUR BTL) OPTIME
TOPICAL | Status: DC | PRN
  Administered 2021-03-30: 1000 mL

## 2021-03-30 MED ORDER — SUGAMMADEX SODIUM 200 MG/2ML IV SOLN
INTRAVENOUS | Status: DC | PRN
  Administered 2021-03-30: 200 mg via INTRAVENOUS

## 2021-03-30 MED ORDER — SODIUM CHLORIDE 0.9 % IR SOLN
Status: DC | PRN
  Administered 2021-03-30: 3000 mL

## 2021-03-30 MED ORDER — MIDAZOLAM HCL 2 MG/2ML IJ SOLN
INTRAMUSCULAR | Status: DC | PRN
  Administered 2021-03-30: 2 mg via INTRAVENOUS

## 2021-03-30 MED ORDER — LACTATED RINGERS IV SOLN: INTRAVENOUS | Status: DC

## 2021-03-30 MED ORDER — CHLORHEXIDINE GLUCONATE 0.12 % MT SOLN: 15.0000 mL | Freq: Once | OROMUCOSAL | Status: AC

## 2021-03-30 MED ORDER — ONDANSETRON HCL 4 MG/2ML IJ SOLN
INTRAMUSCULAR | Status: DC | PRN
  Administered 2021-03-30: 4 mg via INTRAVENOUS

## 2021-03-30 MED ORDER — ONDANSETRON HCL 4 MG/2ML IJ SOLN
INTRAMUSCULAR | Status: AC
  Filled 2021-03-30: qty 2

## 2021-03-30 MED ORDER — SODIUM CHLORIDE 0.9 % IV SOLN
2.0000 g | INTRAVENOUS | Status: AC
  Administered 2021-03-31 – 2021-04-02 (×3): 2 g via INTRAVENOUS
  Filled 2021-03-30 (×4): qty 20

## 2021-03-30 MED ORDER — ROCURONIUM BROMIDE 10 MG/ML (PF) SYRINGE
PREFILLED_SYRINGE | INTRAVENOUS | Status: DC | PRN
  Administered 2021-03-30: 70 mg via INTRAVENOUS
  Administered 2021-03-30: 30 mg via INTRAVENOUS
  Administered 2021-03-30: 20 mg via INTRAVENOUS
  Administered 2021-03-30: 15 mg via INTRAVENOUS

## 2021-03-30 SURGICAL SUPPLY — 106 items
BAG COUNTER SPONGE SURGICOUNT (BAG) ×3 IMPLANT
BIT DRILL 2.8X5 QR DISP (BIT) ×3 IMPLANT
BIT DRILL 3.0X5 QUICK RELEASE (DRILL) ×2 IMPLANT
BIT DRILL 3.2 (BIT) ×1
BIT DRILL 3.2XCALB NS DISP (BIT) ×2 IMPLANT
BIT DRILL CALIBRATED 2.7 (BIT) ×3 IMPLANT
BIT DRILL QUICK RELEASE 3.5MM (BIT) ×2 IMPLANT
BIT DRL 3.2XCALB NS DISP (BIT) ×2
BLADE CLIPPER SURG (BLADE) IMPLANT
BNDG COHESIVE 4X5 TAN STRL (GAUZE/BANDAGES/DRESSINGS) ×3 IMPLANT
BNDG ELASTIC 4X5.8 VLCR STR LF (GAUZE/BANDAGES/DRESSINGS) ×3 IMPLANT
BNDG ELASTIC 6X10 VLCR STRL LF (GAUZE/BANDAGES/DRESSINGS) ×6 IMPLANT
BNDG ESMARK 4X9 LF (GAUZE/BANDAGES/DRESSINGS) ×3 IMPLANT
BNDG GAUZE ELAST 4 BULKY (GAUZE/BANDAGES/DRESSINGS) ×6 IMPLANT
BRUSH SCRUB EZ PLAIN DRY (MISCELLANEOUS) ×6 IMPLANT
CEMENT BONE REFOBACIN R1X40 US (Cement) ×3 IMPLANT
CEMENT MIXING SYSTEM 3DOSE (KITS) ×3 IMPLANT
COVER MAYO STAND REUSABLE (DRAPES) ×6 IMPLANT
COVER SURGICAL LIGHT HANDLE (MISCELLANEOUS) ×6 IMPLANT
DECANTER SPIKE VIAL GLASS SM (MISCELLANEOUS) IMPLANT
DRAPE C-ARM 35X43 STRL (DRAPES) ×3 IMPLANT
DRAPE C-ARM 42X72 X-RAY (DRAPES) ×3 IMPLANT
DRAPE HALF SHEET 70X43 (DRAPES) ×3 IMPLANT
DRAPE ORTHO SPLIT 77X108 STRL (DRAPES) ×2
DRAPE SURG ORHT 6 SPLT 77X108 (DRAPES) ×4 IMPLANT
DRAPE U-SHAPE 47X51 STRL (DRAPES) ×3 IMPLANT
DRILL 3.0X5 QUICK RELEASE (DRILL) ×3
DRILL QUICK RELEASE 3.5MM (BIT) ×3
DRSG EMULSION OIL 3X3 NADH (GAUZE/BANDAGES/DRESSINGS) ×3 IMPLANT
DRSG MEPILEX BORDER 4X8 (GAUZE/BANDAGES/DRESSINGS) ×3 IMPLANT
DRSG MEPITEL 4X7.2 (GAUZE/BANDAGES/DRESSINGS) ×3 IMPLANT
DRSG MEPITEL 8X12 (GAUZE/BANDAGES/DRESSINGS) ×3 IMPLANT
DRSG PAD ABDOMINAL 8X10 ST (GAUZE/BANDAGES/DRESSINGS) ×6 IMPLANT
ELECT REM PT RETURN 9FT ADLT (ELECTROSURGICAL) ×3
ELECTRODE REM PT RTRN 9FT ADLT (ELECTROSURGICAL) ×2 IMPLANT
GAUZE SPONGE 4X4 12PLY STRL (GAUZE/BANDAGES/DRESSINGS) ×3 IMPLANT
GAUZE SPONGE 4X4 12PLY STRL LF (GAUZE/BANDAGES/DRESSINGS) ×3 IMPLANT
GAUZE XEROFORM 5X9 LF (GAUZE/BANDAGES/DRESSINGS) ×3 IMPLANT
GLOVE SRG 8 PF TXTR STRL LF DI (GLOVE) ×2 IMPLANT
GLOVE SURG ENC MOIS LTX SZ7.5 (GLOVE) ×3 IMPLANT
GLOVE SURG ENC MOIS LTX SZ8 (GLOVE) ×3 IMPLANT
GLOVE SURG UNDER POLY LF SZ7.5 (GLOVE) ×3 IMPLANT
GLOVE SURG UNDER POLY LF SZ8 (GLOVE) ×1
GLOVE SURG UNDER POLY LF SZ9 (GLOVE) ×3 IMPLANT
GOWN STRL REUS W/ TWL LRG LVL3 (GOWN DISPOSABLE) ×4 IMPLANT
GOWN STRL REUS W/ TWL XL LVL3 (GOWN DISPOSABLE) ×2 IMPLANT
GOWN STRL REUS W/TWL LRG LVL3 (GOWN DISPOSABLE) ×2
GOWN STRL REUS W/TWL XL LVL3 (GOWN DISPOSABLE) ×1
HANDPIECE INTERPULSE COAX TIP (DISPOSABLE) ×1
K-WIRE 2X5 SS THRDED S3 (WIRE) ×9
KIT BASIN OR (CUSTOM PROCEDURE TRAY) ×3 IMPLANT
KIT TURNOVER KIT B (KITS) ×3 IMPLANT
KWIRE 2X5 SS THRDED S3 (WIRE) ×6 IMPLANT
MANIFOLD NEPTUNE II (INSTRUMENTS) ×3 IMPLANT
NEEDLE HYPO 25GX1X1/2 BEV (NEEDLE) IMPLANT
NS IRRIG 1000ML POUR BTL (IV SOLUTION) ×3 IMPLANT
PACK GENERAL/GYN (CUSTOM PROCEDURE TRAY) ×3 IMPLANT
PACK ORTHO EXTREMITY (CUSTOM PROCEDURE TRAY) ×3 IMPLANT
PAD ARMBOARD 7.5X6 YLW CONV (MISCELLANEOUS) ×6 IMPLANT
PAD CAST 3X4 CTTN HI CHSV (CAST SUPPLIES) ×2 IMPLANT
PAD CAST 4YDX4 CTTN HI CHSV (CAST SUPPLIES) ×2 IMPLANT
PADDING CAST ABS 4INX4YD NS (CAST SUPPLIES) ×1
PADDING CAST ABS COTTON 4X4 ST (CAST SUPPLIES) ×2 IMPLANT
PADDING CAST COTTON 3X4 STRL (CAST SUPPLIES) ×1
PADDING CAST COTTON 4X4 STRL (CAST SUPPLIES) ×1
PADDING CAST COTTON 6X4 STRL (CAST SUPPLIES) ×3 IMPLANT
PADDING CAST SYNTHETIC 3 NS LF (CAST SUPPLIES) ×1
PADDING CAST SYNTHETIC 3X4 NS (CAST SUPPLIES) ×2 IMPLANT
PEG LOCKING 3.2MMX46 (Peg) ×3 IMPLANT
PEG LOCKING 3.2MMX65 (Peg) ×3 IMPLANT
PEG LOCKING 3.2X36 (Screw) ×3 IMPLANT
PEG LOCKING 3.2X38 (Screw) ×3 IMPLANT
PEG LOCKING 3.2X42 (Screw) ×3 IMPLANT
PEG LOCKING 3.2X50 (Screw) ×3 IMPLANT
PEG LOCKING 3.2X52 (Peg) ×3 IMPLANT
PEG LOCKING 3.2X58MM (Peg) ×3 IMPLANT
PLATE 8 HOLE RADIUS (Plate) ×3 IMPLANT
PLATE PROX HUM HI R 4H 90 (Plate) ×3 IMPLANT
SCREW CORTICAL LOW PROF 3.5X32 (Screw) ×3 IMPLANT
SCREW CORTICAL LOW PROF 3.5X34 (Screw) ×6 IMPLANT
SCREW HEXALOBE NON-LOCK 3.5X14 (Screw) ×6 IMPLANT
SCREW HEXALOBE NON-LOCK 3.5X16 (Screw) ×15 IMPLANT
SCREW LOCK CORT STAR 3.5X32 (Screw) ×3 IMPLANT
SCREW LOCK CORT STAR 3.5X48 (Screw) ×3 IMPLANT
SCREW T15 LP CORT 3.5X36MM NS (Screw) ×3 IMPLANT
SET HNDPC FAN SPRY TIP SCT (DISPOSABLE) ×2 IMPLANT
SLING ARM FOAM STRAP XLG (SOFTGOODS) ×3 IMPLANT
SOL PREP POV-IOD 4OZ 10% (MISCELLANEOUS) ×3 IMPLANT
SOL PREP PROV IODINE SCRUB 4OZ (MISCELLANEOUS) ×3 IMPLANT
SPONGE T-LAP 18X18 ~~LOC~~+RFID (SPONGE) ×6 IMPLANT
STOCKINETTE IMPERVIOUS 9X36 MD (GAUZE/BANDAGES/DRESSINGS) IMPLANT
SUT ETHILON 2 0 FS 18 (SUTURE) ×6 IMPLANT
SUT ETHILON 3 0 PS 1 (SUTURE) ×9 IMPLANT
SUT PDS AB 2-0 CT1 27 (SUTURE) ×3 IMPLANT
SUT VIC AB 0 CT3 27 (SUTURE) ×3 IMPLANT
SUT VIC AB 1 CT1 27 (SUTURE)
SUT VIC AB 1 CT1 27XBRD ANTBC (SUTURE) IMPLANT
SUT VIC AB 2-0 CT1 27 (SUTURE) ×1
SUT VIC AB 2-0 CT1 TAPERPNT 27 (SUTURE) ×2 IMPLANT
SYR CONTROL 10ML LL (SYRINGE) IMPLANT
TOWEL GREEN STERILE (TOWEL DISPOSABLE) ×6 IMPLANT
TOWEL GREEN STERILE FF (TOWEL DISPOSABLE) ×3 IMPLANT
TUBE CONNECTING 12X1/4 (SUCTIONS) ×3 IMPLANT
UNDERPAD 30X36 HEAVY ABSORB (UNDERPADS AND DIAPERS) ×3 IMPLANT
WATER STERILE IRR 1000ML POUR (IV SOLUTION) ×3 IMPLANT
YANKAUER SUCT BULB TIP NO VENT (SUCTIONS) ×3 IMPLANT
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
## 2021-03-30 NOTE — Anesthesia Procedure Notes (Signed)
Procedure Name: Intubation Date/Time: 03/30/2021 8:35 AM Performed by: Inda Coke, CRNA Pre-anesthesia Checklist: Patient identified, Emergency Drugs available, Suction available and Patient being monitored Patient Re-evaluated:Patient Re-evaluated prior to induction Oxygen Delivery Method: Circle System Utilized Preoxygenation: Pre-oxygenation with 100% oxygen Induction Type: IV induction Ventilation: Mask ventilation without difficulty Laryngoscope Size: Mac and 4 Grade View: Grade I Tube type: Oral Number of attempts: 1 Airway Equipment and Method: Stylet and Oral airway Placement Confirmation: ETT inserted through vocal cords under direct vision, positive ETCO2 and breath sounds checked- equal and bilateral Secured at: 22 cm Tube secured with: Tape Dental Injury: Teeth and Oropharynx as per pre-operative assessment
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
## 2021-03-30 NOTE — Progress Notes (Addendum)
Patient reports that sister Dispensing optician) lives in [place], [place]. He does not know her phone number and no contact information was located among his belongings.

## 2021-03-30 NOTE — Transfer of Care (Signed)
Immediate Anesthesia Transfer of Care Note  Patient: Jerome [name]  Procedure(s) Performed: REPEAT IRRIGATION AND DEBRIDEMENT RIGHT TIBIA AND EXTERNAL FIXATOR ADJUSTMENT, INSERTION OF ANTIBIOTIC SPACER (Right) OPEN REDUCTION INTERNAL FIXATION (ORIF) RADIAL SHAFT FRACTURE (Right) OPEN REDUCTION INTERNAL FIXATION (ORIF) PROXIMAL HUMERUS FRACTURE (Right: Arm Upper)  Patient Location: PACU  Anesthesia Type:General  Level of Consciousness: awake and alert   Airway & Oxygen Therapy: Patient Spontanous Breathing and Patient connected to face mask oxygen  Post-op Assessment: Report given to RN and Post -op Vital signs reviewed and stable  Post vital signs: Reviewed and stable  Last Vitals:  Vitals Value Taken Time  BP 185/101 03/30/21 1436  Temp    Pulse 125 03/30/21 1436  Resp 16 03/30/21 1438  SpO2 100 % 03/30/21 1436  Vitals shown include unvalidated device data.  Last Pain:  Vitals:   03/30/21 0748  TempSrc:   PainSc: 10-Worst pain ever         Complications: No notable events documented.

## 2021-03-30 NOTE — Progress Notes (Signed)
SLP Cancellation Note  Patient Details Name: Jerome [name] MRN: [id number] DOB: January 19, 1961   Cancelled treatment:        Orders for cognitive linguistic evaluation received and appreciated.  Pt is in OR this morning.  SLP will return as schedule permits when pt is medically appropriate for assessment.   Kerrie Pleasure, MA, CCC-SLP Acute Rehabilitation Services Office: [phone] 03/30/2021, 10:11 AM

## 2021-03-30 NOTE — Progress Notes (Signed)
   Providing Compassionate, Quality Care - Together   Patient is presently in the OR having his orthopedic injuries addressed by Dr. Carola Frost. There are no new Neurosurgical recommendations at this time. I will check in on the patient tomorrow. Should any Neurosurgical needs arise in the interim please contact the office, so a provider can be paged.     Val Eagle, DNP, AGNP-C Nurse Practitioner  [facility] [address], [address], [place], [place] [address] P: [phone]    F: [phone]   03/30/2021, 9:59 AM

## 2021-03-30 NOTE — Progress Notes (Signed)
OT Cancellation Note  Patient Details Name: Jerome [name] MRN: [id number] DOB: May 31, 1961   Cancelled Treatment:    Reason Eval/Treat Not Completed: Patient at procedure or test/ unavailable (OR currently)  Mateo Flow 03/30/2021, 8:17 AM  Timmothy Euler, OTR/L  Acute Rehabilitation Services Pager: [phone] Office: [phone] .

## 2021-03-31 ENCOUNTER — Encounter (HOSPITAL_COMMUNITY): Payer: Self-pay | Admitting: Orthopedic Surgery

## 2021-03-31 LAB — TYPE AND SCREEN
ABO/RH(D): O POS
Antibody Screen: NEGATIVE
Unit division: 0
Unit division: 0
Unit division: 0
Unit division: 0
Unit division: 0
Unit division: 0
Unit division: 0
Unit division: 0
Unit division: 0
Unit division: 0
Unit division: 0
Unit division: 0
Unit division: 0
Unit division: 0
Unit division: 0

## 2021-03-31 LAB — CBC
HCT: 23.3 % — ABNORMAL LOW (ref 39.0–52.0)
Hemoglobin: 8 g/dL — ABNORMAL LOW (ref 13.0–17.0)
MCH: 29.5 pg (ref 26.0–34.0)
MCHC: 34.3 g/dL (ref 30.0–36.0)
MCV: 86 fL (ref 80.0–100.0)
Platelets: 132 10*3/uL — ABNORMAL LOW (ref 150–400)
RBC: 2.71 MIL/uL — ABNORMAL LOW (ref 4.22–5.81)
RDW: 16.5 % — ABNORMAL HIGH (ref 11.5–15.5)
WBC: 7 10*3/uL (ref 4.0–10.5)
nRBC: 0.3 % — ABNORMAL HIGH (ref 0.0–0.2)

## 2021-03-31 LAB — BPAM RBC
Blood Product Expiration Date: 202210222359
Blood Product Expiration Date: 202210252359
Blood Product Expiration Date: 202210252359
Blood Product Expiration Date: 202210262359
Blood Product Expiration Date: 202210292359
Blood Product Expiration Date: 202210292359
Blood Product Expiration Date: 202210292359
Blood Product Expiration Date: 202210292359
Blood Product Expiration Date: 202210292359
Blood Product Expiration Date: 202210292359
Blood Product Expiration Date: 202210302359
Blood Product Expiration Date: 202211012359
Blood Product Expiration Date: 202211012359
Blood Product Expiration Date: 202211012359
Blood Product Expiration Date: 202211012359
ISSUE DATE / TIME: 202210012301
ISSUE DATE / TIME: 202210012348
ISSUE DATE / TIME: 202210020027
ISSUE DATE / TIME: 202210020053
ISSUE DATE / TIME: 202210020105
ISSUE DATE / TIME: 202210020113
ISSUE DATE / TIME: 202210020556
ISSUE DATE / TIME: 202210020842
ISSUE DATE / TIME: 202210021006
ISSUE DATE / TIME: 202210021848
ISSUE DATE / TIME: 202210031626
ISSUE DATE / TIME: 202210032138
ISSUE DATE / TIME: 202210040634
ISSUE DATE / TIME: 202210040818
ISSUE DATE / TIME: 202210040818
Unit Type and Rh: 5100
Unit Type and Rh: 5100
Unit Type and Rh: 5100
Unit Type and Rh: 5100
Unit Type and Rh: 5100
Unit Type and Rh: 5100
Unit Type and Rh: 5100
Unit Type and Rh: 5100
Unit Type and Rh: 5100
Unit Type and Rh: 5100
Unit Type and Rh: 5100
Unit Type and Rh: 5100
Unit Type and Rh: 5100
Unit Type and Rh: 5100
Unit Type and Rh: 5100

## 2021-03-31 LAB — BASIC METABOLIC PANEL
Anion gap: 6 (ref 5–15)
BUN: 10 mg/dL (ref 6–20)
CO2: 28 mmol/L (ref 22–32)
Calcium: 7.7 mg/dL — ABNORMAL LOW (ref 8.9–10.3)
Chloride: 102 mmol/L (ref 98–111)
Creatinine, Ser: 0.75 mg/dL (ref 0.61–1.24)
GFR, Estimated: >60 mL/min (ref >60)
Glucose, Bld: 131 mg/dL — ABNORMAL HIGH (ref 70–99)
Potassium: 3.8 mmol/L (ref 3.5–5.1)
Sodium: 136 mmol/L (ref 135–145)

## 2021-03-31 MED ORDER — VITAMIN D 25 MCG (1000 UNIT) PO TABS
2000.0000 [IU] | ORAL_TABLET | Freq: Two times a day (BID) | ORAL | Status: DC
  Administered 2021-03-31 – 2021-07-30 (×236): 2000 [IU] via ORAL
  Filled 2021-03-31 (×242): qty 2

## 2021-03-31 MED ORDER — ENOXAPARIN SODIUM 30 MG/0.3ML IJ SOSY
30.0000 mg | PREFILLED_SYRINGE | Freq: Two times a day (BID) | INTRAMUSCULAR | Status: DC
  Administered 2021-03-31 – 2021-05-04 (×65): 30 mg via SUBCUTANEOUS
  Filled 2021-03-31 (×71): qty 0.3
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
## 2021-03-31 NOTE — Anesthesia Postprocedure Evaluation (Signed)
Anesthesia Post Note  Patient: Jerome [name]  Procedure(s) Performed: REPEAT IRRIGATION AND DEBRIDEMENT RIGHT TIBIA AND EXTERNAL FIXATOR ADJUSTMENT, INSERTION OF ANTIBIOTIC SPACER (Right) OPEN REDUCTION INTERNAL FIXATION (ORIF) RADIAL SHAFT FRACTURE (Right) OPEN REDUCTION INTERNAL FIXATION (ORIF) PROXIMAL HUMERUS FRACTURE (Right: Arm Upper)     Patient location during evaluation: PACU Anesthesia Type: General Level of consciousness: awake Pain management: pain level controlled Vital Signs Assessment: post-procedure vital signs reviewed and stable Respiratory status: spontaneous breathing and respiratory function stable Cardiovascular status: stable Postop Assessment: no apparent nausea or vomiting Anesthetic complications: no   No notable events documented.  Last Vitals:  Vitals:   03/31/21 0312 03/31/21 0715  BP: 125/69 (!) 151/66  Pulse: (!) 105 (!) 124  Resp: 16 19  Temp: 37.6 C (!) 36.4 C  SpO2: 100% 96%    Last Pain:  Vitals:   03/31/21 0715  TempSrc: Oral  PainSc:    Pain Goal:                   Mellody Dance
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
## 2021-03-31 NOTE — Plan of Care (Signed)
  Problem: Safety: Goal: Non-violent Restraint(s) 03/31/2021 1244 by Mayford Knife, RN Outcome: Progressing 03/31/2021 1244 by Mayford Knife, RN Outcome: Progressing   Problem: Education: Goal: Knowledge of General Education information will improve Description: Including pain rating scale, medication(s)/side effects and non-pharmacologic comfort measures 03/31/2021 1244 by Mayford Knife, RN Outcome: Progressing 03/31/2021 1244 by Mayford Knife, RN Outcome: Progressing   Problem: Health Behavior/Discharge Planning: Goal: Ability to manage health-related needs will improve 03/31/2021 1244 by Mayford Knife, RN Outcome: Progressing 03/31/2021 1244 by Mayford Knife, RN Outcome: Progressing   Problem: Clinical Measurements: Goal: Ability to maintain clinical measurements within normal limits will improve 03/31/2021 1244 by Mayford Knife, RN Outcome: Progressing 03/31/2021 1244 by Mayford Knife, RN Outcome: Progressing Goal: Will remain free from infection 03/31/2021 1244 by Mayford Knife, RN Outcome: Progressing 03/31/2021 1244 by Mayford Knife, RN Outcome: Progressing Goal: Diagnostic test results will improve 03/31/2021 1244 by Mayford Knife, RN Outcome: Progressing 03/31/2021 1244 by Mayford Knife, RN Outcome: Progressing Goal: Respiratory complications will improve 03/31/2021 1244 by Mayford Knife, RN Outcome: Progressing 03/31/2021 1244 by Mayford Knife, RN Outcome: Progressing Goal: Cardiovascular complication will be avoided 03/31/2021 1244 by Mayford Knife, RN Outcome: Progressing 03/31/2021 1244 by Mayford Knife, RN Outcome: Progressing   Problem: Activity: Goal: Risk for activity intolerance will decrease 03/31/2021 1244 by Mayford Knife, RN Outcome: Progressing 03/31/2021 1244 by Mayford Knife, RN Outcome: Progressing   Problem: Nutrition: Goal: Adequate nutrition will be maintained 03/31/2021 1244 by Mayford Knife, RN Outcome: Progressing 03/31/2021 1244 by Mayford Knife, RN Outcome: Progressing   Problem: Coping: Goal: Level of anxiety will decrease 03/31/2021 1244 by Mayford Knife, RN Outcome: Progressing 03/31/2021 1244 by Mayford Knife, RN Outcome: Progressing   Problem: Elimination: Goal: Will not experience complications related to bowel motility 03/31/2021 1244 by Mayford Knife, RN Outcome: Progressing 03/31/2021 1244 by Mayford Knife, RN Outcome: Progressing Goal: Will not experience complications related to urinary retention 03/31/2021 1244 by Mayford Knife, RN Outcome: Progressing 03/31/2021 1244 by Mayford Knife, RN Outcome: Progressing   Problem: Pain Managment: Goal: General experience of comfort will improve 03/31/2021 1244 by Mayford Knife, RN Outcome: Progressing 03/31/2021 1244 by Mayford Knife, RN Outcome: Progressing   Problem: Safety: Goal: Ability to remain free from injury will improve 03/31/2021 1244 by Mayford Knife, RN Outcome: Progressing 03/31/2021 1244 by Mayford Knife, RN Outcome: Progressing   Problem: Skin Integrity: Goal: Risk for impaired skin integrity will decrease 03/31/2021 1244 by Mayford Knife, RN Outcome: Progressing 03/31/2021 1244 by Mayford Knife, RN Outcome: Progressing
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
## 2021-04-01 LAB — CBC
HCT: 21.4 % — ABNORMAL LOW (ref 39.0–52.0)
HCT: 23 % — ABNORMAL LOW (ref 39.0–52.0)
Hemoglobin: 7.1 g/dL — ABNORMAL LOW (ref 13.0–17.0)
Hemoglobin: 7.5 g/dL — ABNORMAL LOW (ref 13.0–17.0)
MCH: 28.9 pg (ref 26.0–34.0)
MCH: 28.7 pg (ref 26.0–34.0)
MCHC: 33.2 g/dL (ref 30.0–36.0)
MCHC: 32.6 g/dL (ref 30.0–36.0)
MCV: 87 fL (ref 80.0–100.0)
MCV: 88.1 fL (ref 80.0–100.0)
Platelets: 146 10*3/uL — ABNORMAL LOW (ref 150–400)
Platelets: 179 10*3/uL (ref 150–400)
RBC: 2.46 MIL/uL — ABNORMAL LOW (ref 4.22–5.81)
RBC: 2.61 MIL/uL — ABNORMAL LOW (ref 4.22–5.81)
RDW: 16.6 % — ABNORMAL HIGH (ref 11.5–15.5)
RDW: 16.6 % — ABNORMAL HIGH (ref 11.5–15.5)
WBC: 5.4 10*3/uL (ref 4.0–10.5)
WBC: 4.8 10*3/uL (ref 4.0–10.5)
nRBC: 0.4 % — ABNORMAL HIGH (ref 0.0–0.2)
nRBC: 0.6 % — ABNORMAL HIGH (ref 0.0–0.2)

## 2021-04-01 LAB — BASIC METABOLIC PANEL
Anion gap: 5 (ref 5–15)
BUN: 7 mg/dL (ref 6–20)
CO2: 28 mmol/L (ref 22–32)
Calcium: 7.7 mg/dL — ABNORMAL LOW (ref 8.9–10.3)
Chloride: 104 mmol/L (ref 98–111)
Creatinine, Ser: 0.87 mg/dL (ref 0.61–1.24)
GFR, Estimated: >60 mL/min (ref >60)
Glucose, Bld: 148 mg/dL — ABNORMAL HIGH (ref 70–99)
Potassium: 3.1 mmol/L — ABNORMAL LOW (ref 3.5–5.1)
Sodium: 137 mmol/L (ref 135–145)

## 2021-04-01 LAB — MAGNESIUM: Magnesium: 1.7 mg/dL (ref 1.7–2.4)

## 2021-04-01 MED ORDER — DOCUSATE SODIUM 100 MG PO CAPS
100.0000 mg | ORAL_CAPSULE | Freq: Two times a day (BID) | ORAL | Status: DC
  Administered 2021-04-01 – 2021-07-30 (×222): 100 mg via ORAL
  Filled 2021-04-01 (×235): qty 1

## 2021-04-01 MED ORDER — OXYCODONE HCL 5 MG PO TABS
5.0000 mg | ORAL_TABLET | ORAL | Status: DC | PRN
  Administered 2021-04-01 – 2021-04-06 (×16): 10 mg via ORAL
  Administered 2021-04-06: 5 mg via ORAL
  Administered 2021-04-07 – 2021-04-11 (×13): 10 mg via ORAL
  Administered 2021-04-12: 5 mg via ORAL
  Administered 2021-04-13: 10 mg via ORAL
  Administered 2021-04-14: 5 mg via ORAL
  Administered 2021-04-15 – 2021-04-26 (×18): 10 mg via ORAL
  Administered 2021-04-28: 5 mg via ORAL
  Administered 2021-04-29 – 2021-05-03 (×9): 10 mg via ORAL
  Administered 2021-05-04: 5 mg via ORAL
  Administered 2021-05-04 – 2021-05-13 (×8): 10 mg via ORAL
  Administered 2021-05-14: 5 mg via ORAL
  Administered 2021-05-14 – 2021-05-22 (×11): 10 mg via ORAL
  Administered 2021-05-22: 5 mg via ORAL
  Administered 2021-05-23 – 2021-05-27 (×7): 10 mg via ORAL
  Administered 2021-05-27: 5 mg via ORAL
  Administered 2021-05-27 (×2): 10 mg via ORAL
  Administered 2021-06-02: 5 mg via ORAL
  Filled 2021-04-01 (×10): qty 2
  Filled 2021-04-01: qty 1
  Filled 2021-04-01 (×5): qty 2
  Filled 2021-04-01: qty 1
  Filled 2021-04-01 (×2): qty 2
  Filled 2021-04-01 (×2): qty 1
  Filled 2021-04-01 (×3): qty 2
  Filled 2021-04-01: qty 1
  Filled 2021-04-01 (×7): qty 2
  Filled 2021-04-01: qty 1
  Filled 2021-04-01: qty 2
  Filled 2021-04-01: qty 1
  Filled 2021-04-01 (×59): qty 2
  Filled 2021-04-01: qty 1
  Filled 2021-04-01: qty 2

## 2021-04-01 MED ORDER — METHOCARBAMOL 500 MG PO TABS
500.0000 mg | ORAL_TABLET | Freq: Four times a day (QID) | ORAL | Status: DC | PRN
  Administered 2021-04-01 – 2021-05-04 (×17): 500 mg via ORAL
  Filled 2021-04-01 (×20): qty 1

## 2021-04-01 MED ORDER — POTASSIUM CHLORIDE CRYS ER 20 MEQ PO TBCR
40.0000 meq | EXTENDED_RELEASE_TABLET | Freq: Two times a day (BID) | ORAL | Status: AC
  Administered 2021-04-01 (×2): 40 meq via ORAL
  Filled 2021-04-01 (×2): qty 2

## 2021-04-01 MED ORDER — POLYETHYLENE GLYCOL 3350 17 G PO PACK
17.0000 g | PACK | Freq: Every day | ORAL | Status: DC
  Administered 2021-04-01 – 2021-07-22 (×68): 17 g via ORAL
  Filled 2021-04-01 (×95): qty 1

## 2021-04-01 MED ORDER — HYDROMORPHONE HCL 1 MG/ML IJ SOLN
0.5000 mg | INTRAMUSCULAR | Status: DC | PRN
  Administered 2021-04-01 – 2021-04-12 (×9): 0.5 mg via INTRAVENOUS
  Filled 2021-04-01 (×10): qty 0.5
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
## 2021-04-01 NOTE — Plan of Care (Signed)
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
## 2021-04-02 LAB — CALCITRIOL (1,25 DI-OH VIT D): Vit D, 1,25-Dihydroxy: 52.1 pg/mL (ref 24.8–81.5)

## 2021-04-02 LAB — CBC
HCT: 25.3 % — ABNORMAL LOW (ref 39.0–52.0)
Hemoglobin: 8.5 g/dL — ABNORMAL LOW (ref 13.0–17.0)
MCH: 29.2 pg (ref 26.0–34.0)
MCHC: 33.6 g/dL (ref 30.0–36.0)
MCV: 86.9 fL (ref 80.0–100.0)
Platelets: 203 10*3/uL (ref 150–400)
RBC: 2.91 MIL/uL — ABNORMAL LOW (ref 4.22–5.81)
RDW: 16.3 % — ABNORMAL HIGH (ref 11.5–15.5)
WBC: 5.6 10*3/uL (ref 4.0–10.5)
nRBC: 0.5 % — ABNORMAL HIGH (ref 0.0–0.2)

## 2021-04-02 LAB — BASIC METABOLIC PANEL
Anion gap: 7 (ref 5–15)
BUN: 7 mg/dL (ref 6–20)
CO2: 27 mmol/L (ref 22–32)
Calcium: 8.4 mg/dL — ABNORMAL LOW (ref 8.9–10.3)
Chloride: 104 mmol/L (ref 98–111)
Creatinine, Ser: 0.8 mg/dL (ref 0.61–1.24)
GFR, Estimated: >60 mL/min (ref >60)
Glucose, Bld: 144 mg/dL — ABNORMAL HIGH (ref 70–99)
Potassium: 3.5 mmol/L (ref 3.5–5.1)
Sodium: 138 mmol/L (ref 135–145)

## 2021-04-02 MED ORDER — METOPROLOL TARTRATE 5 MG/5ML IV SOLN: 5.0000 mg | Freq: Four times a day (QID) | INTRAVENOUS | Status: DC | PRN

## 2021-04-02 MED ORDER — LORAZEPAM 2 MG/ML IJ SOLN: 0.0000 mg | INTRAMUSCULAR | Status: AC | PRN

## 2021-04-02 MED ORDER — SPIRITUS FRUMENTI
1.0000 | Freq: Two times a day (BID) | ORAL | Status: DC
  Administered 2021-04-02 – 2021-04-13 (×16): 1 via ORAL
  Filled 2021-04-02 (×25): qty 1

## 2021-04-02 MED ORDER — CHLORDIAZEPOXIDE HCL 5 MG PO CAPS
5.0000 mg | ORAL_CAPSULE | Freq: Three times a day (TID) | ORAL | Status: DC
  Administered 2021-04-02 – 2021-04-03 (×3): 5 mg via ORAL
  Filled 2021-04-02 (×3): qty 1

## 2021-04-02 MED ORDER — LORAZEPAM 2 MG/ML IJ SOLN
1.0000 mg | INTRAMUSCULAR | Status: AC | PRN
  Administered 2021-04-02 (×2): 2 mg via INTRAVENOUS
  Filled 2021-04-02 (×2): qty 1
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
## 2021-04-02 NOTE — Progress Notes (Signed)
Patient confused and repeatedly removing splint from right forearm. RN re-wrapped multiple times, and even with mitts on, patient manages to unwrap splint. Reapplied and will continue to monitor.
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
## 2021-04-03 LAB — BASIC METABOLIC PANEL
Anion gap: 7 (ref 5–15)
BUN: <5 mg/dL — ABNORMAL LOW (ref 6–20)
CO2: 25 mmol/L (ref 22–32)
Calcium: 8.6 mg/dL — ABNORMAL LOW (ref 8.9–10.3)
Chloride: 103 mmol/L (ref 98–111)
Creatinine, Ser: 0.8 mg/dL (ref 0.61–1.24)
GFR, Estimated: >60 mL/min (ref >60)
Glucose, Bld: 132 mg/dL — ABNORMAL HIGH (ref 70–99)
Potassium: 3.4 mmol/L — ABNORMAL LOW (ref 3.5–5.1)
Sodium: 135 mmol/L (ref 135–145)

## 2021-04-03 MED ORDER — POTASSIUM CHLORIDE CRYS ER 20 MEQ PO TBCR
20.0000 meq | EXTENDED_RELEASE_TABLET | Freq: Once | ORAL | Status: AC
  Administered 2021-04-03: 20 meq via ORAL
  Filled 2021-04-03: qty 1

## 2021-04-03 MED ORDER — LEVETIRACETAM 500 MG PO TABS
1000.0000 mg | ORAL_TABLET | Freq: Two times a day (BID) | ORAL | Status: DC
  Administered 2021-04-03 – 2021-04-12 (×17): 1000 mg via ORAL
  Filled 2021-04-03 (×18): qty 2

## 2021-04-03 MED ORDER — CHLORDIAZEPOXIDE HCL 10 MG PO CAPS
10.0000 mg | ORAL_CAPSULE | Freq: Three times a day (TID) | ORAL | Status: DC
  Administered 2021-04-03 – 2021-04-12 (×24): 10 mg via ORAL
  Filled 2021-04-03 (×24): qty 2

## 2021-04-03 NOTE — Progress Notes (Signed)
Pt restless and agitated throughout the night. Removing dressings, clothing, picking at staples; prn medications altogether not affective. Continues to be confused, only oriented to self. Floor mats and telesitter in place for safety, will continue to monitor closely.
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
## 2021-04-03 NOTE — Progress Notes (Signed)
This nurse removed left subclavian CVC, per order, catheter tip intact, covered with Vaseline gauze and 4x4. Educated to leave the dressing in place  Morton Stall, RN

## 2021-04-04 LAB — BASIC METABOLIC PANEL
Anion gap: 7 (ref 5–15)
BUN: 9 mg/dL (ref 6–20)
CO2: 26 mmol/L (ref 22–32)
Calcium: 8.9 mg/dL (ref 8.9–10.3)
Chloride: 103 mmol/L (ref 98–111)
Creatinine, Ser: 0.77 mg/dL (ref 0.61–1.24)
GFR, Estimated: >60 mL/min (ref >60)
Glucose, Bld: 114 mg/dL — ABNORMAL HIGH (ref 70–99)
Potassium: 3.9 mmol/L (ref 3.5–5.1)
Sodium: 136 mmol/L (ref 135–145)

## 2021-04-04 LAB — MAGNESIUM: Magnesium: 1.8 mg/dL (ref 1.7–2.4)
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
## 2021-04-05 NOTE — Progress Notes (Signed)
Orthopedic Tech Progress Note Patient Details:  Jerome [name] 1961-05-23 [id number] Called in order to [facility] Patient ID: Doy Taaffe, male   DOB: 27-Jan-1961, 60 y.o.   MRN: [id number]  Lovett Calender 04/05/2021, 5:35 PM

## 2021-04-06 LAB — BASIC METABOLIC PANEL
Anion gap: 8 (ref 5–15)
BUN: 11 mg/dL (ref 6–20)
CO2: 24 mmol/L (ref 22–32)
Calcium: 9 mg/dL (ref 8.9–10.3)
Chloride: 100 mmol/L (ref 98–111)
Creatinine, Ser: 0.83 mg/dL (ref 0.61–1.24)
GFR, Estimated: >60 mL/min (ref >60)
Glucose, Bld: 143 mg/dL — ABNORMAL HIGH (ref 70–99)
Potassium: 3.9 mmol/L (ref 3.5–5.1)
Sodium: 132 mmol/L — ABNORMAL LOW (ref 135–145)

## 2021-04-06 NOTE — Progress Notes (Signed)
RN attempted to removed staples per order, pt thrashing head about and uncooperative; will retry when pt is cooperating.
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
## 2021-04-06 NOTE — Progress Notes (Signed)
Multiple attempts to keep cardiac monitor and oxygen monitor on patient during this shift. Patient has become increasingly agitated and pulling off medical monitoring equipment. Patient unable to be verbally redirected. Music playing on computer as patient requested there to be "some noise"  patient continues to request to "get up out of here" explained to patient multiple times why it is important for him to continue treatment. Patient is not accepting of reasons why he cannot go at this time. Patient has become verbally aggressive towards staff. Drink has been provided and comfort measures provided at patient request. Patient remains hostile toward staff members.
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
## 2021-04-06 NOTE — Progress Notes (Signed)
Pt has stage 1 pressure ulcer on left forearm under left wrist restraint.  Wound cleaned and foam applied to wrist before restraint reapplied.

## 2021-04-06 NOTE — Progress Notes (Signed)
Attempted to remove staples. PT said no cut the thing off of the leg. This nurse told pt I wasn't there to talk about the leg just remove the staples from his head. Pt stated " I said cut it off or I'll kill you". This nurse did not remove staples at this time.
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
## 2021-04-07 MED ORDER — CEFAZOLIN SODIUM-DEXTROSE 2-4 GM/100ML-% IV SOLN
2.0000 g | Freq: Once | INTRAVENOUS | Status: AC
  Administered 2021-04-08: 2 g via INTRAVENOUS
  Filled 2021-04-07 (×2): qty 100

## 2021-04-07 MED ORDER — SORBITOL 70 % SOLN
60.0000 mL | Freq: Once | Status: AC
  Administered 2021-04-07: 60 mL via ORAL
  Filled 2021-04-07: qty 60
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
## 2021-04-07 NOTE — Progress Notes (Signed)
OT Cancellation Note  Patient Details Name: Gavin Telford MRN: [id number] DOB: 05-13-1961   Cancelled Treatment:    Reason Eval/Treat Not Completed: Fatigue/lethargy limiting ability to participate (Nursing states patient is sleeping and requesting allow him to rest.  Will return at a later time.) Alfonse Flavors, OTA  Elveta Rape Jeannett Senior 04/07/2021, 12:57 PM

## 2021-04-08 ENCOUNTER — Inpatient Hospital Stay (HOSPITAL_COMMUNITY): Payer: No Typology Code available for payment source

## 2021-04-08 ENCOUNTER — Encounter (HOSPITAL_COMMUNITY): Payer: Self-pay

## 2021-04-08 ENCOUNTER — Encounter (HOSPITAL_COMMUNITY): Admission: EM | Disposition: A | Discharge status: Home / Self Care | Payer: Self-pay | Source: Home / Self Care

## 2021-04-08 ENCOUNTER — Inpatient Hospital Stay (HOSPITAL_COMMUNITY): Payer: No Typology Code available for payment source | Admitting: Registered Nurse

## 2021-04-08 DIAGNOSIS — L8990 Pressure ulcer of unspecified site, unspecified stage: Secondary | ICD-10-CM | POA: Insufficient documentation

## 2021-04-08 HISTORY — PX: EXTERNAL FIXATION REMOVAL: SHX5040

## 2021-04-08 HISTORY — PX: ORIF TIBIA PLATEAU: SHX2132

## 2021-04-08 LAB — POCT I-STAT EG7
Acid-Base Excess: 4 mmol/L — ABNORMAL HIGH (ref 0.0–2.0)
Bicarbonate: 29.1 mmol/L — ABNORMAL HIGH (ref 20.0–28.0)
Calcium, Ion: 1.26 mmol/L (ref 1.15–1.40)
HCT: 31 % — ABNORMAL LOW (ref 39.0–52.0)
Hemoglobin: 10.5 g/dL — ABNORMAL LOW (ref 13.0–17.0)
O2 Saturation: 99 %
Potassium: 3.7 mmol/L (ref 3.5–5.1)
Sodium: 138 mmol/L (ref 135–145)
TCO2: 30 mmol/L (ref 22–32)
pCO2, Ven: 44.9 mmHg (ref 44.0–60.0)
pH, Ven: 7.42 (ref 7.250–7.430)
pO2, Ven: 146 mmHg — ABNORMAL HIGH (ref 32.0–45.0)

## 2021-04-08 LAB — COMPREHENSIVE METABOLIC PANEL
ALT: 42 U/L (ref 0–44)
AST: 46 U/L — ABNORMAL HIGH (ref 15–41)
Albumin: 2.9 g/dL — ABNORMAL LOW (ref 3.5–5.0)
Alkaline Phosphatase: 85 U/L (ref 38–126)
Anion gap: 10 (ref 5–15)
BUN: 10 mg/dL (ref 6–20)
CO2: 23 mmol/L (ref 22–32)
Calcium: 9.6 mg/dL (ref 8.9–10.3)
Chloride: 103 mmol/L (ref 98–111)
Creatinine, Ser: 0.78 mg/dL (ref 0.61–1.24)
GFR, Estimated: >60 mL/min (ref >60)
Glucose, Bld: 116 mg/dL — ABNORMAL HIGH (ref 70–99)
Potassium: 3.9 mmol/L (ref 3.5–5.1)
Sodium: 136 mmol/L (ref 135–145)
Total Bilirubin: 1.3 mg/dL — ABNORMAL HIGH (ref 0.3–1.2)
Total Protein: 8.3 g/dL — ABNORMAL HIGH (ref 6.5–8.1)

## 2021-04-08 LAB — CBC
HCT: 31.3 % — ABNORMAL LOW (ref 39.0–52.0)
Hemoglobin: 10.1 g/dL — ABNORMAL LOW (ref 13.0–17.0)
MCH: 29.4 pg (ref 26.0–34.0)
MCHC: 32.3 g/dL (ref 30.0–36.0)
MCV: 91 fL (ref 80.0–100.0)
Platelets: 781 10*3/uL — ABNORMAL HIGH (ref 150–400)
RBC: 3.44 MIL/uL — ABNORMAL LOW (ref 4.22–5.81)
RDW: 17.9 % — ABNORMAL HIGH (ref 11.5–15.5)
WBC: 12.3 10*3/uL — ABNORMAL HIGH (ref 4.0–10.5)
nRBC: 0.2 % (ref 0.0–0.2)

## 2021-04-08 LAB — TYPE AND SCREEN
ABO/RH(D): O POS
Antibody Screen: NEGATIVE

## 2021-04-08 SURGERY — OPEN REDUCTION INTERNAL FIXATION (ORIF) TIBIAL PLATEAU
Anesthesia: General | Laterality: Right

## 2021-04-08 MED ORDER — DEXAMETHASONE SODIUM PHOSPHATE 10 MG/ML IJ SOLN
INTRAMUSCULAR | Status: DC | PRN
  Administered 2021-04-08: 5 mg via INTRAVENOUS

## 2021-04-08 MED ORDER — LACTATED RINGERS IV SOLN: INTRAVENOUS | Status: DC

## 2021-04-08 MED ORDER — SODIUM CHLORIDE 0.9 % IV SOLN
INTRAVENOUS | Status: DC | PRN
  Administered 2021-04-08: 40 ug/min via INTRAVENOUS

## 2021-04-08 MED ORDER — CHLORHEXIDINE GLUCONATE 0.12 % MT SOLN
OROMUCOSAL | Status: AC
  Administered 2021-04-08: 15 mL via OROMUCOSAL
  Filled 2021-04-08: qty 15

## 2021-04-08 MED ORDER — FENTANYL CITRATE (PF) 100 MCG/2ML IJ SOLN
INTRAMUSCULAR | Status: DC | PRN
  Administered 2021-04-08: 100 ug via INTRAVENOUS
  Administered 2021-04-08: 25 ug via INTRAVENOUS
  Administered 2021-04-08: 150 ug via INTRAVENOUS
  Administered 2021-04-08: 100 ug via INTRAVENOUS
  Administered 2021-04-08: 50 ug via INTRAVENOUS

## 2021-04-08 MED ORDER — ONDANSETRON HCL 4 MG/2ML IJ SOLN
INTRAMUSCULAR | Status: DC | PRN
  Administered 2021-04-08: 4 mg via INTRAVENOUS

## 2021-04-08 MED ORDER — FENTANYL CITRATE (PF) 100 MCG/2ML IJ SOLN
INTRAMUSCULAR | Status: AC
  Filled 2021-04-08: qty 2

## 2021-04-08 MED ORDER — 0.9 % SODIUM CHLORIDE (POUR BTL) OPTIME
TOPICAL | Status: DC | PRN
  Administered 2021-04-08: 1000 mL

## 2021-04-08 MED ORDER — PROPOFOL 10 MG/ML IV BOLUS
INTRAVENOUS | Status: DC | PRN
  Administered 2021-04-08: 200 mg via INTRAVENOUS

## 2021-04-08 MED ORDER — SUGAMMADEX SODIUM 200 MG/2ML IV SOLN
INTRAVENOUS | Status: DC | PRN
  Administered 2021-04-08: 200 mg via INTRAVENOUS

## 2021-04-08 MED ORDER — ACETAMINOPHEN 10 MG/ML IV SOLN: 1000.0000 mg | Freq: Once | INTRAVENOUS | Status: DC | PRN

## 2021-04-08 MED ORDER — FENTANYL CITRATE (PF) 100 MCG/2ML IJ SOLN
25.0000 ug | INTRAMUSCULAR | Status: DC | PRN
  Administered 2021-04-08 (×3): 50 ug via INTRAVENOUS

## 2021-04-08 MED ORDER — CHLORHEXIDINE GLUCONATE 0.12 % MT SOLN: 15.0000 mL | Freq: Once | OROMUCOSAL | Status: AC

## 2021-04-08 MED ORDER — MIDAZOLAM HCL 2 MG/2ML IJ SOLN
INTRAMUSCULAR | Status: AC
  Filled 2021-04-08: qty 2

## 2021-04-08 MED ORDER — CEFAZOLIN SODIUM-DEXTROSE 2-4 GM/100ML-% IV SOLN
2.0000 g | Freq: Three times a day (TID) | INTRAVENOUS | Status: AC
  Administered 2021-04-08 – 2021-04-09 (×3): 2 g via INTRAVENOUS
  Filled 2021-04-08 (×3): qty 100

## 2021-04-08 MED ORDER — BUPIVACAINE-EPINEPHRINE (PF) 0.25% -1:200000 IJ SOLN
INTRAMUSCULAR | Status: AC
  Filled 2021-04-08: qty 30

## 2021-04-08 MED ORDER — FENTANYL CITRATE (PF) 250 MCG/5ML IJ SOLN
INTRAMUSCULAR | Status: AC
  Filled 2021-04-08: qty 5

## 2021-04-08 MED ORDER — ROCURONIUM BROMIDE 100 MG/10ML IV SOLN
INTRAVENOUS | Status: DC | PRN
  Administered 2021-04-08: 10 mg via INTRAVENOUS
  Administered 2021-04-08: 20 mg via INTRAVENOUS
  Administered 2021-04-08: 70 mg via INTRAVENOUS
  Administered 2021-04-08: 20 mg via INTRAVENOUS

## 2021-04-08 MED ORDER — OXYCODONE HCL 5 MG PO TABS
ORAL_TABLET | ORAL | Status: AC
  Filled 2021-04-08: qty 1

## 2021-04-08 MED ORDER — ONDANSETRON HCL 4 MG/2ML IJ SOLN
INTRAMUSCULAR | Status: AC
  Filled 2021-04-08: qty 2

## 2021-04-08 MED ORDER — PHENYLEPHRINE HCL (PRESSORS) 10 MG/ML IV SOLN
INTRAVENOUS | Status: DC | PRN
  Administered 2021-04-08 (×5): 80 ug via INTRAVENOUS

## 2021-04-08 MED ORDER — LIDOCAINE 2% (20 MG/ML) 5 ML SYRINGE
INTRAMUSCULAR | Status: AC
  Filled 2021-04-08: qty 5

## 2021-04-08 MED ORDER — ORAL CARE MOUTH RINSE: 15.0000 mL | Freq: Once | OROMUCOSAL | Status: AC

## 2021-04-08 MED ORDER — ROCURONIUM BROMIDE 10 MG/ML (PF) SYRINGE
PREFILLED_SYRINGE | INTRAVENOUS | Status: AC
  Filled 2021-04-08: qty 10

## 2021-04-08 MED ORDER — MIDAZOLAM HCL 5 MG/5ML IJ SOLN
INTRAMUSCULAR | Status: DC | PRN
  Administered 2021-04-08: 2 mg via INTRAVENOUS

## 2021-04-08 MED ORDER — DEXMEDETOMIDINE (PRECEDEX) IN NS 20 MCG/5ML (4 MCG/ML) IV SYRINGE
PREFILLED_SYRINGE | INTRAVENOUS | Status: AC
  Filled 2021-04-08: qty 5

## 2021-04-08 MED ORDER — SODIUM CHLORIDE 0.9 % IV SOLN: INTRAVENOUS | Status: DC | PRN

## 2021-04-08 MED ORDER — PROPOFOL 10 MG/ML IV BOLUS
INTRAVENOUS | Status: AC
  Filled 2021-04-08: qty 20

## 2021-04-08 MED ORDER — LIDOCAINE HCL (CARDIAC) PF 100 MG/5ML IV SOSY
PREFILLED_SYRINGE | INTRAVENOUS | Status: DC | PRN
  Administered 2021-04-08: 20 mg via INTRAVENOUS

## 2021-04-08 MED ORDER — DEXMEDETOMIDINE (PRECEDEX) IN NS 20 MCG/5ML (4 MCG/ML) IV SYRINGE
PREFILLED_SYRINGE | INTRAVENOUS | Status: DC | PRN
  Administered 2021-04-08: 8 ug via INTRAVENOUS
  Administered 2021-04-08: 12 ug via INTRAVENOUS

## 2021-04-08 SURGICAL SUPPLY — 88 items
BAG COUNTER SPONGE SURGICOUNT (BAG) ×2 IMPLANT
BANDAGE ESMARK 6X9 LF (GAUZE/BANDAGES/DRESSINGS) ×1 IMPLANT
BIT DRILL 3.3 LONG (BIT) ×2 IMPLANT
BIT DRILL QC 3.3X195 (BIT) ×2 IMPLANT
BLADE CLIPPER SURG (BLADE) IMPLANT
BLADE SURG 10 STRL SS (BLADE) ×2 IMPLANT
BLADE SURG 15 STRL LF DISP TIS (BLADE) ×1 IMPLANT
BLADE SURG 15 STRL SS (BLADE) ×1
BNDG COHESIVE 4X5 TAN STRL (GAUZE/BANDAGES/DRESSINGS) ×2 IMPLANT
BNDG ELASTIC 4X5.8 VLCR STR LF (GAUZE/BANDAGES/DRESSINGS) ×2 IMPLANT
BNDG ELASTIC 6X10 VLCR STRL LF (GAUZE/BANDAGES/DRESSINGS) ×2 IMPLANT
BNDG ELASTIC 6X5.8 VLCR STR LF (GAUZE/BANDAGES/DRESSINGS) ×2 IMPLANT
BNDG ESMARK 6X9 LF (GAUZE/BANDAGES/DRESSINGS) ×2
BNDG GAUZE ELAST 4 BULKY (GAUZE/BANDAGES/DRESSINGS) ×2 IMPLANT
BRUSH SCRUB EZ PLAIN DRY (MISCELLANEOUS) ×4 IMPLANT
CANISTER SUCT 3000ML PPV (MISCELLANEOUS) ×2 IMPLANT
CAP LOCK NCB (Cap) ×8 IMPLANT
COVER SURGICAL LIGHT HANDLE (MISCELLANEOUS) ×4 IMPLANT
CUFF TOURN SGL QUICK 34 (TOURNIQUET CUFF) ×1
CUFF TRNQT CYL 34X4.125X (TOURNIQUET CUFF) ×1 IMPLANT
DRAPE C-ARM 42X72 X-RAY (DRAPES) ×2 IMPLANT
DRAPE C-ARMOR (DRAPES) ×2 IMPLANT
DRAPE HALF SHEET 40X57 (DRAPES) IMPLANT
DRAPE INCISE IOBAN 66X45 STRL (DRAPES) ×2 IMPLANT
DRAPE U-SHAPE 47X51 STRL (DRAPES) ×2 IMPLANT
DRSG ADAPTIC 3X8 NADH LF (GAUZE/BANDAGES/DRESSINGS) ×2 IMPLANT
DRSG PAD ABDOMINAL 8X10 ST (GAUZE/BANDAGES/DRESSINGS) ×4 IMPLANT
ELECT REM PT RETURN 9FT ADLT (ELECTROSURGICAL) ×2
ELECTRODE REM PT RTRN 9FT ADLT (ELECTROSURGICAL) ×1 IMPLANT
GAUZE SPONGE 4X4 12PLY STRL (GAUZE/BANDAGES/DRESSINGS) ×2 IMPLANT
GAUZE SPONGE 4X4 12PLY STRL LF (GAUZE/BANDAGES/DRESSINGS) ×2 IMPLANT
GLOVE SRG 8 PF TXTR STRL LF DI (GLOVE) ×1 IMPLANT
GLOVE SURG ENC MOIS LTX SZ7.5 (GLOVE) ×2 IMPLANT
GLOVE SURG ENC MOIS LTX SZ8 (GLOVE) ×2 IMPLANT
GLOVE SURG UNDER POLY LF SZ7.5 (GLOVE) ×2 IMPLANT
GLOVE SURG UNDER POLY LF SZ8 (GLOVE) ×1
GOWN STRL REUS W/ TWL LRG LVL3 (GOWN DISPOSABLE) ×2 IMPLANT
GOWN STRL REUS W/ TWL XL LVL3 (GOWN DISPOSABLE) ×1 IMPLANT
GOWN STRL REUS W/TWL LRG LVL3 (GOWN DISPOSABLE) ×2
GOWN STRL REUS W/TWL XL LVL3 (GOWN DISPOSABLE) ×1
GUIDE PIN DRILL TIP 2.8X450HIP (PIN) ×2
IMMOBILIZER KNEE 22 UNIV (SOFTGOODS) ×2 IMPLANT
K-WIRE 2.0 (WIRE) ×3
K-WIRE FXSTD 280X2XNS SS (WIRE) ×3
KIT BASIN OR (CUSTOM PROCEDURE TRAY) ×2 IMPLANT
KIT TURNOVER KIT B (KITS) ×2 IMPLANT
KWIRE FXSTD 280X2XNS SS (WIRE) ×3 IMPLANT
MANIFOLD NEPTUNE II (INSTRUMENTS) ×2 IMPLANT
NDL SUT 6 .5 CRC .975X.05 MAYO (NEEDLE) IMPLANT
NEEDLE MAYO TAPER (NEEDLE)
NS IRRIG 1000ML POUR BTL (IV SOLUTION) ×2 IMPLANT
PACK ORTHO EXTREMITY (CUSTOM PROCEDURE TRAY) ×2 IMPLANT
PAD ABD 8X10 STRL (GAUZE/BANDAGES/DRESSINGS) ×2 IMPLANT
PAD ARMBOARD 7.5X6 YLW CONV (MISCELLANEOUS) ×4 IMPLANT
PAD CAST 4YDX4 CTTN HI CHSV (CAST SUPPLIES) ×1 IMPLANT
PADDING CAST ABS 6INX4YD NS (CAST SUPPLIES) ×1
PADDING CAST ABS COTTON 6X4 NS (CAST SUPPLIES) ×1 IMPLANT
PADDING CAST COTTON 4X4 STRL (CAST SUPPLIES) ×1
PADDING CAST COTTON 6X4 STRL (CAST SUPPLIES) ×6 IMPLANT
PIN GUIDE DRILL TIP 2.8X450HIP (PIN) ×1 IMPLANT
PLATE LOCK LAT 292 RT 13H (Plate) ×2 IMPLANT
SCREW CANN 80X40X6.5 NS (Screw) ×1 IMPLANT
SCREW CANNULATED 6.5X80MM (Screw) ×1 IMPLANT
SCREW NCB 4.0 28MM (Screw) ×2 IMPLANT
SCREW NCB 4.0MX30M (Screw) ×6 IMPLANT
SCREW NCB PA ST 4X3.4X85 (Screw) ×2 IMPLANT
SCREW NCB PA ST 4X3.4X90 (Screw) ×4 IMPLANT
SCREW PROX ST NCB 4X80 (Screw) ×2 IMPLANT
SPONGE T-LAP 18X18 ~~LOC~~+RFID (SPONGE) ×2 IMPLANT
STAPLER VISISTAT 35W (STAPLE) ×2 IMPLANT
STOCKINETTE IMPERVIOUS LG (DRAPES) ×2 IMPLANT
SUCTION FRAZIER HANDLE 10FR (MISCELLANEOUS) ×1
SUCTION TUBE FRAZIER 10FR DISP (MISCELLANEOUS) ×1 IMPLANT
SUT ETHILON 2 0 FS 18 (SUTURE) IMPLANT
SUT PROLENE 0 CT 2 (SUTURE) ×4 IMPLANT
SUT VIC AB 0 CT1 27 (SUTURE) ×1
SUT VIC AB 0 CT1 27XBRD ANBCTR (SUTURE) ×1 IMPLANT
SUT VIC AB 1 CT1 27 (SUTURE) ×1
SUT VIC AB 1 CT1 27XBRD ANBCTR (SUTURE) ×1 IMPLANT
SUT VIC AB 2-0 CT1 27 (SUTURE) ×2
SUT VIC AB 2-0 CT1 TAPERPNT 27 (SUTURE) ×2 IMPLANT
TOWEL GREEN STERILE (TOWEL DISPOSABLE) ×4 IMPLANT
TOWEL GREEN STERILE FF (TOWEL DISPOSABLE) ×4 IMPLANT
TRAY FOLEY MTR SLVR 16FR STAT (SET/KITS/TRAYS/PACK) IMPLANT
TUBE CONNECTING 12X1/4 (SUCTIONS) ×2 IMPLANT
UNDERPAD 30X36 HEAVY ABSORB (UNDERPADS AND DIAPERS) ×2 IMPLANT
WATER STERILE IRR 1000ML POUR (IV SOLUTION) ×4 IMPLANT
YANKAUER SUCT BULB TIP NO VENT (SUCTIONS) ×2 IMPLANT

## 2021-04-08 NOTE — Transfer of Care (Signed)
Immediate Anesthesia Transfer of Care Note  Patient: Jerome [name]  Procedure(s) Performed: OPEN REDUCTION INTERNAL FIXATION (ORIF) TIBIAL PLATEAU (Right) REMOVAL EXTERNAL FIXATION LEG (Right)  Patient Location: PACU  Anesthesia Type:General  Level of Consciousness: awake and drowsy  Airway & Oxygen Therapy: Patient Spontanous Breathing and Patient connected to face mask oxygen  Post-op Assessment: Report given to RN and Post -op Vital signs reviewed and stable  Post vital signs: Reviewed and stable  Last Vitals:  Vitals Value Taken Time  BP 122/77 04/08/21 1540  Temp 36.3 C 04/08/21 1540  Pulse 77 04/08/21 1542  Resp 14 04/08/21 1542  SpO2 100 % 04/08/21 1542  Vitals shown include unvalidated device data.  Last Pain:  Vitals:   04/08/21 1114  TempSrc:   PainSc: 10-Worst pain ever      Patients Stated Pain Goal: 3 (04/08/21 1114)  Complications: No notable events documented.

## 2021-04-08 NOTE — TOC Progression Note (Signed)
Transition of Care [facility]) - Progression Note    Patient Details  Name: Jerome [name] MRN: [id number] Date of Birth: [birth year]  [facility]) CM/SW Contact  Jerome Mac, RN Phone Number: 04/08/2021, 5:08 PM  Clinical Narrative:    Discussed patient in multidisciplinary trauma rounds: Still unable to reach any family for patient.  Recommend psych MD evaluation for capacity, as patient likely to need guardianship.  PA to follow-up.   Barriers to Discharge: Continued Medical Work up  Expected Discharge Plan and Services     Discharge Planning Services: CM Consult   Living arrangements for the past 2 months: Homeless                                       Social Determinants of Health (SDOH) Interventions    Readmission Risk Interventions No flowsheet data found.  Jerome Baton, RN, BSN  Trauma/Neuro ICU Case Manager [phone]

## 2021-04-08 NOTE — Progress Notes (Signed)
OT Cancellation Note  Patient Details Name: Jerome [name] MRN: [id number] DOB: 1960-08-02   Cancelled Treatment:    Reason Eval/Treat Not Completed: Patient at procedure or test/ unavailable  Leeroy Lovings,HILLARY 04/08/2021, 11:15 AM Luisa Dago, OT/L   Acute OT Clinical Specialist Acute Rehabilitation Services Pager [phone] Office [phone]

## 2021-04-08 NOTE — Progress Notes (Signed)
Patient remains incoherent from alcohol withdrawal. At this point we need to proceed with emergent removal of his right external fixator and plating of his tibial plateau fracture.   Risks of right tibia surgery include the possibility of infection, nerve injury, vessel injury, wound breakdown, arthritis, symptomatic hardware, DVT/ PE, loss of motion, malunion, nonunion, and need for further surgery among others.  I have conferred with Dr. Violeta Gelinas who acknowledges the emergent nature of this procedure, the risks involved, and concurs with the plan to proceed without patient being able to sign his own consent at this time.  Myrene Galas, MD [facility], [facility] [phone]

## 2021-04-08 NOTE — Progress Notes (Signed)
SLP Cancellation Note  Patient Details Name: Jerome [name] MRN: [id number] DOB: 14-Jan-1961   Cancelled treatment:        Pt currently in surgery. Will plan to attempt tomorrow.    Royce Macadamia 04/08/2021, 11:11 AM  Breck Coons Lonell Face.Ed Nurse, children's [phone] Office [phone]

## 2021-04-08 NOTE — Progress Notes (Signed)
   Trauma/Critical Care Follow Up Note  Subjective:    Overnight Issues:  Calm today, laying in bed.  Cold and wanting a blanket. Hungry and wants cake and pudding. Objective:  Vital signs for last 24 hours: Temp:  [98 F (36.7 C)-98.9 F (37.2 C)] 98.5 F (36.9 C) (10/13 0825) Pulse Rate:  [84-98] 84 (10/13 0825) Resp:  [14-20] 20 (10/13 0825) BP: (113-140)/(63-85) 124/75 (10/13 0825) SpO2:  [95 %-98 %] 97 % (10/13 0825)   Intake/Output from previous day: 10/12 0701 - 10/13 0700 In: 1870 [P.O.:1760; I.V.:110] Out: 1500 [Urine:1500]  Intake/Output this shift: No intake/output data recorded.   Physical Exam:  Gen: comfortable, no distress Neuro: non-focal exam HEENT: PERRL Neck: supple CV: RRR Pulm: unlabored breathing Abd: soft, NT GU: clear yellow urine Extr: wwp, no edema, restraints in place on BUE, mitten in place on LUE. Restraint on LLE.  No KI on currently. Ex fix in place on RLE.  Wiggles toes and has normal sensation.  +2 pedal pulse on left side.  RUE incision is c/d/I with sutures present   No results found for this or any previous visit (from the past 24 hour(s)).   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 No mass effect. - Dr. Dutch Quint following.  No repeat imaging needed. Keppra x7d; librium increased 10/9 AM Scalp lac - s/p repair with staples 10/1, staples DC on 10/12 R proximal humerus FX - ORIF 10/4 by Dr. Carola Frost, NWB RUE R scapula FX - per Ortho, sling, NWB RUE Open R tib/fib fx: s/p ex fix by Dr. Blanchie Dessert 10/2, s/p adjustment ex fix and insertion antibiotic spacer by Dr. Carola Frost 10/4, ORIF 10/13 by Dr. Carola Frost. NWB RLE L ACL and lateral meniscus injury - MRI 10/3, per ortho plan for nonop, WBAT in hinge brace, will need repeat MRI due to motion artifact on initial imaging. ID - completed ceftriaxone per Trauma Ortho Acute blood loss anemia: Hgb 8.5 (10/7), stable. S/p 2u PRBCs 10/4 in OR. Labs  pending Alcohol dependence - Etoh 299 on admission. CIWA. SW consult. cont beer BID and librium TID Tobacco abuse DVT prophylaxis: lovenox HTN- monitor,stable currently FEN - KVO IVF, reg diet, beer BID, sorbitol, miralax, colace, NPO currently for OR today Dispo: med surg, TBI team therapies - recommending SNF. no family to aid in dispo planning at this time   Letha Cape, PA-C Trauma & General Surgery Please use [email or web address] to contact on call provider  04/08/2021

## 2021-04-08 NOTE — Op Note (Addendum)
04/08/2021 4:12 PM  PATIENT:  Jerome [name]  60 y.o. male [id number]  PRE-OPERATIVE DIAGNOSIS:   1. RIGHT BICONDYLAR TIBIAL PLATEAU FRACTURE 2. RIGHT  TIBIAL SPINE/ EMINENCE FRACTURE 3. RETAINED EXTERNAL FIXATOR RIGHT  KNEE 4. RIGHT LATERAL FEMORAL EPICONDYLE FRACTURE  POST-OPERATIVE DIAGNOSIS:   1. RIGHT BICONDYLAR TIBIAL PLATEAU FRACTURE 2. RIGHT  TIBIAL SPINE/ EMINENCE FRACTURE 3. RETAINED EXTERNAL FIXATOR RIGHT  KNEE 4. RIGHT LATERAL FEMORAL EPICONDYLE FRACTURE  PROCEDURE:  Procedure(s): 1. OPEN REDUCTION INTERNAL FIXATION (ORIF) BICONDYLAR TIBIAL PLATEAU  2. OPEN TREATMENT OF TIBIAL SPINE/ EMINENCE FRACTURE 3. OPEN TREATMENT OF TIBIAL SHAFT FRACTURE 4. REMOVAL OF EXTERNAL FIXATOR 5. REPAIR OF RIGHT LATERAL EPICONDYLE FRACTURE  SURGEON:  Surgeon(s) and Role:    Myrene Galas, MD - Primary  PHYSICIAN ASSISTANT: 1. KEITH PAUL, PA-C; 2. PA Student  ANESTHESIA:   general  EBL:  100 mL   BLOOD ADMINISTERED:none  DRAINS: none   LOCAL MEDICATIONS USED:  NONE  SPECIMEN: None  DISPOSITION OF SPECIMEN:  N/A  COUNTS:  YES  TOURNIQUET:   Total Tourniquet Time Documented: Thigh (Right) - 73 minutes Total: Thigh (Right) - 73 minutes   DICTATION: Written below.  PLAN OF CARE: Admit to inpatient   PATIENT DISPOSITION:  PACU - hemodynamically stable.   Delay start of Pharmacological VTE agent (>24hrs) due to surgical blood loss or risk of bleeding: no     BRIEF SUMMARY AND INDICATION FOR PROCEDURE:  Patient is a 60 y.o.-year-old with an open tibial plateau fracture, treated provisionally with immediate external fixation, elevation, and active motion of the foot and toes to facilitate resolution of soft tissue swelling.  We did discuss with the patient and her husband the risks and benefits of surgical treatment including the potential for arthritis, nerve injury, vessel injury, loss of motion, DVT, PE, heart attack, stroke, symptomatic hardware, need for further  surgery, and multiple others.  The patient acknowledged these risks and wished to proceed.   BRIEF SUMMARY OF PROCEDURE:  After administration of preoperative antibiotics, the patient was taken to the operating room.  General anesthesia was induced and then the external fixator removed because of its ulcerate pin sites and overall contamination after two weeks in the ICU. The lower extremity prepped and draped in usual sterile fashion using a chlorhexidine wash and betadine scrub and paint. A timeout was performed.   The ulcerated pin sites, which each measured approximately 10 mm in diameter were then debrided with serial curettes at the skin, subcutaneous tissue, muscle fascia, and cortical bone levels, excising all nonviable, contaminated, and devitalized tissue along the tracts back to healthy pink tissue.  Again, the depth of debridement was from skin to bone.    We started with the right femur on a radiolucent triangle, identifying an appropriate starting point for a cannulated screw which would secure the epicondyle but not be sufficiently prominent that it would require removal. I placed a k wire perpendicular to the fracture line and parallel with the joint and then an 8.0 mm Biomet star drive head screw to the appropriate depth, using a partially threaded one to achieve compression. Final images showed maintenance of reduction and appropriate length and trajectory.  I then brought in towel bumps over top of the bone foam with the help of my assistant to gain more length and slightly adjust the alignment. A small slightly curvilinear incision was made extending laterally over Gerdy's tubercle. Dissection was carried down where the soft tissues were left intact to the lateral  plateau and rim, elevated only the insertion of the extensors. I was then able to insert the 13 hole Zimmer NCB tibial plateau plate and pin it just on the lateral condyle proximally at the joint line. I maneuvered the medial  and lateral condyles into position with the tibial spine positioned so that it would be secured between to the two condylar fragments. After placing three proximal screws at the subchondral joint line of the bicondylar plateau, I then turned attention to the tibial shaft component of the fracture.  With regard to the shaft, I first placed a distal screw to fix length and rotation then proximal shaft screw to swing the shaft into proper alignment from a valgus position. Once I was satisfied with shaft reduction an additional screw placed distally, then the lag removed. Final images showed excellent alignment and three bicortical screws distally. At this point, I placed the remaining screw at the joint line and one more below it, all of which we would later convert to locked fixation by placing locking caps. No locking caps were placed distally. Final images showed appropriate reduction, implant position and length. All wounds were irrigated thoroughly.   Standard layered closure performed, 0 Vicryl, 2-0 Vicryl, and 3-0 nylon for the skin.  Sterile gently compressive dressing was applied and in knee immobilizer.  The patient was taken to the PACU in stable condition.   Lastly, the knee did show some medial laxity with valgus force. [name], PA-C was present and assisting throughout with all portions of the procedure.   PROGNOSIS: The patient will not require formal bracing given the stable examination post fixation but he has not shown tolerance for bracing on his other side and coherence seems to be an issue. Unrestricted range of motion will begin immediately with nonweightbearing on both extremities, pharmacologic DVT prophylaxis, and mobilize with PT and OT. After discharge, we will plan to see patient back in about 2 weeks for removal of sutures. Discharge planning will be challenging.         [name], M.D.
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
## 2021-04-08 NOTE — Anesthesia Procedure Notes (Signed)
Procedure Name: Intubation Date/Time: 04/08/2021 1:12 PM Performed by: Sharee Pimple, CRNA Pre-anesthesia Checklist: Patient identified, Emergency Drugs available, Suction available, Patient being monitored and Timeout performed Patient Re-evaluated:Patient Re-evaluated prior to induction Preoxygenation: Pre-oxygenation with 100% oxygen Induction Type: IV induction Ventilation: Mask ventilation without difficulty Laryngoscope Size: Mac and 4 Grade View: Grade I Tube type: Oral Tube size: 7.5 mm Number of attempts: 1 Airway Equipment and Method: Stylet Placement Confirmation: ETT inserted through vocal cords under direct vision, positive ETCO2, CO2 detector and breath sounds checked- equal and bilateral Secured at: 21 cm Dental Injury: Teeth and Oropharynx as per pre-operative assessment

## 2021-04-09 DIAGNOSIS — F209 Schizophrenia, unspecified: Secondary | ICD-10-CM

## 2021-04-09 LAB — CBC
HCT: 28.8 % — ABNORMAL LOW (ref 39.0–52.0)
Hemoglobin: 9.3 g/dL — ABNORMAL LOW (ref 13.0–17.0)
MCH: 29.4 pg (ref 26.0–34.0)
MCHC: 32.3 g/dL (ref 30.0–36.0)
MCV: 91.1 fL (ref 80.0–100.0)
Platelets: 581 10*3/uL — ABNORMAL HIGH (ref 150–400)
RBC: 3.16 MIL/uL — ABNORMAL LOW (ref 4.22–5.81)
RDW: 17.9 % — ABNORMAL HIGH (ref 11.5–15.5)
WBC: 14.8 10*3/uL — ABNORMAL HIGH (ref 4.0–10.5)
nRBC: 0 % (ref 0.0–0.2)

## 2021-04-09 LAB — FOLATE: Folate: 42.2 ng/mL (ref >5.9)

## 2021-04-09 MED ORDER — OLANZAPINE 5 MG PO TABS
5.0000 mg | ORAL_TABLET | Freq: Every day | ORAL | Status: DC
  Administered 2021-04-09 – 2021-04-26 (×18): 5 mg via ORAL
  Filled 2021-04-09 (×20): qty 1

## 2021-04-09 MED ORDER — HALOPERIDOL LACTATE 5 MG/ML IJ SOLN
5.0000 mg | Freq: Four times a day (QID) | INTRAMUSCULAR | Status: DC | PRN
  Administered 2021-04-09 – 2021-04-16 (×7): 5 mg via INTRAVENOUS
  Filled 2021-04-09 (×9): qty 1

## 2021-04-09 NOTE — Plan of Care (Signed)
  Problem: Safety: Goal: Non-violent Restraint(s) Outcome: Progressing   Problem: Education: Goal: Knowledge of General Education information will improve Description: Including pain rating scale, medication(s)/side effects and non-pharmacologic comfort measures Outcome: Progressing   Problem: Health Behavior/Discharge Planning: Goal: Ability to manage health-related needs will improve Outcome: Progressing   Problem: Clinical Measurements: Goal: Ability to maintain clinical measurements within normal limits will improve Outcome: Progressing Goal: Will remain free from infection Outcome: Progressing Goal: Diagnostic test results will improve Outcome: Progressing Goal: Respiratory complications will improve Outcome: Progressing Goal: Cardiovascular complication will be avoided Outcome: Progressing   Problem: Activity: Goal: Risk for activity intolerance will decrease Outcome: Progressing   Problem: Nutrition: Goal: Adequate nutrition will be maintained Outcome: Progressing   Problem: Coping: Goal: Level of anxiety will decrease Outcome: Progressing   Problem: Elimination: Goal: Will not experience complications related to bowel motility Outcome: Progressing Goal: Will not experience complications related to urinary retention Outcome: Progressing   Problem: Pain Managment: Goal: General experience of comfort will improve Outcome: Progressing   Problem: Safety: Goal: Ability to remain free from injury will improve Outcome: Progressing   Problem: Skin Integrity: Goal: Risk for impaired skin integrity will decrease Outcome: Progressing   Problem: Education: Goal: Required Educational Video(s) Outcome: Progressing   Problem: Clinical Measurements: Goal: Ability to maintain clinical measurements within normal limits will improve Outcome: Progressing Goal: Postoperative complications will be avoided or minimized Outcome: Progressing   Problem: Skin  Integrity: Goal: Demonstration of wound healing without infection will improve Outcome: Progressing
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
## 2021-04-09 NOTE — Progress Notes (Signed)
Pt arrived to floor from pacu. Pt calm and following commands upon arrival. Pt now cursing, asking for cigarette, pulling tele off and trying to take bandage off leg that just had surgery. Pt changed placed condom cath and soft restraint reactivated for his safety. Cont to monitor

## 2021-04-09 NOTE — Anesthesia Postprocedure Evaluation (Signed)
Anesthesia Post Note  Patient: Isa Kohlenberg  Procedure(s) Performed: OPEN REDUCTION INTERNAL FIXATION (ORIF) TIBIAL PLATEAU (Right) REMOVAL EXTERNAL FIXATION LEG (Right)     Patient location during evaluation: PACU Anesthesia Type: General Level of consciousness: awake and alert Pain management: pain level controlled Vital Signs Assessment: post-procedure vital signs reviewed and stable Respiratory status: spontaneous breathing, nonlabored ventilation, respiratory function stable and patient connected to nasal cannula oxygen Cardiovascular status: blood pressure returned to baseline and stable Postop Assessment: no apparent nausea or vomiting Anesthetic complications: no   No notable events documented.  Last Vitals:  Vitals:   04/09/21 1602 04/09/21 2002  BP: 118/73 122/84  Pulse: 93 89  Resp: 16 17  Temp: 36.6 C 37.2 C  SpO2: 100% 98%    Last Pain:  Vitals:   04/09/21 2002  TempSrc: Oral  PainSc:                  Nelle Don Madison [place]

## 2021-04-09 NOTE — Progress Notes (Signed)
..  Trauma Response Nurse Note- Rounding on pt- in 4 pt soft restraints at preent- still able to pull off telelmetry box. States "I want to get the f*&^ up out of here- get me out of here!" Pt able to be minimally redirected = primary RN at bedside. Orders received from Trauma PA.   Anell Barr, RN Trauma Response Nurse [phone]
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
## 2021-04-10 DIAGNOSIS — R410 Disorientation, unspecified: Secondary | ICD-10-CM

## 2021-04-10 DIAGNOSIS — S065X0A Traumatic subdural hemorrhage without loss of consciousness, initial encounter: Secondary | ICD-10-CM

## 2021-04-10 LAB — CBC
HCT: 28.4 % — ABNORMAL LOW (ref 39.0–52.0)
Hemoglobin: 8.9 g/dL — ABNORMAL LOW (ref 13.0–17.0)
MCH: 29 pg (ref 26.0–34.0)
MCHC: 31.3 g/dL (ref 30.0–36.0)
MCV: 92.5 fL (ref 80.0–100.0)
Platelets: 409 10*3/uL — ABNORMAL HIGH (ref 150–400)
RBC: 3.07 MIL/uL — ABNORMAL LOW (ref 4.22–5.81)
RDW: 18.6 % — ABNORMAL HIGH (ref 11.5–15.5)
WBC: 13.5 10*3/uL — ABNORMAL HIGH (ref 4.0–10.5)
nRBC: 0.1 % (ref 0.0–0.2)

## 2021-04-10 LAB — HIV ANTIBODY (ROUTINE TESTING W REFLEX): HIV Screen 4th Generation wRfx: NONREACTIVE

## 2021-04-10 LAB — HEPATITIS PANEL, ACUTE
HCV Ab: REACTIVE — AB
Hep A IgM: NONREACTIVE
Hep B C IgM: NONREACTIVE
Hepatitis B Surface Ag: NONREACTIVE

## 2021-04-10 LAB — RPR: RPR Ser Ql: NONREACTIVE

## 2021-04-10 LAB — TSH: TSH: 1.36 u[IU]/mL (ref 0.350–4.500)
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
## 2021-04-11 LAB — VITAMIN B1: Vitamin B1 (Thiamine): 207.2 nmol/L — ABNORMAL HIGH (ref 66.5–200.0)
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
## 2021-04-12 ENCOUNTER — Encounter (HOSPITAL_COMMUNITY): Payer: Self-pay | Admitting: Orthopedic Surgery

## 2021-04-12 LAB — BASIC METABOLIC PANEL
Anion gap: 12 (ref 5–15)
BUN: 13 mg/dL (ref 6–20)
CO2: 23 mmol/L (ref 22–32)
Calcium: 9.9 mg/dL (ref 8.9–10.3)
Chloride: 101 mmol/L (ref 98–111)
Creatinine, Ser: 0.82 mg/dL (ref 0.61–1.24)
GFR, Estimated: >60 mL/min (ref >60)
Glucose, Bld: 167 mg/dL — ABNORMAL HIGH (ref 70–99)
Potassium: 4.8 mmol/L (ref 3.5–5.1)
Sodium: 136 mmol/L (ref 135–145)

## 2021-04-12 MED ORDER — CHLORDIAZEPOXIDE HCL 25 MG PO CAPS
25.0000 mg | ORAL_CAPSULE | Freq: Three times a day (TID) | ORAL | Status: DC
  Administered 2021-04-12 – 2021-04-19 (×21): 25 mg via ORAL
  Filled 2021-04-12 (×20): qty 1
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
## 2021-04-14 MED ORDER — SPIRITUS FRUMENTI
1.0000 | Freq: Every day | ORAL | Status: DC
  Administered 2021-04-14 – 2021-04-15 (×2): 1 via ORAL
  Filled 2021-04-14 (×3): qty 1
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
## 2021-04-14 NOTE — TOC Progression Note (Signed)
Transition of Care [facility]) - Progression Note    Patient Details  Name: Dacotah Cabello MRN: [id number] Date of Birth: [birth year]  Transition [facility] CM/SW Contact  Glennon Mac, RN Phone Number: 04/14/2021, 1100  Clinical Narrative:    Continued attempts to reach patient's family members: Patient states that his sister, Vilinda Blanks lives in [place], [place] at [address] and her phone number is [phone].  Called this number with no success; wrong number. [employer] has no record of a [employer] on file.  [facility] has been notified of need for guardianship; await clinical social worker review.  Will follow with updates as available.  Expected Discharge Plan: Skilled Nursing Facility Barriers to Discharge: Continued Medical Work up  Expected Discharge Plan and Services Expected Discharge Plan: Skilled Nursing Facility   Discharge Planning Services: CM Consult   Living arrangements for the past 2 months: Homeless                                       Social Determinants of Health (SDOH) Interventions    Readmission Risk Interventions No flowsheet data found.  Quintella Baton, RN, BSN  Trauma/Neuro ICU Case Manager [phone]
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
## 2021-04-15 ENCOUNTER — Inpatient Hospital Stay (HOSPITAL_COMMUNITY): Payer: No Typology Code available for payment source

## 2021-04-15 DIAGNOSIS — F209 Schizophrenia, unspecified: Secondary | ICD-10-CM
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
## 2021-04-16 MED ORDER — QUETIAPINE FUMARATE 50 MG PO TABS
25.0000 mg | ORAL_TABLET | Freq: Two times a day (BID) | ORAL | Status: DC
  Administered 2021-04-16 – 2021-04-20 (×9): 25 mg via ORAL
  Filled 2021-04-16 (×10): qty 1
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
## 2021-04-17 ENCOUNTER — Other Ambulatory Visit: Payer: Self-pay

## 2021-04-17 LAB — TROPONIN I (HIGH SENSITIVITY): Troponin I (High Sensitivity): 3 ng/L (ref <18)

## 2021-04-17 MED ORDER — HALOPERIDOL LACTATE 5 MG/ML IJ SOLN
5.0000 mg | Freq: Four times a day (QID) | INTRAMUSCULAR | Status: DC | PRN
  Administered 2021-04-18 – 2021-04-22 (×8): 5 mg via INTRAMUSCULAR
  Filled 2021-04-17 (×9): qty 1
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
## 2021-04-17 NOTE — Progress Notes (Signed)
Pt noted to be very restless and agitated most of the shift. Pt has been asleep for the last 4 hours. PRN robaxin and haldol given for pain and agitation. Partial effects noted. Pt noted to continue to be impulsive, talking loudly, confused, attempting to get up unassisted, pulling off condom catheter several times. Incontinent care provided by staff. Pt remains in restraints and has telesitter in place. Pt requiring frequent redirection by staff.

## 2021-04-18 NOTE — Progress Notes (Signed)
10 Days Post-Op   Subjective/Chief Complaint: Complains of soreness all over. Still not oriented to place or time but pleasant today   Objective: Vital signs in last 24 hours: Temp:  [97.6 F (36.4 C)-98 F (36.7 C)] 97.8 F (36.6 C) (10/23 0825) Pulse Rate:  [80-106] 106 (10/23 0825) Resp:  [18] 18 (10/23 0825) BP: (131-140)/(69-90) 131/90 (10/23 0825) SpO2:  [96 %-100 %] 96 % (10/23 0825) Last BM Date: 04/12/21  Intake/Output from previous day: 10/22 0701 - 10/23 0700 In: 927 [P.O.:927] Out: 325 [Urine:325] Intake/Output this shift: No intake/output data recorded.  General appearance: alert and cooperative Resp: clear to auscultation bilaterally Cardio: regular rate and rhythm GI: soft, nontender  Lab Results:  No results for input(s): WBC, HGB, HCT, PLT in the last 72 hours. BMET No results for input(s): NA, K, CL, CO2, GLUCOSE, BUN, CREATININE, CALCIUM in the last 72 hours. PT/INR No results for input(s): LABPROT, INR in the last 72 hours. ABG No results for input(s): PHART, HCO3 in the last 72 hours.  Invalid input(s): PCO2, PO2  Studies/Results: No results found.  Anti-infectives: Anti-infectives (From admission, onward)    Start     Dose/Rate Route Frequency Ordered Stop   04/08/21 2100  ceFAZolin (ANCEF) IVPB 2g/100 mL premix        2 g 200 mL/hr over 30 Minutes Intravenous Every 8 hours 04/08/21 2000 04/09/21 1504   04/08/21 1300  ceFAZolin (ANCEF) IVPB 2g/100 mL premix        2 g 200 mL/hr over 30 Minutes Intravenous  Once 04/07/21 1039 04/08/21 1322   03/31/21 0600  cefTRIAXone (ROCEPHIN) 2 g in sodium chloride 0.9 % 100 mL IVPB        2 g 200 mL/hr over 30 Minutes Intravenous Every 24 hours 03/30/21 1611 04/02/21 0638   03/30/21 0953  vancomycin (VANCOCIN) powder  Status:  Discontinued          As needed 03/30/21 0953 03/30/21 1423   03/28/21 0600  cefTRIAXone (ROCEPHIN) 2 g in sodium chloride 0.9 % 100 mL IVPB        2 g 200 mL/hr over 30  Minutes Intravenous Every 24 hours 03/28/21 0341 03/30/21 0616   03/28/21 0308  vancomycin (VANCOCIN) powder  Status:  Discontinued          As needed 03/28/21 0308 03/28/21 0323   03/27/21 2245  ceFAZolin (ANCEF) IVPB 2g/100 mL premix        2 g 200 mL/hr over 30 Minutes Intravenous  Once 03/27/21 2242 03/28/21 0036       Assessment/Plan: s/p Procedure(s): OPEN REDUCTION INTERNAL FIXATION (ORIF) TIBIAL PLATEAU (Right) REMOVAL EXTERNAL FIXATION LEG (Right) Advance diet PHBC   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 No mass effect. - Dr. Dutch Quint following.  No repeat imaging needed. Keppra x7d; librium increased 10/9 AM Scalp lac - s/p repair with staples 10/1, staples DC on 10/12 R proximal humerus FX - ORIF 10/4 by Dr. Carola Frost, NWB RUE R scapula FX - per Ortho, sling, NWB RUE Open R tib/fib fx: s/p ex fix by Dr. Blanchie Dessert 10/2, s/p adjustment ex fix and insertion antibiotic spacer by Dr. Carola Frost 10/4, ORIF 10/13 by Dr. Carola Frost. NWB RLE L ACL and lateral meniscus injury - MRI 10/3, per ortho plan for nonop, WBAT in hinge brace, will need repeat MRI due to motion artifact on initial imaging. ID - completed ceftriaxone per Trauma Ortho Acute blood loss anemia: Hgb  stable. S/p 2u PRBCs 10/4  in OR.  Alcohol dependence - Etoh 299 on admission. CIWA. SW consult. Decreased beer to once daily 10/19, and continue librium TID - librium increased to 25 mg BID on 10/17 Tobacco abuse DVT prophylaxis: lovenox HTN- monitor,stable currently FEN - Regular, KVO IVF, beer BID, sorbitol, miralax, colace; haldol 5 mg q6h PRN for agitation not controlled with PO meds.   Dispo: med surg, TBI team therapies - recommending SNF. D/C spiritus frumenti. May benefit from starting some seroquel.    Pscyh following - patient does not have capacity to make medical decisions. Continue olanzapine.   LOS: 21 days    Jerome [name] III 04/18/2021

## 2021-04-19 MED ORDER — CHLORDIAZEPOXIDE HCL 5 MG PO CAPS: 5.0000 mg | ORAL_CAPSULE | Freq: Two times a day (BID) | ORAL | Status: DC

## 2021-04-19 MED ORDER — CHLORDIAZEPOXIDE HCL 5 MG PO CAPS: 5.0000 mg | ORAL_CAPSULE | Freq: Every day | ORAL | Status: DC

## 2021-04-19 MED ORDER — CHLORDIAZEPOXIDE HCL 25 MG PO CAPS: 25.0000 mg | ORAL_CAPSULE | Freq: Two times a day (BID) | ORAL | Status: DC

## 2021-04-19 MED ORDER — CHLORDIAZEPOXIDE HCL 5 MG PO CAPS
5.0000 mg | ORAL_CAPSULE | Freq: Every day | ORAL | Status: AC
  Administered 2021-04-19 – 2021-04-20 (×2): 5 mg via ORAL
  Filled 2021-04-19 (×2): qty 1

## 2021-04-19 MED ORDER — CHLORDIAZEPOXIDE HCL 5 MG PO CAPS
10.0000 mg | ORAL_CAPSULE | Freq: Two times a day (BID) | ORAL | Status: AC
  Administered 2021-04-21 – 2021-04-23 (×4): 10 mg via ORAL
  Filled 2021-04-19 (×4): qty 2

## 2021-04-19 MED ORDER — CHLORDIAZEPOXIDE HCL 25 MG PO CAPS
25.0000 mg | ORAL_CAPSULE | Freq: Two times a day (BID) | ORAL | Status: AC
  Administered 2021-04-19 – 2021-04-21 (×4): 25 mg via ORAL
  Filled 2021-04-19 (×3): qty 1

## 2021-04-19 MED ORDER — CHLORDIAZEPOXIDE HCL 5 MG PO CAPS
5.0000 mg | ORAL_CAPSULE | Freq: Two times a day (BID) | ORAL | Status: AC
  Administered 2021-04-23 – 2021-04-25 (×4): 5 mg via ORAL
  Filled 2021-04-19 (×4): qty 1

## 2021-04-19 MED ORDER — CHLORDIAZEPOXIDE HCL 5 MG PO CAPS: 10.0000 mg | ORAL_CAPSULE | Freq: Two times a day (BID) | ORAL | Status: DC

## 2021-04-19 NOTE — Plan of Care (Signed)
  Problem: Safety: Goal: Non-violent Restraint(s) Outcome: Progressing   
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
## 2021-04-20 MED ORDER — OLANZAPINE 5 MG PO TABS
2.5000 mg | ORAL_TABLET | Freq: Every day | ORAL | Status: DC
  Administered 2021-04-21 – 2021-05-11 (×20): 2.5 mg via ORAL
  Filled 2021-04-20 (×22): qty 1
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
## 2021-04-20 NOTE — Discharge Instructions (Addendum)
Right Upper Extremity - Please remain non weight bearing to the right upper extremity. No active shoulder abduction otherwise no range of motion restrictions.   Right Lower Extremity - Please remain non weight bearing to the right lower extremity.   Left Lower Extremity - Weight bearing as tolerated in hinge brace  ----------------------------------------------------------------------------------------------------------------------------------------------------------------------------
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
## 2021-04-22 MED ORDER — HALOPERIDOL LACTATE 5 MG/ML IJ SOLN
2.0000 mg | Freq: Four times a day (QID) | INTRAMUSCULAR | Status: DC | PRN
  Administered 2021-04-25 – 2021-04-27 (×2): 2 mg via INTRAMUSCULAR
  Filled 2021-04-22 (×2): qty 1

## 2021-04-22 NOTE — Plan of Care (Signed)
  Problem: Safety: Goal: Non-violent Restraint(s) Outcome: Progressing   Problem: Education: Goal: Knowledge of General Education information will improve Description: Including pain rating scale, medication(s)/side effects and non-pharmacologic comfort measures Outcome: Progressing   Problem: Health Behavior/Discharge Planning: Goal: Ability to manage health-related needs will improve Outcome: Progressing   Problem: Clinical Measurements: Goal: Ability to maintain clinical measurements within normal limits will improve Outcome: Progressing Goal: Will remain free from infection Outcome: Progressing Goal: Diagnostic test results will improve Outcome: Progressing Goal: Respiratory complications will improve Outcome: Progressing Goal: Cardiovascular complication will be avoided Outcome: Progressing   Problem: Activity: Goal: Risk for activity intolerance will decrease Outcome: Progressing   Problem: Nutrition: Goal: Adequate nutrition will be maintained Outcome: Progressing   Problem: Coping: Goal: Level of anxiety will decrease Outcome: Progressing   Problem: Elimination: Goal: Will not experience complications related to bowel motility Outcome: Progressing Goal: Will not experience complications related to urinary retention Outcome: Progressing   Problem: Pain Managment: Goal: General experience of comfort will improve Outcome: Progressing   Problem: Safety: Goal: Ability to remain free from injury will improve Outcome: Progressing   Problem: Skin Integrity: Goal: Risk for impaired skin integrity will decrease Outcome: Progressing   Problem: Education: Goal: Required Educational Video(s) Outcome: Progressing   Problem: Clinical Measurements: Goal: Ability to maintain clinical measurements within normal limits will improve Outcome: Progressing Goal: Postoperative complications will be avoided or minimized Outcome: Progressing   Problem: Skin  Integrity: Goal: Demonstration of wound healing without infection will improve Outcome: Progressing   Problem: Education: Goal: Ability to demonstrate appropriate child care will improve Outcome: Progressing Goal: Ability to verbalize an understanding of newborn treatment and procedures will improve Outcome: Progressing Goal: Ability to demonstrate an understanding of appropriate nutrition and feeding will improve Outcome: Progressing Goal: Individualized Educational Video(s) Outcome: Progressing   Problem: Nutritional: Goal: Nutritional status of the infant will improve as evidenced by minimal weight loss and appropriate weight gain for gestational age Outcome: Progressing Goal: Ability to maintain a balanced intake and output will improve Outcome: Progressing   Problem: Clinical Measurements: Goal: Ability to maintain clinical measurements within normal limits will improve Outcome: Progressing   Problem: Skin Integrity: Goal: Risk for impaired skin integrity will decrease Outcome: Progressing Goal: Demonstrates signs of wound healing without infection Outcome: Progressing   
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
## 2021-04-23 NOTE — TOC Progression Note (Signed)
Transition of Care [facility]) - Progression Note    Patient Details  Name: Jerome [name] MRN: [id number] Date of Birth: March 11, 1961  Transition of Care [facility]) CM/SW Contact  Glennon Mac, RN Phone Number: 04/23/2021, 1:32 PM  Clinical Narrative:    Received call from Ms. Dalene Carrow at [facility] requesting update on patient.  Information given as requested.   Expected Discharge Plan: Skilled Nursing Facility Barriers to Discharge: Continued Medical Work up  Expected Discharge Plan and Services Expected Discharge Plan: Skilled Nursing Facility   Discharge Planning Services: CM Consult   Living arrangements for the past 2 months: Homeless                                       Social Determinants of Health (SDOH) Interventions    Readmission Risk Interventions No flowsheet data found.  Quintella Baton, RN, BSN  Trauma/Neuro ICU Case Manager [phone]

## 2021-04-23 NOTE — Progress Notes (Signed)
15 Days Post-Op  Subjective: CC: Very calm in his veil bed this morning.  Says he is in [place], [place].  Thinks it's 1999, but cooperative and pleasant enough.  Gave him his breakfast so he can feed himself.  He was appreciative.  Objective: Vital signs in last 24 hours: Temp:  [97.4 F (36.3 C)-100.1 F (37.8 C)] 97.4 F (36.3 C) (10/28 0535) Pulse Rate:  [101-113] 108 (10/28 0535) Resp:  [18] 18 (10/27 2116) BP: (117-150)/(63-92) 134/86 (10/28 0535) SpO2:  [100 %] 100 % (10/28 0535) Last BM Date: 04/22/21  Intake/Output from previous day: 10/27 0701 - 10/28 0700 In: 940 [P.O.:940] Out: 850 [Urine:850] Intake/Output this shift: No intake/output data recorded.  PE: Gen: Lying in bed, comfortable. Neuro: non-focal exam, following commands  HEENT: PERRL; scalp wound with partial dehiscence, but healing well Neck: supple CV: RRR Pulm: CTA b/l, normal rate and effort Abd: Soft, ND, NT Extr: wwp, no edema, restraints in place on BUE. RUE wound well healed with sutures still in place. No KI on currently. RLE wound well healed. LE wwp.  Lab Results:  No results for input(s): WBC, HGB, HCT, PLT in the last 72 hours. BMET No results for input(s): NA, K, CL, CO2, GLUCOSE, BUN, CREATININE, CALCIUM in the last 72 hours. PT/INR No results for input(s): LABPROT, INR in the last 72 hours. CMP     Component Value Date/Time   NA 136 04/12/2021 0945   K 4.8 04/12/2021 0945   CL 101 04/12/2021 0945   CO2 23 04/12/2021 0945   GLUCOSE 167 (H) 04/12/2021 0945   BUN 13 04/12/2021 0945   CREATININE 0.82 04/12/2021 0945   CALCIUM 9.9 04/12/2021 0945   PROT 8.3 (H) 04/08/2021 0917   ALBUMIN 2.9 (L) 04/08/2021 0917   AST 46 (H) 04/08/2021 0917   ALT 42 04/08/2021 0917   ALKPHOS 85 04/08/2021 0917   BILITOT 1.3 (H) 04/08/2021 0917   GFRNONAA >60 04/12/2021 0945   Lipase  No results found for: LIPASE  Studies/Results: No results found.  Anti-infectives: Anti-infectives (From  admission, onward)    Start     Dose/Rate Route Frequency Ordered Stop   04/08/21 2100  ceFAZolin (ANCEF) IVPB 2g/100 mL premix        2 g 200 mL/hr over 30 Minutes Intravenous Every 8 hours 04/08/21 2000 04/09/21 1504   04/08/21 1300  ceFAZolin (ANCEF) IVPB 2g/100 mL premix        2 g 200 mL/hr over 30 Minutes Intravenous  Once 04/07/21 1039 04/08/21 1322   03/31/21 0600  cefTRIAXone (ROCEPHIN) 2 g in sodium chloride 0.9 % 100 mL IVPB        2 g 200 mL/hr over 30 Minutes Intravenous Every 24 hours 03/30/21 1611 04/02/21 0638   03/30/21 0953  vancomycin (VANCOCIN) powder  Status:  Discontinued          As needed 03/30/21 0953 03/30/21 1423   03/28/21 0600  cefTRIAXone (ROCEPHIN) 2 g in sodium chloride 0.9 % 100 mL IVPB        2 g 200 mL/hr over 30 Minutes Intravenous Every 24 hours 03/28/21 0341 03/30/21 0616   03/28/21 0308  vancomycin (VANCOCIN) powder  Status:  Discontinued          As needed 03/28/21 0308 03/28/21 0323   03/27/21 2245  ceFAZolin (ANCEF) IVPB 2g/100 mL premix        2 g 200 mL/hr over 30 Minutes Intravenous  Once 03/27/21 2242 03/28/21  0036        Assessment/Plan 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 No mass effect. - Dr. Dutch Quint following.  No repeat imaging needed. Keppra x7d Scalp lac - s/p repair with staples 10/1, staples DC on 10/12 R proximal humerus FX - ORIF 10/4 by Dr. Carola Frost, NWB RUE. No active shoulder abduction otherwise no rom restrictions. Ortho plans to remove sutures in ~1 week per note on 10/21 R scapula FX - per Ortho, sling, NWB RUE Open R tib/fib fx: s/p ex fix by Dr. Blanchie Dessert 10/2, s/p adjustment ex fix and insertion antibiotic spacer by Dr. Carola Frost 10/4, ORIF 10/13 by Dr. Carola Frost. NWB RLE x 8 weeks. Will need to return to the OR in ~3-4 weeks for removal of abx spacer and grafting or proximal tibial defect per ortho notes. They plan to remove RLE sutures in ~2 weeks (per note on 10/21) L ACL and lateral meniscus injury - MRI  10/3, per ortho plan for nonop, WBAT in hinge brace, will need repeat MRI due to motion artifact on initial imaging.  Acute blood loss anemia: Hgb stable on last labs 10/15. S/p 2u PRBCs 10/4 in OR.  Alcohol dependence - Etoh 299 on admission. CIWA. SW consult. Off Beer. On Librium taper Tobacco abuse Agitation - cooperative this am, but many cuss words and agitation as he is hungry. Pscyh following and planning to see 1x/week. Last seen on 10/25. Per their notes, patient does not have capacity to make medical decisions. Continue Olanzapine (increased 10/27 per psych)/Zyprexa.  Haldol 2 mg q6h PRN for agitation not controlled with PO meds (EKG 10/22 w/ QTc 437).  Doing well thus far in veil bed HTN- monitor,stable currently. PRN meds FEN - Regular, bowel regimen. SLIV  ID - completed ceftriaxone per Trauma Ortho DVT prophylaxis: lovenox Foley - none, voiding Dispo: med surg, TBI team therapies - recommending SNF. Difficult to place referral likely needed    LOS: 26 days    Jerome [name] , [facility] 04/23/2021, 8:26 AM Please see Amion for pager number during day hours 7:00am-4:30pm

## 2021-04-23 NOTE — Plan of Care (Signed)
  Problem: Safety: Goal: Non-violent Restraint(s) Outcome: Progressing   Problem: Education: Goal: Knowledge of General Education information will improve Description: Including pain rating scale, medication(s)/side effects and non-pharmacologic comfort measures Outcome: Progressing   Problem: Health Behavior/Discharge Planning: Goal: Ability to manage health-related needs will improve Outcome: Progressing   Problem: Clinical Measurements: Goal: Ability to maintain clinical measurements within normal limits will improve Outcome: Progressing Goal: Will remain free from infection Outcome: Progressing Goal: Diagnostic test results will improve Outcome: Progressing Goal: Respiratory complications will improve Outcome: Progressing Goal: Cardiovascular complication will be avoided Outcome: Progressing   Problem: Activity: Goal: Risk for activity intolerance will decrease Outcome: Progressing   Problem: Nutrition: Goal: Adequate nutrition will be maintained Outcome: Progressing   Problem: Coping: Goal: Level of anxiety will decrease Outcome: Progressing   Problem: Elimination: Goal: Will not experience complications related to bowel motility Outcome: Progressing Goal: Will not experience complications related to urinary retention Outcome: Progressing   Problem: Pain Managment: Goal: General experience of comfort will improve Outcome: Progressing   Problem: Safety: Goal: Ability to remain free from injury will improve Outcome: Progressing   Problem: Skin Integrity: Goal: Risk for impaired skin integrity will decrease Outcome: Progressing   Problem: Education: Goal: Required Educational Video(s) Outcome: Progressing   Problem: Clinical Measurements: Goal: Ability to maintain clinical measurements within normal limits will improve Outcome: Progressing Goal: Postoperative complications will be avoided or minimized Outcome: Progressing   Problem: Skin  Integrity: Goal: Demonstration of wound healing without infection will improve Outcome: Progressing   Problem: Education: Goal: Ability to demonstrate appropriate child care will improve Outcome: Progressing Goal: Ability to verbalize an understanding of newborn treatment and procedures will improve Outcome: Progressing Goal: Ability to demonstrate an understanding of appropriate nutrition and feeding will improve Outcome: Progressing Goal: Individualized Educational Video(s) Outcome: Progressing   Problem: Nutritional: Goal: Nutritional status of the infant will improve as evidenced by minimal weight loss and appropriate weight gain for gestational age Outcome: Progressing Goal: Ability to maintain a balanced intake and output will improve Outcome: Progressing   Problem: Clinical Measurements: Goal: Ability to maintain clinical measurements within normal limits will improve Outcome: Progressing   Problem: Skin Integrity: Goal: Risk for impaired skin integrity will decrease Outcome: Progressing Goal: Demonstrates signs of wound healing without infection Outcome: Progressing   

## 2021-04-24 NOTE — Plan of Care (Signed)
  Problem: Safety: Goal: Non-violent Restraint(s) Outcome: Progressing   Problem: Education: Goal: Knowledge of General Education information will improve Description: Including pain rating scale, medication(s)/side effects and non-pharmacologic comfort measures Outcome: Progressing   Problem: Health Behavior/Discharge Planning: Goal: Ability to manage health-related needs will improve Outcome: Progressing   Problem: Clinical Measurements: Goal: Ability to maintain clinical measurements within normal limits will improve Outcome: Progressing Goal: Will remain free from infection Outcome: Progressing Goal: Diagnostic test results will improve Outcome: Progressing Goal: Respiratory complications will improve Outcome: Progressing Goal: Cardiovascular complication will be avoided Outcome: Progressing   Problem: Activity: Goal: Risk for activity intolerance will decrease Outcome: Progressing   Problem: Nutrition: Goal: Adequate nutrition will be maintained Outcome: Progressing   Problem: Coping: Goal: Level of anxiety will decrease Outcome: Progressing   Problem: Elimination: Goal: Will not experience complications related to bowel motility Outcome: Progressing Goal: Will not experience complications related to urinary retention Outcome: Progressing   Problem: Pain Managment: Goal: General experience of comfort will improve Outcome: Progressing   Problem: Safety: Goal: Ability to remain free from injury will improve Outcome: Progressing   Problem: Skin Integrity: Goal: Risk for impaired skin integrity will decrease Outcome: Progressing   Problem: Education: Goal: Required Educational Video(s) Outcome: Progressing   Problem: Clinical Measurements: Goal: Ability to maintain clinical measurements within normal limits will improve Outcome: Progressing Goal: Postoperative complications will be avoided or minimized Outcome: Progressing   Problem: Skin  Integrity: Goal: Demonstration of wound healing without infection will improve Outcome: Progressing   Problem: Education: Goal: Ability to demonstrate appropriate child care will improve Outcome: Progressing Goal: Ability to verbalize an understanding of newborn treatment and procedures will improve Outcome: Progressing Goal: Ability to demonstrate an understanding of appropriate nutrition and feeding will improve Outcome: Progressing Goal: Individualized Educational Video(s) Outcome: Progressing   Problem: Nutritional: Goal: Nutritional status of the infant will improve as evidenced by minimal weight loss and appropriate weight gain for gestational age Outcome: Progressing Goal: Ability to maintain a balanced intake and output will improve Outcome: Progressing   Problem: Clinical Measurements: Goal: Ability to maintain clinical measurements within normal limits will improve Outcome: Progressing   Problem: Skin Integrity: Goal: Risk for impaired skin integrity will decrease Outcome: Progressing Goal: Demonstrates signs of wound healing without infection Outcome: Progressing

## 2021-04-25 NOTE — Progress Notes (Addendum)
LATE ENTRY 10/29  17 Days Post-Op  Subjective: CC: Very calm in his veil bed this morning - ate breakfast.  Thinks it's 1999, but cooperative and pleasant enough.   Objective: Vital signs in last 24 hours: Temp:  [97.8 F (36.6 C)-98.7 F (37.1 C)] 98.3 F (36.8 C) (10/30 0859) Pulse Rate:  [90-109] 97 (10/30 0859) Resp:  [17-18] 18 (10/30 0859) BP: (127-143)/(82-91) 127/91 (10/30 0859) SpO2:  [100 %] 100 % (10/30 0859) Last BM Date: 04/24/21  Intake/Output from previous day: 10/29 0701 - 10/30 0700 In: 770 [P.O.:770] Out: 400 [Urine:400] Intake/Output this shift: No intake/output data recorded.  PE: Gen: Sitting in bed, comfortable. Neuro: non-focal exam, following commands  HEENT: PERRL; scalp wound with partial dehiscence, but healing well Neck: supple CV: RRR Pulm: CTA b/l, normal rate and effort Abd: Soft, ND, NT Extr: wwp, no edema, restraints in place on BUE. RUE wound well healed with sutures still in place. No KI on currently. RLE wound well healed. LE wwp.  Lab Results:  No results for input(s): WBC, HGB, HCT, PLT in the last 72 hours. BMET No results for input(s): NA, K, CL, CO2, GLUCOSE, BUN, CREATININE, CALCIUM in the last 72 hours. PT/INR No results for input(s): LABPROT, INR in the last 72 hours. CMP     Component Value Date/Time   NA 136 04/12/2021 0945   K 4.8 04/12/2021 0945   CL 101 04/12/2021 0945   CO2 23 04/12/2021 0945   GLUCOSE 167 (H) 04/12/2021 0945   BUN 13 04/12/2021 0945   CREATININE 0.82 04/12/2021 0945   CALCIUM 9.9 04/12/2021 0945   PROT 8.3 (H) 04/08/2021 0917   ALBUMIN 2.9 (L) 04/08/2021 0917   AST 46 (H) 04/08/2021 0917   ALT 42 04/08/2021 0917   ALKPHOS 85 04/08/2021 0917   BILITOT 1.3 (H) 04/08/2021 0917   GFRNONAA >60 04/12/2021 0945   Lipase  No results found for: LIPASE  Studies/Results: No results found.  Anti-infectives: Anti-infectives (From admission, onward)    Start     Dose/Rate Route Frequency  Ordered Stop   04/08/21 2100  ceFAZolin (ANCEF) IVPB 2g/100 mL premix        2 g 200 mL/hr over 30 Minutes Intravenous Every 8 hours 04/08/21 2000 04/09/21 1504   04/08/21 1300  ceFAZolin (ANCEF) IVPB 2g/100 mL premix        2 g 200 mL/hr over 30 Minutes Intravenous  Once 04/07/21 1039 04/08/21 1322   03/31/21 0600  cefTRIAXone (ROCEPHIN) 2 g in sodium chloride 0.9 % 100 mL IVPB        2 g 200 mL/hr over 30 Minutes Intravenous Every 24 hours 03/30/21 1611 04/02/21 0638   03/30/21 0953  vancomycin (VANCOCIN) powder  Status:  Discontinued          As needed 03/30/21 0953 03/30/21 1423   03/28/21 0600  cefTRIAXone (ROCEPHIN) 2 g in sodium chloride 0.9 % 100 mL IVPB        2 g 200 mL/hr over 30 Minutes Intravenous Every 24 hours 03/28/21 0341 03/30/21 0616   03/28/21 0308  vancomycin (VANCOCIN) powder  Status:  Discontinued          As needed 03/28/21 0308 03/28/21 0323   03/27/21 2245  ceFAZolin (ANCEF) IVPB 2g/100 mL premix        2 g 200 mL/hr over 30 Minutes Intravenous  Once 03/27/21 2242 03/28/21 0036        Assessment/Plan PHBC TBI/SAH bifrontal +  R parietal and insular regions. No mass effect. - Dr. Dutch Quint following.  No repeat imaging needed. Keppra x7d Scalp lac - s/p repair with staples 10/1, staples DC on 10/12 R proximal humerus FX - ORIF 10/4 by Dr. Carola Frost, NWB RUE. No active shoulder abduction otherwise no rom restrictions. Ortho plans to remove sutures in ~1 week per note on 10/21 R scapula FX - per Ortho, sling, NWB RUE Open R tib/fib fx: s/p ex fix by Dr. Blanchie Dessert 10/2, s/p adjustment ex fix and insertion antibiotic spacer by Dr. Carola Frost 10/4, ORIF 10/13 by Dr. Carola Frost. NWB RLE x 8 weeks. Will need to return to the OR in ~3-4 weeks for removal of abx spacer and grafting or proximal tibial defect per ortho notes. They plan to remove RLE sutures in ~2 weeks (per note on 10/21) L ACL and lateral meniscus injury - MRI 10/3, per ortho plan for nonop, WBAT in hinge brace, will  need repeat MRI due to motion artifact on initial imaging.  Acute blood loss anemia: Hgb stable on last labs 10/15. S/p 2u PRBCs 10/4 in OR.  Alcohol dependence - EtOH 299 on admission. CIWA. SW consult. Off Beer. On Librium taper Tobacco abuse Agitation - cooperative this am, but many cuss words and agitation as he is hungry. Pscyh following and planning to see 1x/week. Last seen on 10/25. Per their notes, patient does not have capacity to make medical decisions. Continue Olanzapine (increased 10/27 per psych)/Zyprexa.  Haldol 2 mg q6h PRN for agitation not controlled with PO meds (EKG 10/22 w/ QTc 437).  Doing well thus far in veil bed HTN- monitor,stable currently. PRN meds FEN - Regular, bowel regimen. SLIV  ID - completed ceftriaxone per Trauma Ortho DVT prophylaxis: lovenox Foley - none, voiding Dispo: med surg, TBI team therapies - recommending SNF. Difficult to place referral likely needed   LOS: 28 days   Marin Olp, MD [facility] Use [email or web address] to contact on call provider

## 2021-04-25 NOTE — Progress Notes (Signed)
LATE ENTRY 10/29  17 Days Post-Op  Subjective: CC: Asleep in bed - awoke to voice. Has yet to ear breakfast.    Objective: Vital signs in last 24 hours: Temp:  [97.8 F (36.6 C)-98.7 F (37.1 C)] 98.3 F (36.8 C) (10/30 0859) Pulse Rate:  [90-109] 97 (10/30 0859) Resp:  [17-18] 18 (10/30 0859) BP: (127-143)/(82-91) 127/91 (10/30 0859) SpO2:  [100 %] 100 % (10/30 0859) Last BM Date: 04/24/21  Intake/Output from previous day: 10/29 0701 - 10/30 0700 In: 770 [P.O.:770] Out: 400 [Urine:400] Intake/Output this shift: No intake/output data recorded.  PE: Gen: Sitting in bed, comfortable. Neuro: non-focal exam, following commands  HEENT: PERRL; scalp wound with partial dehiscence, but healing well Neck: supple CV: RRR Pulm: CTA b/l, normal rate and effort Abd: Soft, ND, NT Extr: wwp, no edema, restraints in place on BUE. RUE wound well healed with sutures still in place. No KI on currently. RLE wound well healed. LE wwp.  Lab Results:  No results for input(s): WBC, HGB, HCT, PLT in the last 72 hours. BMET No results for input(s): NA, K, CL, CO2, GLUCOSE, BUN, CREATININE, CALCIUM in the last 72 hours. PT/INR No results for input(s): LABPROT, INR in the last 72 hours. CMP     Component Value Date/Time   NA 136 04/12/2021 0945   K 4.8 04/12/2021 0945   CL 101 04/12/2021 0945   CO2 23 04/12/2021 0945   GLUCOSE 167 (H) 04/12/2021 0945   BUN 13 04/12/2021 0945   CREATININE 0.82 04/12/2021 0945   CALCIUM 9.9 04/12/2021 0945   PROT 8.3 (H) 04/08/2021 0917   ALBUMIN 2.9 (L) 04/08/2021 0917   AST 46 (H) 04/08/2021 0917   ALT 42 04/08/2021 0917   ALKPHOS 85 04/08/2021 0917   BILITOT 1.3 (H) 04/08/2021 0917   GFRNONAA >60 04/12/2021 0945   Lipase  No results found for: LIPASE  Studies/Results: No results found.  Anti-infectives: Anti-infectives (From admission, onward)    Start     Dose/Rate Route Frequency Ordered Stop   04/08/21 2100  ceFAZolin (ANCEF) IVPB  2g/100 mL premix        2 g 200 mL/hr over 30 Minutes Intravenous Every 8 hours 04/08/21 2000 04/09/21 1504   04/08/21 1300  ceFAZolin (ANCEF) IVPB 2g/100 mL premix        2 g 200 mL/hr over 30 Minutes Intravenous  Once 04/07/21 1039 04/08/21 1322   03/31/21 0600  cefTRIAXone (ROCEPHIN) 2 g in sodium chloride 0.9 % 100 mL IVPB        2 g 200 mL/hr over 30 Minutes Intravenous Every 24 hours 03/30/21 1611 04/02/21 0638   03/30/21 0953  vancomycin (VANCOCIN) powder  Status:  Discontinued          As needed 03/30/21 0953 03/30/21 1423   03/28/21 0600  cefTRIAXone (ROCEPHIN) 2 g in sodium chloride 0.9 % 100 mL IVPB        2 g 200 mL/hr over 30 Minutes Intravenous Every 24 hours 03/28/21 0341 03/30/21 0616   03/28/21 0308  vancomycin (VANCOCIN) powder  Status:  Discontinued          As needed 03/28/21 0308 03/28/21 0323   03/27/21 2245  ceFAZolin (ANCEF) IVPB 2g/100 mL premix        2 g 200 mL/hr over 30 Minutes Intravenous  Once 03/27/21 2242 03/28/21 0036        Assessment/Plan 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 No mass  effect. - Dr. Dutch Quint following.  No repeat imaging needed. Keppra x7d Scalp lac - s/p repair with staples 10/1, staples DC on 10/12 R proximal humerus FX - ORIF 10/4 by Dr. Carola Frost, NWB RUE. No active shoulder abduction otherwise no rom restrictions. Ortho plans to remove sutures in ~1 week per note on 10/21 R scapula FX - per Ortho, sling, NWB RUE Open R tib/fib fx: s/p ex fix by Dr. Blanchie Dessert 10/2, s/p adjustment ex fix and insertion antibiotic spacer by Dr. Carola Frost 10/4, ORIF 10/13 by Dr. Carola Frost. NWB RLE x 8 weeks. Will need to return to the OR in ~3-4 weeks for removal of abx spacer and grafting or proximal tibial defect per ortho notes. They plan to remove RLE sutures in ~2 weeks (per note on 10/21) L ACL and lateral meniscus injury - MRI 10/3, per ortho plan for nonop, WBAT in hinge brace, will need repeat MRI due to motion artifact on initial  imaging.  Acute blood loss anemia: Hgb stable on last labs 10/15. S/p 2u PRBCs 10/4 in OR.  Alcohol dependence - EtOH 299 on admission. CIWA. SW consult. Off Beer. On Librium taper Tobacco abuse Agitation - cooperative this am, but many cuss words and agitation as he is hungry. Pscyh following and planning to see 1x/week. Last seen on 10/25. Per their notes, patient does not have capacity to make medical decisions. Continue Olanzapine (increased 10/27 per psych)/Zyprexa.  Haldol 2 mg q6h PRN for agitation not controlled with PO meds (EKG 10/22 w/ QTc 437).  Doing well thus far in veil bed HTN- monitor,stable currently. PRN meds FEN - Regular, bowel regimen. SLIV  ID - completed ceftriaxone per Trauma Ortho DVT prophylaxis: lovenox Foley - none, voiding Dispo: med surg, TBI team therapies - recommending SNF. Difficult to place referral likely needed   LOS: 28 days   Marin Olp, MD [facility] Surgery Use [email or web address] to contact on call provider

## 2021-04-25 NOTE — Progress Notes (Signed)
Patient is agitated and wanting out of the enclosure bed. I tried redirecting patient and offering him food and drink. He states he will take it but then proceeds to cuss me and verbally assault me. He is yelling obscenities and can be heard in surrounding patient rooms. Haldol administered with little effect. Patient continues to yell obscenities and wants me to "open the door so he can get to his cab". When trying to place the patient on a bed pan he got his feet out of the enclosure bed and was not going to put them back in the bed. He did allow me to put them back in the bed and I was able to close the bed. Continued attempts to redirect the patient.

## 2021-04-26 ENCOUNTER — Inpatient Hospital Stay (HOSPITAL_COMMUNITY): Payer: No Typology Code available for payment source

## 2021-04-26 NOTE — Progress Notes (Signed)
SLP Cancellation Note  Patient Details Name: Jaeson Molstad MRN: [id number] DOB: 09/22/1960   Cancelled treatment:        Reason eval/treat not completed: Pt required hygiene from nurses. SLP will continue to follow for cognitive therapy as pt is able.   Jeannie Done, SLP-Student   [place] [facility] [place]row[place] 04/26/2021, 1:03 PM
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
## 2021-04-26 NOTE — Progress Notes (Signed)
PT Cancellation Note  Patient Details Name: Jerome [name] MRN: [id number] DOB: 05/05/1961   Cancelled Treatment:    Reason Eval/Treat Not Completed: Other (comment)  Needs assistance with hygiene before he will be ready for PT/OT;   Van Clines, [place]  Acute Rehabilitation Services Pager [phone] Office [phone]    [name] 04/26/2021, 12:49 PM
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
## 2021-04-26 NOTE — Progress Notes (Addendum)
Mobility Specialist Progress Note:   04/26/21 1605  Mobility  Activity Transferred to/from [facility]  Level of Assistance +2 (takes two people)  Assistive Device BSC;Other (Comment) (HHA)  Distance Ambulated (ft) 2 ft  Mobility Out of bed for toileting  Mobility Response Tolerated fair  Mobility performed by Mobility specialist;Nurse tech  $Mobility charge 1 Mobility   Pt required maxA to pivot transfer to [facility]. Pt did not maintain WB precautions.   Addison Lank Mobility Specialist  Phone [phone]
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
## 2021-04-27 MED ORDER — OLANZAPINE 5 MG PO TABS
7.5000 mg | ORAL_TABLET | Freq: Every day | ORAL | Status: DC
  Administered 2021-04-27 – 2021-04-29 (×3): 7.5 mg via ORAL
  Filled 2021-04-27 (×3): qty 2

## 2021-04-27 MED ORDER — HALOPERIDOL LACTATE 5 MG/ML IJ SOLN
5.0000 mg | Freq: Four times a day (QID) | INTRAMUSCULAR | Status: DC | PRN
  Administered 2021-04-27 – 2021-04-28 (×2): 5 mg via INTRAMUSCULAR
  Filled 2021-04-27 (×2): qty 1

## 2021-04-27 NOTE — Progress Notes (Addendum)
Pt has been yelling, cussing, extremely agitated. No reasoning with pt.. Haldol 2mg  IM given at this time.

## 2021-04-27 NOTE — Progress Notes (Signed)
SLP Cancellation Note  Patient Details Name: Jerome [name] MRN: [id number] DOB: 25-Jun-1961   Cancelled treatment:        Therapist initiated treatment and pt could not stop verbalizing crude remarks. Encouragement and cues given however he continued with inappropriate statements, cussing and pulled his penis out from under his cover.  Therapist will continue to attempt cognitive/speech treatment but will need to discharge if he is unable to participate.    Royce Macadamia 04/27/2021, 2:20 PM
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
## 2021-04-27 NOTE — Plan of Care (Cosign Needed)
Attempted to see patient; however patient was too drowsy to participate. Patient did not open his eyes and mumbled. Patient shook his head yes, that he was tired and had a had a rough not. Patient indicated that he wanted to rest.   Patient will normally attempt to interact on exams. Patient did receive very early dose of Haldol 2mg  for agitation. Patient likely sedated 2/2 to this. Based on EMR patient appears to be more agitated at night. Will increase patient's night time dose of Zyprexa to 7.5mg  in attempt to accommodate for this pattern.  Plan - Increase Zyprexa from 5mg  QHS to 7.5mg  QHS - Continue 2.5mg  daily Zyprexa

## 2021-04-27 NOTE — TOC Progression Note (Signed)
Transition of Care [facility]) - Progression Note    Patient Details  Name: Oren Barella MRN: [id number] Date of Birth: [birth year]  [facility] CM/SW Contact  Glennon Mac, RN Phone Number: 04/26/2021, 4:00pm  Clinical Narrative:    Patient discussed in multidisciplinary Trauma Rounds.  No progress currently with guardianship issue,and no family has been located.  Dr. Bedelia Person would like this patient placed on the Difficult to Place list, and I have shared this request with Jiles Crocker, Advanced Care Supervisor for [facility] Transitions of Care.    Expected Discharge Plan: Skilled Nursing Facility Barriers to Discharge: Continued Medical Work up  Expected Discharge Plan and Services Expected Discharge Plan: Skilled Nursing Facility   Discharge Planning Services: CM Consult   Living arrangements for the past 2 months: Homeless                                       Social Determinants of Health (SDOH) Interventions    Readmission Risk Interventions No flowsheet data found.  Quintella Baton, RN, BSN  Trauma/Neuro ICU Case Manager [phone]

## 2021-04-27 NOTE — Plan of Care (Signed)
  Problem: Clinical Measurements: Goal: Ability to maintain clinical measurements within normal limits will improve Outcome: Not Progressing Goal: Will remain free from infection Outcome: Progressing Goal: Cardiovascular complication will be avoided Outcome: Progressing   Problem: Nutrition: Goal: Adequate nutrition will be maintained Outcome: Progressing   Problem: Coping: Goal: Level of anxiety will decrease Outcome: Not Progressing
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
## 2021-04-28 MED ORDER — OLANZAPINE 10 MG IM SOLR
2.5000 mg | Freq: Four times a day (QID) | INTRAMUSCULAR | Status: DC | PRN
  Administered 2021-04-28 – 2021-05-22 (×12): 2.5 mg via INTRAMUSCULAR
  Filled 2021-04-28 (×24): qty 10
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
## 2021-04-28 NOTE — Progress Notes (Signed)
Patient woke up and started yelling and shouting crude remarks disturbing patients next to his room.He got more aggressive with his language with staff when entered his room to redirect him. He started to shake the posey bed . This nurse tried to redirect patient but was unsuccessful. Patient continued with threats to harm staff if he was able to get out. Called security for assistance. Administered Haldol 5 mg PRN. Will continue to monitor.
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
## 2021-04-29 MED ORDER — STERILE WATER FOR INJECTION IJ SOLN
INTRAMUSCULAR | Status: AC
  Filled 2021-04-29: qty 10
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
## 2021-04-29 NOTE — Progress Notes (Signed)
Pt getting more agitated, yelling and cussing. PRN Zyprexa 2.5mg  IM given at this time.

## 2021-04-30 MED ORDER — OLANZAPINE 5 MG PO TABS
10.0000 mg | ORAL_TABLET | Freq: Every day | ORAL | Status: DC
  Administered 2021-04-30 – 2021-05-10 (×11): 10 mg via ORAL
  Filled 2021-04-30 (×12): qty 2
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
## 2021-05-01 MED ORDER — STERILE WATER FOR INJECTION IJ SOLN
INTRAMUSCULAR | Status: AC
  Filled 2021-05-01: qty 10
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
## 2021-05-02 MED ORDER — STERILE WATER FOR INJECTION IJ SOLN
INTRAMUSCULAR | Status: AC
  Filled 2021-05-02: qty 10

## 2021-05-02 MED ORDER — STERILE WATER FOR INJECTION IJ SOLN
INTRAMUSCULAR | Status: AC
  Administered 2021-05-02: 10 mL
  Filled 2021-05-02: qty 10

## 2021-05-02 NOTE — Progress Notes (Signed)
Pt has become loud, cussing, very agitated. PRN med for agitation  given at this time. Sitter remains at bedside. Will continue to monitor.
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
## 2021-05-03 DIAGNOSIS — Z720 Tobacco use: Secondary | ICD-10-CM

## 2021-05-03 DIAGNOSIS — Z789 Other specified health status: Secondary | ICD-10-CM
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
## 2021-05-03 NOTE — Progress Notes (Signed)
CSW attempted to reach Jerome [name] at [place] [facility] to obtain updates - no answer, a voicemail was left requesting a return call.  Edwin Dada, MSW, LCSW Transitions of Care  Clinical Social Worker II [phone]
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
## 2021-05-04 DIAGNOSIS — S062X1A Diffuse traumatic brain injury with loss of consciousness of 30 minutes or less, initial encounter: Secondary | ICD-10-CM

## 2021-05-04 DIAGNOSIS — S069XAA Unspecified intracranial injury with loss of consciousness status unknown, initial encounter: Secondary | ICD-10-CM

## 2021-05-04 DIAGNOSIS — F2089 Other schizophrenia: Secondary | ICD-10-CM

## 2021-05-04 DIAGNOSIS — S069X1A Unspecified intracranial injury with loss of consciousness of 30 minutes or less, initial encounter: Secondary | ICD-10-CM

## 2021-05-04 LAB — COMPREHENSIVE METABOLIC PANEL
ALT: 50 U/L — ABNORMAL HIGH (ref 0–44)
AST: 39 U/L (ref 15–41)
Albumin: 3.1 g/dL — ABNORMAL LOW (ref 3.5–5.0)
Alkaline Phosphatase: 130 U/L — ABNORMAL HIGH (ref 38–126)
Anion gap: 9 (ref 5–15)
BUN: 11 mg/dL (ref 6–20)
CO2: 28 mmol/L (ref 22–32)
Calcium: 9.8 mg/dL (ref 8.9–10.3)
Chloride: 99 mmol/L (ref 98–111)
Creatinine, Ser: 0.9 mg/dL (ref 0.61–1.24)
GFR, Estimated: >60 mL/min (ref >60)
Glucose, Bld: 207 mg/dL — ABNORMAL HIGH (ref 70–99)
Potassium: 3.7 mmol/L (ref 3.5–5.1)
Sodium: 136 mmol/L (ref 135–145)
Total Bilirubin: 0.9 mg/dL (ref 0.3–1.2)
Total Protein: 7.8 g/dL (ref 6.5–8.1)

## 2021-05-04 LAB — CBC
HCT: 39.3 % (ref 39.0–52.0)
Hemoglobin: 12.7 g/dL — ABNORMAL LOW (ref 13.0–17.0)
MCH: 30.1 pg (ref 26.0–34.0)
MCHC: 32.3 g/dL (ref 30.0–36.0)
MCV: 93.1 fL (ref 80.0–100.0)
Platelets: 276 10*3/uL (ref 150–400)
RBC: 4.22 MIL/uL (ref 4.22–5.81)
RDW: 15.8 % — ABNORMAL HIGH (ref 11.5–15.5)
WBC: 6.9 10*3/uL (ref 4.0–10.5)
nRBC: 0 % (ref 0.0–0.2)

## 2021-05-04 LAB — IRON AND TIBC
Iron: 82 ug/dL (ref 45–182)
Saturation Ratios: 23 % (ref 17.9–39.5)
TIBC: 351 ug/dL (ref 250–450)
UIBC: 269 ug/dL

## 2021-05-04 LAB — RETICULOCYTES
Immature Retic Fract: 12.2 % (ref 2.3–15.9)
RBC.: 4.24 MIL/uL (ref 4.22–5.81)
Retic Count, Absolute: 78.9 10*3/uL (ref 19.0–186.0)
Retic Ct Pct: 1.9 % (ref 0.4–3.1)

## 2021-05-04 LAB — AMMONIA: Ammonia: 31 umol/L (ref 9–35)

## 2021-05-04 LAB — VITAMIN B12: Vitamin B-12: 351 pg/mL (ref 180–914)

## 2021-05-04 LAB — FERRITIN: Ferritin: 154 ng/mL (ref 24–336)

## 2021-05-04 MED ORDER — STERILE WATER FOR INJECTION IJ SOLN
INTRAMUSCULAR | Status: AC
  Filled 2021-05-04: qty 10

## 2021-05-04 MED ORDER — AMLODIPINE BESYLATE 2.5 MG PO TABS
2.5000 mg | ORAL_TABLET | Freq: Every day | ORAL | Status: DC
  Administered 2021-05-04 – 2021-05-06 (×3): 2.5 mg via ORAL
  Filled 2021-05-04 (×3): qty 1
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
## 2021-05-04 NOTE — Progress Notes (Signed)
CSW spoke with Bessie at [facility] to make referral for a care coordinator for patient. Due to patient not being a Medicaid recipient, bessie enrolled patient into system. After accepted into the system, [facility] staff will return call to CSW to make a care coordination referral.  Edwin Dada, MSW, LCSW Transitions of Care  Clinical Social Worker II [phone]

## 2021-05-05 ENCOUNTER — Inpatient Hospital Stay (HOSPITAL_COMMUNITY): Payer: No Typology Code available for payment source

## 2021-05-05 DIAGNOSIS — F203 Undifferentiated schizophrenia: Secondary | ICD-10-CM

## 2021-05-05 LAB — LIPID PANEL
Cholesterol: 200 mg/dL (ref 0–200)
HDL: 74 mg/dL (ref >40)
LDL Cholesterol: 107 mg/dL — ABNORMAL HIGH (ref 0–99)
Total CHOL/HDL Ratio: 2.7 RATIO
Triglycerides: 96 mg/dL (ref <150)
VLDL: 19 mg/dL (ref 0–40)

## 2021-05-05 LAB — COMPREHENSIVE METABOLIC PANEL
ALT: 52 U/L — ABNORMAL HIGH (ref 0–44)
AST: 40 U/L (ref 15–41)
Albumin: 3.4 g/dL — ABNORMAL LOW (ref 3.5–5.0)
Alkaline Phosphatase: 134 U/L — ABNORMAL HIGH (ref 38–126)
Anion gap: 11 (ref 5–15)
BUN: 19 mg/dL (ref 6–20)
CO2: 27 mmol/L (ref 22–32)
Calcium: 9.8 mg/dL (ref 8.9–10.3)
Chloride: 99 mmol/L (ref 98–111)
Creatinine, Ser: 0.9 mg/dL (ref 0.61–1.24)
GFR, Estimated: >60 mL/min (ref >60)
Glucose, Bld: 113 mg/dL — ABNORMAL HIGH (ref 70–99)
Potassium: 4.1 mmol/L (ref 3.5–5.1)
Sodium: 137 mmol/L (ref 135–145)
Total Bilirubin: 0.7 mg/dL (ref 0.3–1.2)
Total Protein: 8.4 g/dL — ABNORMAL HIGH (ref 6.5–8.1)

## 2021-05-05 LAB — CBC WITH DIFFERENTIAL/PLATELET
Abs Immature Granulocytes: 0.02 10*3/uL (ref 0.00–0.07)
Basophils Absolute: 0 10*3/uL (ref 0.0–0.1)
Basophils Relative: 1 %
Eosinophils Absolute: 0.2 10*3/uL (ref 0.0–0.5)
Eosinophils Relative: 3 %
HCT: 43.5 % (ref 39.0–52.0)
Hemoglobin: 14.1 g/dL (ref 13.0–17.0)
Immature Granulocytes: 0 %
Lymphocytes Relative: 39 %
Lymphs Abs: 3.1 10*3/uL (ref 0.7–4.0)
MCH: 30.3 pg (ref 26.0–34.0)
MCHC: 32.4 g/dL (ref 30.0–36.0)
MCV: 93.5 fL (ref 80.0–100.0)
Monocytes Absolute: 0.5 10*3/uL (ref 0.1–1.0)
Monocytes Relative: 7 %
Neutro Abs: 4 10*3/uL (ref 1.7–7.7)
Neutrophils Relative %: 50 %
Platelets: 285 10*3/uL (ref 150–400)
RBC: 4.65 MIL/uL (ref 4.22–5.81)
RDW: 15.9 % — ABNORMAL HIGH (ref 11.5–15.5)
WBC: 7.9 10*3/uL (ref 4.0–10.5)
nRBC: 0 % (ref 0.0–0.2)

## 2021-05-05 LAB — HEMOGLOBIN A1C
Hgb A1c MFr Bld: 4.9 % (ref 4.8–5.6)
Mean Plasma Glucose: 93.93 mg/dL
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
## 2021-05-05 NOTE — Progress Notes (Signed)
SLP Cancellation Note  Patient Details Name: Jerome [name] MRN: [id number] DOB: [birth year]   Cancelled treatment:        As therapist walking to see another pt, Haylen could be heard screaming loudly. Twenty min later pt heard cussing and yelling at [employer]. Will continue efforts.    Royce Macadamia 05/05/2021, 3:35 PM

## 2021-05-05 NOTE — Progress Notes (Signed)
Patient arrived back to 6 north room 30 alert and oriented. NG tube hooked back to suction. Call light in reach. Bed in lowest position.

## 2021-05-05 NOTE — Progress Notes (Signed)
Unable to measure patients wounds due to patient being very agitated and not wanting to be touched
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
## 2021-05-05 NOTE — Progress Notes (Incomplete)
{  Select Note:[id number]} 

## 2021-05-05 NOTE — Progress Notes (Signed)
CSW attempted to reach Maxie Better at [place] [facility] to obtain updates - no answer, a voicemail was left requesting a return call.  CSW reached out to [place], guardianship Child psychotherapist at [employer] [facility] to request an update - waiting for response.  Edwin Dada, MSW, LCSW Transitions of Care  Clinical Social Worker II [phone]

## 2021-05-05 NOTE — Progress Notes (Incomplete)
I came to do the echo but it was not possible        Jerome [name] 05/05/2021, 1:18 PM

## 2021-05-06 DIAGNOSIS — I10 Essential (primary) hypertension: Secondary | ICD-10-CM

## 2021-05-06 MED ORDER — METHOCARBAMOL 750 MG PO TABS
750.0000 mg | ORAL_TABLET | Freq: Three times a day (TID) | ORAL | Status: DC
  Administered 2021-05-06 – 2021-07-04 (×172): 750 mg via ORAL
  Filled 2021-05-06 (×177): qty 1

## 2021-05-06 MED ORDER — AMLODIPINE BESYLATE 5 MG PO TABS
5.0000 mg | ORAL_TABLET | Freq: Every day | ORAL | Status: DC
  Administered 2021-05-07 – 2021-06-16 (×40): 5 mg via ORAL
  Filled 2021-05-06 (×42): qty 1

## 2021-05-06 MED ORDER — APIXABAN (ELIQUIS) EDUCATION KIT FOR DVT/PE PATIENTS: PACK | Freq: Once | Status: DC

## 2021-05-06 MED ORDER — APIXABAN 2.5 MG PO TABS
2.5000 mg | ORAL_TABLET | Freq: Two times a day (BID) | ORAL | Status: DC
  Administered 2021-05-07 – 2021-07-30 (×166): 2.5 mg via ORAL
  Filled 2021-05-06 (×171): qty 1
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
## 2021-05-06 NOTE — Progress Notes (Addendum)
Pt has been refusing lovenox injection for DVT prophylaxis- will start Eliquis 2.5 mg BID. 
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
## 2021-05-06 NOTE — Progress Notes (Signed)
Patient was very aggressive last night, will not take his Lovenox injection last night. On-call notified. This morning he refused all am care. Will not allow caregivers to go closer to him. We continue to monitor.

## 2021-05-06 NOTE — TOC Progression Note (Signed)
Transition of Care [facility]) - Progression Note    Patient Details  Name: Marckus Hanover MRN: [id number] Date of Birth: 03/16/1961  Transition of Care [facility]) CM/SW [place], RN Phone Number: 05/06/2021, 10:57 AM  Clinical Narrative:    CM met with the patient at the bedside along with Erin Hearing, NP.  The patient was able to verbalize his needs this morning regarding his willingness to receive examination and care.  The patient states that he continues to have right foot and leg pain and he states that he feels better after taking his medications this morning.  The patient continues to have aggressive behaviors with the staff and remains in soft confinement for safety in the [facility] bed at this time.  CM and MSW with DTP Team will continue to follow the patient for [facility] needs but at this time can not move forward with [facility] placement due to the patient's psychiatric needs, lack of family support and aggressive verbal and physical behaviors.   Expected Discharge Plan: [place] Barriers to Discharge: Continued Medical Work up  Expected Discharge Plan and Services Expected Discharge Plan: [place]   Discharge Planning Services: CM Consult   Living arrangements for the past 2 months: Homeless                                       Social Determinants of Health (SDOH) Interventions    Readmission Risk Interventions No flowsheet data found.

## 2021-05-06 NOTE — Progress Notes (Signed)
Speech Language Pathology Treatment:    Patient Details Name: Jerome [name] MRN: [id number] DOB: 06-17-1961 Today's Date: 05/06/2021 Time:  -     Attempted to work with pt earlier today for cognition however he continues with sexual verbalizations and could not be re directed. Therapist aware and has redirected with food in the past however his gains towards goals are limited with behavior. ST to sign off at this time.                 GO                Royce Macadamia  05/06/2021, 4:25 PM  Breck Coons Jerome [name].Ed Nurse, children's [phone] Office [phone]

## 2021-05-07 ENCOUNTER — Inpatient Hospital Stay (HOSPITAL_COMMUNITY): Payer: No Typology Code available for payment source
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
## 2021-05-07 NOTE — Progress Notes (Deleted)
Pt has been refusing lovenox injection for DVT prophylaxis- will start Eliquis 2.5 mg BID.
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
## 2021-05-07 NOTE — Progress Notes (Signed)
Per Maxie Better at [facility], she will petition for guardianship for this patient on Tuesday 05/11/21 after efforts are completed to identify any next of kin.   CSW sent clinicals via secure e-mail to Adell at [facility] for review in attempts to obtain a care coordinator.  Edwin Dada, MSW, LCSW Transitions of Care  Clinical Social Worker II [phone]

## 2021-05-08 DIAGNOSIS — S069X0A Unspecified intracranial injury without loss of consciousness, initial encounter: Secondary | ICD-10-CM

## 2021-05-08 DIAGNOSIS — L89303 Pressure ulcer of unspecified buttock, stage 3: Secondary | ICD-10-CM

## 2021-05-08 DIAGNOSIS — Z5900 Homelessness unspecified: Secondary | ICD-10-CM

## 2021-05-08 MED ORDER — CLONAZEPAM 0.5 MG PO TABS
0.5000 mg | ORAL_TABLET | Freq: Two times a day (BID) | ORAL | Status: DC
  Administered 2021-05-08 – 2021-07-30 (×166): 0.5 mg via ORAL
  Filled 2021-05-08 (×167): qty 1
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
## 2021-05-08 NOTE — Plan of Care (Signed)
  Problem: Safety: Goal: Non-violent Restraint(s) Outcome: Not Progressing   Problem: Education: Goal: Knowledge of General Education information will improve Description: Including pain rating scale, medication(s)/side effects and non-pharmacologic comfort measures Outcome: Not Progressing   Problem: Health Behavior/Discharge Planning: Goal: Ability to manage health-related needs will improve Outcome: Not Progressing   Problem: Clinical Measurements: Goal: Ability to maintain clinical measurements within normal limits will improve Outcome: Not Progressing Goal: Will remain free from infection Outcome: Not Progressing Goal: Diagnostic test results will improve Outcome: Not Progressing Goal: Respiratory complications will improve Outcome: Not Progressing Goal: Cardiovascular complication will be avoided Outcome: Not Progressing   Problem: Activity: Goal: Risk for activity intolerance will decrease Outcome: Not Progressing   Problem: Nutrition: Goal: Adequate nutrition will be maintained Outcome: Not Progressing   Problem: Coping: Goal: Level of anxiety will decrease Outcome: Not Progressing   Problem: Elimination: Goal: Will not experience complications related to bowel motility Outcome: Not Progressing Goal: Will not experience complications related to urinary retention Outcome: Not Progressing   Problem: Pain Managment: Goal: General experience of comfort will improve Outcome: Not Progressing   Problem: Safety: Goal: Ability to remain free from injury will improve Outcome: Not Progressing   Problem: Skin Integrity: Goal: Risk for impaired skin integrity will decrease Outcome: Not Progressing   Problem: Education: Goal: Required Educational Video(s) Outcome: Not Progressing   Problem: Clinical Measurements: Goal: Ability to maintain clinical measurements within normal limits will improve Outcome: Not Progressing Goal: Postoperative complications will be  avoided or minimized Outcome: Not Progressing   Problem: Skin Integrity: Goal: Demonstration of wound healing without infection will improve Outcome: Not Progressing   Problem: Education: Goal: Ability to demonstrate appropriate child care will improve Outcome: Not Progressing Goal: Ability to verbalize an understanding of newborn treatment and procedures will improve Outcome: Not Progressing Goal: Ability to demonstrate an understanding of appropriate nutrition and feeding will improve Outcome: Not Progressing Goal: Individualized Educational Video(s) Outcome: Not Progressing   Problem: Nutritional: Goal: Nutritional status of the infant will improve as evidenced by minimal weight loss and appropriate weight gain for gestational age Outcome: Not Progressing Goal: Ability to maintain a balanced intake and output will improve Outcome: Not Progressing   Problem: Clinical Measurements: Goal: Ability to maintain clinical measurements within normal limits will improve Outcome: Not Progressing   Problem: Skin Integrity: Goal: Risk for impaired skin integrity will decrease Outcome: Not Progressing Goal: Demonstrates signs of wound healing without infection Outcome: Not Progressing   Problem: Safety: Goal: Violent Restraint(s) Outcome: Not Progressing  Patient ID: Jerome [name], male   DOB: 12-24-1960, 60 y.o.   MRN: [id number]  No interest in learning, no evidence of learning. Patient uincooperative and aggressive.  Lidia Collum, RN

## 2021-05-09 MED ORDER — STERILE WATER FOR INJECTION IJ SOLN
INTRAMUSCULAR | Status: AC
  Filled 2021-05-09: qty 10
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
## 2021-05-09 NOTE — Plan of Care (Signed)
Patient ID: [name], male   DOB: October 16, 1960, 60 y.o.   MRN: [id number]  Problem: Safety: Goal: Non-violent Restraint(s) Outcome: Not Progressing   Problem: Education: Goal: Knowledge of General Education information will improve Description: Including pain rating scale, medication(s)/side effects and non-pharmacologic comfort measures Outcome: Not Progressing   Problem: Health Behavior/Discharge Planning: Goal: Ability to manage health-related needs will improve Outcome: Not Progressing   Problem: Clinical Measurements: Goal: Ability to maintain clinical measurements within normal limits will improve Outcome: Not Progressing Goal: Will remain free from infection Outcome: Not Progressing Goal: Diagnostic test results will improve Outcome: Not Progressing Goal: Respiratory complications will improve Outcome: Not Progressing Goal: Cardiovascular complication will be avoided Outcome: Not Progressing   Problem: Activity: Goal: Risk for activity intolerance will decrease Outcome: Not Progressing   Problem: Nutrition: Goal: Adequate nutrition will be maintained Outcome: Not Progressing   Problem: Coping: Goal: Level of anxiety will decrease Outcome: Not Progressing   Problem: Elimination: Goal: Will not experience complications related to bowel motility Outcome: Not Progressing Goal: Will not experience complications related to urinary retention Outcome: Not Progressing   Problem: Pain Managment: Goal: General experience of comfort will improve Outcome: Not Progressing   Problem: Safety: Goal: Ability to remain free from injury will improve Outcome: Not Progressing   Problem: Skin Integrity: Goal: Risk for impaired skin integrity will decrease Outcome: Not Progressing   Problem: Education: Goal: Required Educational Video(s) Outcome: Not Progressing   Problem: Clinical Measurements: Goal: Ability to maintain clinical measurements within normal limits will  improve Outcome: Not Progressing Goal: Postoperative complications will be avoided or minimized Outcome: Not Progressing   Problem: Skin Integrity: Goal: Demonstration of wound healing without infection will improve Outcome: Not Progressing   Problem: Education: Goal: Ability to demonstrate appropriate child care will improve Outcome: Not Progressing Goal: Ability to verbalize an understanding of newborn treatment and procedures will improve Outcome: Not Progressing Goal: Ability to demonstrate an understanding of appropriate nutrition and feeding will improve Outcome: Not Progressing Goal: Individualized Educational Video(s) Outcome: Not Progressing   Problem: Nutritional: Goal: Nutritional status of the infant will improve as evidenced by minimal weight loss and appropriate weight gain for gestational age Outcome: Not Progressing Goal: Ability to maintain a balanced intake and output will improve Outcome: Not Progressing   Problem: Clinical Measurements: Goal: Ability to maintain clinical measurements within normal limits will improve Outcome: Not Progressing   Problem: Skin Integrity: Goal: Risk for impaired skin integrity will decrease Outcome: Not Progressing Goal: Demonstrates signs of wound healing without infection Outcome: Not Progressing   Problem: Safety: Goal: Violent Restraint(s) Outcome: Not Progressing   Patient continues to be resistant to education, making verbal and physical threats to staff members, and disturbing other patients and families with screaming and yelling of obscenities.  Lidia Collum, RN

## 2021-05-10 MED ORDER — STERILE WATER FOR INJECTION IJ SOLN
INTRAMUSCULAR | Status: AC
  Filled 2021-05-10: qty 10
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
## 2021-05-11 LAB — COMPREHENSIVE METABOLIC PANEL
ALT: 53 U/L — ABNORMAL HIGH (ref 0–44)
AST: 46 U/L — ABNORMAL HIGH (ref 15–41)
Albumin: 3.2 g/dL — ABNORMAL LOW (ref 3.5–5.0)
Alkaline Phosphatase: 117 U/L (ref 38–126)
Anion gap: 10 (ref 5–15)
BUN: 13 mg/dL (ref 6–20)
CO2: 25 mmol/L (ref 22–32)
Calcium: 10 mg/dL (ref 8.9–10.3)
Chloride: 103 mmol/L (ref 98–111)
Creatinine, Ser: 0.97 mg/dL (ref 0.61–1.24)
GFR, Estimated: >60 mL/min (ref >60)
Glucose, Bld: 92 mg/dL (ref 70–99)
Potassium: 4.3 mmol/L (ref 3.5–5.1)
Sodium: 138 mmol/L (ref 135–145)
Total Bilirubin: 0.7 mg/dL (ref 0.3–1.2)
Total Protein: 8 g/dL (ref 6.5–8.1)

## 2021-05-11 LAB — LIPASE, BLOOD: Lipase: 31 U/L (ref 11–51)

## 2021-05-11 LAB — AMYLASE: Amylase: 80 U/L (ref 28–100)

## 2021-05-11 MED ORDER — RISPERIDONE 1 MG PO TABS
1.0000 mg | ORAL_TABLET | Freq: Every day | ORAL | Status: DC
Start: 2021-05-12 — End: 2021-05-11

## 2021-05-11 MED ORDER — OLANZAPINE 5 MG PO TABS
10.0000 mg | ORAL_TABLET | Freq: Every day | ORAL | Status: AC
  Administered 2021-05-11: 10 mg via ORAL
  Filled 2021-05-11: qty 2

## 2021-05-11 MED ORDER — RISPERIDONE 1 MG PO TABS
1.0000 mg | ORAL_TABLET | Freq: Every morning | ORAL | Status: DC
  Administered 2021-05-12 – 2021-05-19 (×7): 1 mg via ORAL
  Filled 2021-05-11 (×8): qty 1
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
## 2021-05-11 NOTE — Progress Notes (Addendum)
Mobility Specialist Progress Note:   05/11/21 1305  Mobility  Activity Ambulated in room  Level of Assistance +2 (takes two people)  Assistive Device Front wheel walker  RUE Weight Bearing WBAT  RLE Weight Bearing NWB  LLE Weight Bearing WBAT  Distance Ambulated (ft) 10 ft  Mobility Ambulated with assistance in room  Mobility Response Tolerated well  Mobility performed by Mobility specialist;Nurse tech  $Mobility charge 1 Mobility   NT asked for assistance to assist this pt back to bed from BR.  Addison Lank Mobility Specialist  Phone [phone]
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
## 2021-05-11 NOTE — Progress Notes (Signed)
CSW received confirmation from [place] at [place] that the care coordinator referral was accepted and assigned to [facility] @ [phone].   Edwin Dada, MSW, LCSW Transitions of Care  Clinical Social Worker II [phone]

## 2021-05-11 NOTE — Progress Notes (Addendum)
Mobility Specialist Progress Note:   05/11/21 1240  Mobility  Activity Ambulated to bathroom  Level of Assistance +2 (takes two people)  Assistive Device Front wheel walker  RUE Weight Bearing WBAT  RLE Weight Bearing NWB  LLE Weight Bearing WBAT  Distance Ambulated (ft) 10 ft  Mobility Out of bed for toileting  Mobility Response Tolerated well  Mobility performed by Mobility specialist  $Mobility charge 1 Mobility   NT asked for assistance to assist this pt to BR for shower.   Addison Lank Mobility Specialist  Phone [phone]

## 2021-05-12 LAB — COMPREHENSIVE METABOLIC PANEL
ALT: 57 U/L — ABNORMAL HIGH (ref 0–44)
AST: 41 U/L (ref 15–41)
Albumin: 3.6 g/dL (ref 3.5–5.0)
Alkaline Phosphatase: 164 U/L — ABNORMAL HIGH (ref 38–126)
Anion gap: 9 (ref 5–15)
BUN: 17 mg/dL (ref 6–20)
CO2: 27 mmol/L (ref 22–32)
Calcium: 10 mg/dL (ref 8.9–10.3)
Chloride: 100 mmol/L (ref 98–111)
Creatinine, Ser: 0.88 mg/dL (ref 0.61–1.24)
GFR, Estimated: >60 mL/min (ref >60)
Glucose, Bld: 118 mg/dL — ABNORMAL HIGH (ref 70–99)
Potassium: 4 mmol/L (ref 3.5–5.1)
Sodium: 136 mmol/L (ref 135–145)
Total Bilirubin: 0.6 mg/dL (ref 0.3–1.2)
Total Protein: 9 g/dL — ABNORMAL HIGH (ref 6.5–8.1)

## 2021-05-12 LAB — AMYLASE: Amylase: 88 U/L (ref 28–100)

## 2021-05-12 LAB — LIPASE, BLOOD: Lipase: 34 U/L (ref 11–51)

## 2021-05-12 MED ORDER — CEFAZOLIN SODIUM-DEXTROSE 2-4 GM/100ML-% IV SOLN
2.0000 g | Freq: Three times a day (TID) | INTRAVENOUS | Status: AC
  Administered 2021-05-13: 18:00:00 2 g via INTRAVENOUS
  Filled 2021-05-12 (×4): qty 100

## 2021-05-12 NOTE — Progress Notes (Signed)
IV team at bedside to start IV. Patient yelling and screaming exhibiting aggressive behavior.   Will re-attempt if patient calms down. Will hold IV antibiotics

## 2021-05-12 NOTE — Progress Notes (Signed)
    OVERNIGHT PROGRESS REPORT  Notified by RN for consideration of surgery in AM. Eliquis held for tonight for this scheduled event.    Chinita Greenland MSNA MSN ACNPC-AG Acute Care Nurse Practitioner [facility]

## 2021-05-12 NOTE — Progress Notes (Signed)
PT Cancellation Note  Patient Details Name: Jerome [name] MRN: [id number] DOB: 03-24-1961   Cancelled Treatment:    Reason Eval/Treat Not Completed: Other (comment). Pt unable to participate in therapy today due to pt speaking obscenities, making inappropriate and sexual innuendos and motions to therapist. PT did speak with Mellody Dance, ortho trauma PA, who cleared pt to WB on R UE now but remains R LE NWB for another 2-3 weeks. Acute PT to return as able, as appropriate to progress mobility.  Lewis Shock, PT, DPT Acute Rehabilitation Services Pager #: [phone] Office #: [phone]    Iona Hansen 05/12/2021, 10:26 AM
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
## 2021-05-12 NOTE — Progress Notes (Addendum)
9:20am: CSW spoke Chales Abrahams, NP at [facility] who states this patient has not been served by the agency.  8am: CSW attempted to reach patient's assigned care coordinator Jamiee without success - a voicemail was left requesting a return call.  CSW spoke with [facility] officer regarding patient - per [employer] records, patient has been homeless since 2004 as that was the first time his address was listed as [place] [employer]. Patient does not have any friends or family associated with his file.  CSW spoke with [facility] supervisor regarding an individual's name that was mentioned Truitt Merle) by the patient to Dr. Gasper Sells during an encounter. TOC supervisor stated this individual has already been looked into and the search did not yield any results.  CSW spoke with Maxie Better at [facility] who states the petition was filed for guardianship of the patient - court is scheduled for 06/10/21 at 3pm.  Edwin Dada, MSW, LCSW Transitions of Care  Clinical Social Worker II [phone]

## 2021-05-12 NOTE — Progress Notes (Signed)
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 [place] [address] [phone] Jerome [name] (F)
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
## 2021-05-12 NOTE — Progress Notes (Signed)
SLP Cancellation Note  Patient Details Name: Jerome [name] MRN: [id number] DOB: February 23, 1961   Cancelled treatment:        PT note read and spoke with OT who reported pt is unable to focus and attend to tasks today and is verbalizing obscenities. SLP will attempt tomorrow to reassess cognition.    Royce Macadamia 05/12/2021, 4:13 PM  Breck Coons Lonell Face.Ed Nurse, children's [phone] Office [phone]

## 2021-05-12 NOTE — Progress Notes (Signed)
Unable to measure patients wounds due to pt agitation

## 2021-05-12 NOTE — Progress Notes (Signed)
Pt. Is very agitated, yelling/screaming and cussing. RN stated that he's been reported to be aggressive and uncooperative .Pt. Is place in posey bed.

## 2021-05-13 MED ORDER — VANCOMYCIN HCL 1000 MG/200ML IV SOLN
1000.0000 mg | INTRAVENOUS | Status: AC
  Administered 2021-05-14: 1000 mg via INTRAVENOUS
  Filled 2021-05-13: qty 200
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
## 2021-05-13 NOTE — Progress Notes (Signed)
SLP Cancellation Note  Patient Details Name: Jerome [name] MRN: [id number] DOB: [birth year]   Cancelled treatment:        Attempted to re-evaluate pt's cognition after being reconsulted. Pt is NPO for procedure and had planned to utilize food/liquid into session, however unable. Standing outside his room [name] could be heard screaming, yelling  and irriated. Not appropriate this date. Will plan to follow up tomorrow.    Royce Macadamia 05/13/2021, 10:53 AM

## 2021-05-13 NOTE — Progress Notes (Signed)
CSW spoke with Jake Seats, patient's care coordinator at [facility] to discuss patient. Jake Seats will be unable to assist with placement until there is a pending Medicaid application and consents from a legal guardian.   Edwin Dada, MSW, LCSW Transitions of Care  Clinical Social Worker II [phone]
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
## 2021-05-14 ENCOUNTER — Inpatient Hospital Stay (HOSPITAL_COMMUNITY): Payer: No Typology Code available for payment source | Admitting: Certified Registered Nurse Anesthetist

## 2021-05-14 ENCOUNTER — Encounter (HOSPITAL_COMMUNITY): Admission: EM | Disposition: A | Discharge status: Home / Self Care | Payer: Self-pay | Source: Home / Self Care

## 2021-05-14 ENCOUNTER — Other Ambulatory Visit: Payer: Self-pay

## 2021-05-14 ENCOUNTER — Inpatient Hospital Stay (HOSPITAL_COMMUNITY): Payer: No Typology Code available for payment source

## 2021-05-14 DIAGNOSIS — S069X9S Unspecified intracranial injury with loss of consciousness of unspecified duration, sequela: Secondary | ICD-10-CM

## 2021-05-14 HISTORY — PX: ORIF TIBIA PLATEAU: SHX2132

## 2021-05-14 LAB — PROTIME-INR
INR: 1 (ref 0.8–1.2)
Prothrombin Time: 13.6 seconds (ref 11.4–15.2)

## 2021-05-14 LAB — CBC
HCT: 46 % (ref 39.0–52.0)
Hemoglobin: 14.7 g/dL (ref 13.0–17.0)
MCH: 29.8 pg (ref 26.0–34.0)
MCHC: 32 g/dL (ref 30.0–36.0)
MCV: 93.3 fL (ref 80.0–100.0)
Platelets: 301 10*3/uL (ref 150–400)
RBC: 4.93 MIL/uL (ref 4.22–5.81)
RDW: 14.9 % (ref 11.5–15.5)
WBC: 7 10*3/uL (ref 4.0–10.5)
nRBC: 0 % (ref 0.0–0.2)

## 2021-05-14 SURGERY — OPEN REDUCTION INTERNAL FIXATION (ORIF) TIBIAL PLATEAU
Anesthesia: General | Site: Leg Lower | Laterality: Right

## 2021-05-14 MED ORDER — FENTANYL CITRATE (PF) 100 MCG/2ML IJ SOLN
INTRAMUSCULAR | Status: DC | PRN
  Administered 2021-05-14: 250 ug via INTRAVENOUS

## 2021-05-14 MED ORDER — ONDANSETRON HCL 4 MG/2ML IJ SOLN
INTRAMUSCULAR | Status: AC
  Filled 2021-05-14: qty 2

## 2021-05-14 MED ORDER — ROCURONIUM BROMIDE 10 MG/ML (PF) SYRINGE
PREFILLED_SYRINGE | INTRAVENOUS | Status: DC | PRN
  Administered 2021-05-14: 50 mg via INTRAVENOUS

## 2021-05-14 MED ORDER — EPHEDRINE 5 MG/ML INJ
INTRAVENOUS | Status: AC
  Filled 2021-05-14: qty 5

## 2021-05-14 MED ORDER — PROPOFOL 10 MG/ML IV BOLUS
INTRAVENOUS | Status: AC
  Filled 2021-05-14: qty 20

## 2021-05-14 MED ORDER — KETAMINE HCL 50 MG/5ML IJ SOSY
PREFILLED_SYRINGE | INTRAMUSCULAR | Status: AC
  Filled 2021-05-14: qty 5

## 2021-05-14 MED ORDER — FENTANYL CITRATE (PF) 250 MCG/5ML IJ SOLN
INTRAMUSCULAR | Status: AC
  Filled 2021-05-14: qty 5

## 2021-05-14 MED ORDER — PHENYLEPHRINE HCL (PRESSORS) 10 MG/ML IV SOLN
INTRAVENOUS | Status: DC | PRN
  Administered 2021-05-14: 80 ug via INTRAVENOUS
  Administered 2021-05-14: 200 ug via INTRAVENOUS
  Administered 2021-05-14: 160 ug via INTRAVENOUS

## 2021-05-14 MED ORDER — MIDAZOLAM HCL 2 MG/2ML IJ SOLN
INTRAMUSCULAR | Status: AC
  Filled 2021-05-14: qty 2

## 2021-05-14 MED ORDER — ONDANSETRON HCL 4 MG/2ML IJ SOLN
INTRAMUSCULAR | Status: DC | PRN
  Administered 2021-05-14: 4 mg via INTRAVENOUS

## 2021-05-14 MED ORDER — EPHEDRINE SULFATE-NACL 50-0.9 MG/10ML-% IV SOSY
PREFILLED_SYRINGE | INTRAVENOUS | Status: DC | PRN
  Administered 2021-05-14: 10 mg via INTRAVENOUS

## 2021-05-14 MED ORDER — PHENYLEPHRINE 40 MCG/ML (10ML) SYRINGE FOR IV PUSH (FOR BLOOD PRESSURE SUPPORT)
PREFILLED_SYRINGE | INTRAVENOUS | Status: AC
  Filled 2021-05-14: qty 10

## 2021-05-14 MED ORDER — HYDROMORPHONE HCL 1 MG/ML IJ SOLN
INTRAMUSCULAR | Status: AC
  Filled 2021-05-14: qty 1

## 2021-05-14 MED ORDER — LIDOCAINE 2% (20 MG/ML) 5 ML SYRINGE
INTRAMUSCULAR | Status: AC
  Filled 2021-05-14: qty 5

## 2021-05-14 MED ORDER — PROMETHAZINE HCL 25 MG/ML IJ SOLN: 6.2500 mg | INTRAMUSCULAR | Status: DC | PRN

## 2021-05-14 MED ORDER — KETAMINE HCL 10 MG/ML IJ SOLN
INTRAMUSCULAR | Status: DC | PRN
  Administered 2021-05-14: 50 mg via INTRAVENOUS

## 2021-05-14 MED ORDER — HYDROMORPHONE HCL 1 MG/ML IJ SOLN
0.2500 mg | INTRAMUSCULAR | Status: DC | PRN
  Administered 2021-05-14 (×2): 0.5 mg via INTRAVENOUS

## 2021-05-14 MED ORDER — 0.9 % SODIUM CHLORIDE (POUR BTL) OPTIME
TOPICAL | Status: DC | PRN
  Administered 2021-05-14: 1000 mL

## 2021-05-14 MED ORDER — ROCURONIUM BROMIDE 10 MG/ML (PF) SYRINGE
PREFILLED_SYRINGE | INTRAVENOUS | Status: AC
  Filled 2021-05-14: qty 10

## 2021-05-14 MED ORDER — MIDAZOLAM HCL 5 MG/5ML IJ SOLN
INTRAMUSCULAR | Status: DC | PRN
  Administered 2021-05-14: 2 mg via INTRAVENOUS

## 2021-05-14 MED ORDER — LACTATED RINGERS IV SOLN: INTRAVENOUS | Status: DC | PRN

## 2021-05-14 MED ORDER — PHENYLEPHRINE HCL-NACL 20-0.9 MG/250ML-% IV SOLN
INTRAVENOUS | Status: DC | PRN
  Administered 2021-05-14: 50 ug/min via INTRAVENOUS

## 2021-05-14 MED ORDER — OLANZAPINE 5 MG PO TABS
5.0000 mg | ORAL_TABLET | Freq: Every day | ORAL | Status: DC
  Administered 2021-05-14 – 2021-05-18 (×5): 5 mg via ORAL
  Filled 2021-05-14 (×7): qty 1

## 2021-05-14 MED ORDER — DEXMEDETOMIDINE (PRECEDEX) IN NS 20 MCG/5ML (4 MCG/ML) IV SYRINGE
PREFILLED_SYRINGE | INTRAVENOUS | Status: AC
  Filled 2021-05-14: qty 5

## 2021-05-14 MED ORDER — VANCOMYCIN HCL IN DEXTROSE 1-5 GM/200ML-% IV SOLN
INTRAVENOUS | Status: AC
  Filled 2021-05-14: qty 200

## 2021-05-14 MED ORDER — DEXMEDETOMIDINE (PRECEDEX) IN NS 20 MCG/5ML (4 MCG/ML) IV SYRINGE
PREFILLED_SYRINGE | INTRAVENOUS | Status: DC | PRN
  Administered 2021-05-14: 8 ug via INTRAVENOUS
  Administered 2021-05-14: 4 ug via INTRAVENOUS

## 2021-05-14 MED ORDER — DIVALPROEX SODIUM 250 MG PO DR TAB
250.0000 mg | DELAYED_RELEASE_TABLET | Freq: Two times a day (BID) | ORAL | Status: DC
  Administered 2021-05-14 – 2021-05-18 (×9): 250 mg via ORAL
  Filled 2021-05-14 (×13): qty 1

## 2021-05-14 MED ORDER — PROPOFOL 10 MG/ML IV BOLUS
INTRAVENOUS | Status: DC | PRN
  Administered 2021-05-14: 30 mg via INTRAVENOUS
  Administered 2021-05-14: 100 mg via INTRAVENOUS
  Administered 2021-05-14: 20 mg via INTRAVENOUS

## 2021-05-14 MED ORDER — CEFAZOLIN SODIUM-DEXTROSE 2-4 GM/100ML-% IV SOLN
2.0000 g | Freq: Three times a day (TID) | INTRAVENOUS | Status: AC
  Filled 2021-05-14: qty 100

## 2021-05-14 MED ORDER — OXYCODONE HCL 5 MG PO TABS
ORAL_TABLET | ORAL | Status: AC
  Filled 2021-05-14: qty 2

## 2021-05-14 MED ORDER — MEPERIDINE HCL 25 MG/ML IJ SOLN: 6.2500 mg | INTRAMUSCULAR | Status: DC | PRN

## 2021-05-14 MED ORDER — SUGAMMADEX SODIUM 200 MG/2ML IV SOLN
INTRAVENOUS | Status: DC | PRN
  Administered 2021-05-14: 200 mg via INTRAVENOUS

## 2021-05-14 SURGICAL SUPPLY — 70 items
BAG COUNTER SPONGE SURGICOUNT (BAG) ×2 IMPLANT
BANDAGE ESMARK 6X9 LF (GAUZE/BANDAGES/DRESSINGS) ×1 IMPLANT
BLADE CLIPPER SURG (BLADE) IMPLANT
BLADE SURG 10 STRL SS (BLADE) ×2 IMPLANT
BLADE SURG 15 STRL LF DISP TIS (BLADE) ×1 IMPLANT
BLADE SURG 15 STRL SS (BLADE) ×1
BNDG COHESIVE 4X5 TAN STRL (GAUZE/BANDAGES/DRESSINGS) ×2 IMPLANT
BNDG ELASTIC 4X5.8 VLCR STR LF (GAUZE/BANDAGES/DRESSINGS) ×2 IMPLANT
BNDG ELASTIC 6X5.8 VLCR STR LF (GAUZE/BANDAGES/DRESSINGS) ×2 IMPLANT
BNDG ESMARK 6X9 LF (GAUZE/BANDAGES/DRESSINGS) ×2
BNDG GAUZE ELAST 4 BULKY (GAUZE/BANDAGES/DRESSINGS) ×2 IMPLANT
BONE CANC CHIPS 40CC CAN1/2 (Bone Implant) ×4 IMPLANT
BRUSH SCRUB EZ PLAIN DRY (MISCELLANEOUS) ×4 IMPLANT
CANISTER SUCT 3000ML PPV (MISCELLANEOUS) ×2 IMPLANT
CHIPS CANC BONE 40CC CAN1/2 (Bone Implant) ×2 IMPLANT
COVER SURGICAL LIGHT HANDLE (MISCELLANEOUS) ×2 IMPLANT
CUFF TOURN SGL QUICK 34 (TOURNIQUET CUFF) ×1
CUFF TRNQT CYL 34X4.125X (TOURNIQUET CUFF) ×1 IMPLANT
DRAPE C-ARM 42X72 X-RAY (DRAPES) ×2 IMPLANT
DRAPE C-ARMOR (DRAPES) ×2 IMPLANT
DRAPE HALF SHEET 40X57 (DRAPES) IMPLANT
DRAPE INCISE IOBAN 66X45 STRL (DRAPES) ×2 IMPLANT
DRAPE U-SHAPE 47X51 STRL (DRAPES) ×2 IMPLANT
DRESSING MEPILEX FLEX 4X4 (GAUZE/BANDAGES/DRESSINGS) ×1 IMPLANT
DRSG ADAPTIC 3X8 NADH LF (GAUZE/BANDAGES/DRESSINGS) ×2 IMPLANT
DRSG MEPILEX FLEX 4X4 (GAUZE/BANDAGES/DRESSINGS) ×2
DRSG PAD ABDOMINAL 8X10 ST (GAUZE/BANDAGES/DRESSINGS) ×4 IMPLANT
ELECT REM PT RETURN 9FT ADLT (ELECTROSURGICAL) ×2
ELECTRODE REM PT RTRN 9FT ADLT (ELECTROSURGICAL) ×1 IMPLANT
GAUZE SPONGE 4X4 12PLY STRL (GAUZE/BANDAGES/DRESSINGS) ×2 IMPLANT
GLOVE SRG 8 PF TXTR STRL LF DI (GLOVE) ×1 IMPLANT
GLOVE SURG ENC MOIS LTX SZ7.5 (GLOVE) ×2 IMPLANT
GLOVE SURG ENC MOIS LTX SZ8 (GLOVE) ×2 IMPLANT
GLOVE SURG UNDER POLY LF SZ7.5 (GLOVE) ×2 IMPLANT
GLOVE SURG UNDER POLY LF SZ8 (GLOVE) ×1
GOWN STRL REUS W/ TWL LRG LVL3 (GOWN DISPOSABLE) ×2 IMPLANT
GOWN STRL REUS W/ TWL XL LVL3 (GOWN DISPOSABLE) ×1 IMPLANT
GOWN STRL REUS W/TWL LRG LVL3 (GOWN DISPOSABLE) ×2
GOWN STRL REUS W/TWL XL LVL3 (GOWN DISPOSABLE) ×1
IMMOBILIZER KNEE 22 UNIV (SOFTGOODS) ×2 IMPLANT
KIT BASIN OR (CUSTOM PROCEDURE TRAY) ×2 IMPLANT
KIT INFUSE LRG II (Orthopedic Implant) ×2 IMPLANT
KIT TURNOVER KIT B (KITS) ×2 IMPLANT
NDL SUT 6 .5 CRC .975X.05 MAYO (NEEDLE) IMPLANT
NEEDLE MAYO TAPER (NEEDLE)
NS IRRIG 1000ML POUR BTL (IV SOLUTION) ×2 IMPLANT
PACK ORTHO EXTREMITY (CUSTOM PROCEDURE TRAY) ×2 IMPLANT
PAD ARMBOARD 7.5X6 YLW CONV (MISCELLANEOUS) ×4 IMPLANT
PAD CAST 4YDX4 CTTN HI CHSV (CAST SUPPLIES) ×1 IMPLANT
PADDING CAST COTTON 4X4 STRL (CAST SUPPLIES) ×1
PADDING CAST COTTON 6X4 STRL (CAST SUPPLIES) ×2 IMPLANT
SPONGE T-LAP 18X18 ~~LOC~~+RFID (SPONGE) ×2 IMPLANT
STAPLER VISISTAT 35W (STAPLE) ×2 IMPLANT
STOCKINETTE IMPERVIOUS LG (DRAPES) ×2 IMPLANT
SUCTION FRAZIER HANDLE 10FR (MISCELLANEOUS) ×1
SUCTION TUBE FRAZIER 10FR DISP (MISCELLANEOUS) ×1 IMPLANT
SUT ETHILON 2 0 FS 18 (SUTURE) ×4 IMPLANT
SUT PROLENE 0 CT 2 (SUTURE) ×4 IMPLANT
SUT VIC AB 0 CT1 27 (SUTURE) ×2
SUT VIC AB 0 CT1 27XBRD ANBCTR (SUTURE) ×2 IMPLANT
SUT VIC AB 1 CT1 27 (SUTURE) ×2
SUT VIC AB 1 CT1 27XBRD ANBCTR (SUTURE) ×2 IMPLANT
SUT VIC AB 2-0 CT1 27 (SUTURE) ×3
SUT VIC AB 2-0 CT1 TAPERPNT 27 (SUTURE) ×3 IMPLANT
TOWEL GREEN STERILE (TOWEL DISPOSABLE) ×4 IMPLANT
TOWEL GREEN STERILE FF (TOWEL DISPOSABLE) ×2 IMPLANT
TRAY FOLEY MTR SLVR 16FR STAT (SET/KITS/TRAYS/PACK) IMPLANT
TUBE CONNECTING 12X1/4 (SUCTIONS) ×2 IMPLANT
WATER STERILE IRR 1000ML POUR (IV SOLUTION) ×4 IMPLANT
YANKAUER SUCT BULB TIP NO VENT (SUCTIONS) ×2 IMPLANT

## 2021-05-14 NOTE — Progress Notes (Signed)
OT Cancellation Note  Patient Details Name: Ruven Corradi MRN: [id number] DOB: 03/04/1961   Cancelled Treatment:    Reason Eval/Treat Not Completed: Patient at procedure or test/ unavailable. Pt in surgery for RLE.  Ignacia Palma, OTR/L Acute Rehab Services Pager [phone] Office [phone] '   Evette Georges 05/14/2021, 11:54 AM
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
## 2021-05-14 NOTE — Progress Notes (Signed)
Patient quite pleasant this morning and singing creative, though some explicit, songs of his own making.  I discussed with the patient the risks and benefits of surgery for repair of right tibial plateau nonunion, including the possibility of infection, nerve injury, vessel injury, wound breakdown, arthritis, symptomatic hardware, DVT/ PE, loss of motion, malunion, nonunion, and need for further surgery among others. He acknowledged these risks and wished to proceed.  Myrene Galas, MD [facility], [facility] [phone]

## 2021-05-14 NOTE — Progress Notes (Signed)
Report called to [address] spoke with nurse Rihanna,RN who will taking care of patient in [address] [place] room 15. Nurse verbalized understanding of report.

## 2021-05-14 NOTE — TOC Progression Note (Signed)
Transition of Care [facility]) - Progression Note    Patient Details  Name: Twan Harkin MRN: [id number] Date of Birth: 05/09/1961  Transition of Care [facility]) CM/SW Contact  Janae Bridgeman, RN Phone Number: 05/14/2021, 11:42 AM  Clinical Narrative:    Case management and MSW with DTP Team continue to follow the patient for transitions of care needs.  The patient is returning to the OR today with Dr. Carola Frost for repair of right tibial plateau nonunion.  The patient remains in soft Posey bed for safety and is being followed by psychiatry for medication management / safety and behaviors.   Expected Discharge Plan:  (To be determined.) Barriers to Discharge: Continued Medical Work up  Expected Discharge Plan and Services Expected Discharge Plan:  (To be determined.)   Discharge Planning Services: CM Consult   Living arrangements for the past 2 months: Homeless                                       Social Determinants of Health (SDOH) Interventions    Readmission Risk Interventions No flowsheet data found.

## 2021-05-14 NOTE — Plan of Care (Signed)
  Problem: Clinical Measurements: Goal: Ability to maintain clinical measurements within normal limits will improve Outcome: Progressing Goal: Will remain free from infection Outcome: Progressing Goal: Diagnostic test results will improve Outcome: Progressing Goal: Respiratory complications will improve Outcome: Progressing Goal: Cardiovascular complication will be avoided Outcome: Progressing   Problem: Activity: Goal: Risk for activity intolerance will decrease Outcome: Progressing   Problem: Nutrition: Goal: Adequate nutrition will be maintained Outcome: Progressing   Problem: Elimination: Goal: Will not experience complications related to bowel motility Outcome: Progressing Goal: Will not experience complications related to urinary retention Outcome: Progressing   Problem: Safety: Goal: Non-violent Restraint(s) Outcome: Not Progressing   Problem: Education: Goal: Knowledge of General Education information will improve Description: Including pain rating scale, medication(s)/side effects and non-pharmacologic comfort measures Outcome: Not Progressing   Problem: Health Behavior/Discharge Planning: Goal: Ability to manage health-related needs will improve Outcome: Not Progressing   Problem: Coping: Goal: Level of anxiety will decrease Outcome: Not Progressing   Problem: Pain Managment: Goal: General experience of comfort will improve Outcome: Not Progressing   Problem: Safety: Goal: Ability to remain free from injury will improve Outcome: Not Progressing   Problem: Skin Integrity: Goal: Risk for impaired skin integrity will decrease Outcome: Not Progressing   Problem: Education: Goal: Required Educational Video(s) Outcome: Not Progressing   Problem: Clinical Measurements: Goal: Ability to maintain clinical measurements within normal limits will improve Outcome: Not Progressing Goal: Postoperative complications will be avoided or minimized Outcome: Not  Progressing   Problem: Safety: Goal: Violent Restraint(s) Outcome: Not Progressing

## 2021-05-14 NOTE — Transfer of Care (Addendum)
Immediate Anesthesia Transfer of Care Note  Patient: Jerome [name]  Procedure(s) Performed: Repair of  Right Tibial Nonunion, Removal of Antibiotic Spacer (Right: Leg Lower)  Patient Location: PACU  Anesthesia Type:General  Level of Consciousness: drowsy and responds to stimulation  Airway & Oxygen Therapy: Patient Spontanous Breathing and Patient connected to nasal cannula oxygen  Post-op Assessment: Report given to RN and Post -op Vital signs reviewed and stable  Post vital signs: Reviewed and stable  Last Vitals:  Vitals Value Taken Time  BP 134/85 05/14/21 1325  Temp    Pulse 93 05/14/21 1326  Resp 13 05/14/21 1326  SpO2 100 % 05/14/21 1326  Vitals shown include unvalidated device data.  Last Pain:  Vitals:   05/14/21 0830  TempSrc:   PainSc: 0-No pain      Patients Stated Pain Goal: 3 (05/03/21 2038)  Complications: No notable events documented.

## 2021-05-14 NOTE — Anesthesia Procedure Notes (Signed)
Procedure Name: Intubation Date/Time: 05/14/2021 11:55 AM Performed by: Cathren Harsh, CRNA Pre-anesthesia Checklist: Patient identified, Emergency Drugs available, Suction available and Patient being monitored Patient Re-evaluated:Patient Re-evaluated prior to induction Oxygen Delivery Method: Circle System Utilized Preoxygenation: Pre-oxygenation with 100% oxygen Induction Type: IV induction Ventilation: Mask ventilation without difficulty and Oral airway inserted - appropriate to patient size Laryngoscope Size: Mac and 4 Grade View: Grade II Tube type: Oral Tube size: 7.5 mm Number of attempts: 1 Airway Equipment and Method: Stylet and Oral airway Placement Confirmation: ETT inserted through vocal cords under direct vision, positive ETCO2 and breath sounds checked- equal and bilateral Secured at: 23 cm Tube secured with: Tape Dental Injury: Teeth and Oropharynx as per pre-operative assessment

## 2021-05-14 NOTE — Progress Notes (Signed)
SLP Cancellation Note  Patient Details Name: Jerome [name] MRN: [id number] DOB: 1960-12-11   Cancelled treatment:        Attempted to see pt for cognitive re-evaluation.  Pt remains agitated, inappropriate, and cannot be redirected.  Pt NPO at present for surgery today.  SLP will follow up as appropriate; however, pt may not be able to participate in evaluation 2/2 behavioral issues.   Kerrie Pleasure, MA, CCC-SLP Acute Rehabilitation Services Office: [phone] 05/14/2021, 10:11 AM

## 2021-05-15 ENCOUNTER — Inpatient Hospital Stay (HOSPITAL_COMMUNITY): Payer: No Typology Code available for payment source

## 2021-05-15 MED ORDER — METOPROLOL TARTRATE 25 MG PO TABS
25.0000 mg | ORAL_TABLET | Freq: Once | ORAL | Status: AC
  Administered 2021-05-15: 25 mg via ORAL
  Filled 2021-05-15: qty 1
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
## 2021-05-15 NOTE — Progress Notes (Signed)
   05/15/21 2025  Assess: MEWS Score  Temp 98.4 F (36.9 C)  BP 131/71  Pulse Rate (!) 122  Resp 16  Level of Consciousness Alert  SpO2 94 %  O2 Device Room Air  Assess: MEWS Score  MEWS Temp 0  MEWS Systolic 0  MEWS Pulse 2  MEWS RR 0  MEWS LOC 0  MEWS Score 2  MEWS Score Color Yellow  Assess: if the MEWS score is Yellow or Red  Were vital signs taken at a resting state? Yes  Focused Assessment No change from prior assessment  Early Detection of Sepsis Score *See Row Information* Low  MEWS guidelines implemented *See Row Information* Yes  Treat  Pain Scale 0-10  Pain Score 0  Breathing 0  Negative Vocalization 0  Facial Expression 0  Body Language 0  Consolability 0  PAINAD Score 0  Take Vital Signs  Increase Vital Sign Frequency  Yellow: Q 2hr X 2 then Q 4hr X 2, if remains yellow, continue Q 4hrs  Notify: Charge Nurse/RN  Name of Charge Nurse/RN Notified Minerva Areola  Date Charge Nurse/RN Notified 05/15/21  Time Charge Nurse/RN Notified 2025  Notify: Provider  Provider Name/Title Bl[facility]  Date Provider Notified 05/15/21  Time Provider Notified 2034  Notification Type Page  Notification Reason Other (Comment) (HR [id number])  Provider response See new orders (metoprolol 25 mg PO)  Date of Provider Response 05/15/21  Time of Provider Response 2039
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
## 2021-05-15 NOTE — Progress Notes (Signed)
05/15/2021  RN spoke to [employer] (PA-C) concerning patient did not have a IV, because the was pulling the IV out. The IV team was called, because RN on the unit was unable to placed IV to give patient the antibiotic. IV team tried and was unsuccessful, the [name] was made aware. Lovie Macadamia RN

## 2021-05-15 NOTE — Op Note (Signed)
Jerome [name], STROHMEIER MEDICAL RECORD NO: [id number] ACCOUNT NO: [id number] DATE OF BIRTH: [birth year] FACILITY: [facility] LOCATION: [facility] PHYSICIAN: [name], MD  Operative Report   PREOPERATIVE DIAGNOSES:   1.  Right tibia nonunion. 2.  Retained antibiotic spacer, right tibia.  POSTOPERATIVE DIAGNOSES:   1.  Right tibia nonunion. 2.  Retained antibiotic spacer, right tibia.  PROCEDURE PERFORMED:  1.  Removal of antibiotic spacer, right tibia. 2.  Repair of left tibia nonunion using masqulet technique with Infuse and allografting with cancellous chips.  SURGEON:  Myrene Galas, MD  ASSISTANT:  [name], PA-C  ANESTHESIA:  General.  COMPLICATIONS:  None.  TOURNIQUET:  None.  DISPOSITION:  To PACU.  CONDITION:  Stable.  INDICATIONS FOR PROCEDURE:  The patient sustained an open right tibia fracture treated with staged reconstruction, now returning for staged treatment o the nonunion with cement spacer removal. Risks include infection, nerve injury, vessel injury, DVT, PE, loss of motion, arthritis, and need for further surgery, among others.  Consent was provided to proceed.  SUMMARY OF PROCEDURE:  The patient was taken to the operating room where general anesthesia was induced.  His right lower extremity were prepped and draped in the usual sterile fashion.  No tourniquet was used during the procedure.   We began at the tibia where using a C-arm for confirmation, a limited approach was made through the medial incision.  I was able to dissect carefully down to the membrane overlying the antibiotic spacer and it was meticulously protected.  I  then incised it longitudinally, carefully shelling out the antibiotic spacer while protecting the membrane.  It was irrigated in and around the soft tissues, but again no disruption of the membrane whatsoever.I then placed large sponge and 60 cc of cancellous graft into the cavity using  running PDS suture to repair the membrane over top.  We  had excellent fit and fill and final images confirmed this on AP and lateral views with C-arm.  The proximal wound at the graft harvest site was irrigated thoroughly, closed in standard layered fashion.  Sterile gently compressive dressings were applied.  No splint was used for the leg, given his prior procedures, but rather compressive dressing and Cam boot.  [name], PA-C, was present and assisting during the  procedure.  PROGNOSIS:  The patient will be nonweightbearing for the next few weeks and then likely progressive gentle weightbearing depending upon his clinical and radiographic progress.  He remains at elevated risk for recurrence of infection and persistent  nonunion  given his multiple prior procedures and open injury as well as mental status which makes compliance questionable.   [facility] D: 12/21/2020 10:09:53 am
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
## 2021-05-16 NOTE — Anesthesia Postprocedure Evaluation (Signed)
Anesthesia Post Note  Patient: Jerome [name]  Procedure(s) Performed: Repair of  Right Tibial Nonunion, Removal of Antibiotic Spacer (Right: Leg Lower)     Patient location during evaluation: PACU Anesthesia Type: General Level of consciousness: sedated Pain management: pain level controlled Vital Signs Assessment: post-procedure vital signs reviewed and stable Respiratory status: spontaneous breathing Cardiovascular status: stable Anesthetic complications: no   No notable events documented.  Last Vitals:  Vitals:   05/16/21 1728 05/16/21 2023  BP: 123/78 131/79  Pulse: (!) 105 (!) 107  Resp: 20 18  Temp: (!) 36.2 C 37.2 C  SpO2: 99% 100%    Last Pain:  Vitals:   05/16/21 1728  TempSrc: Oral  PainSc:                  Lewie Loron
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
## 2021-05-17 ENCOUNTER — Encounter (HOSPITAL_COMMUNITY): Payer: Self-pay | Admitting: Orthopedic Surgery
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
## 2021-05-17 NOTE — TOC Progression Note (Signed)
Transition of Care [facility]) - Progression Note    Patient Details  Name: Jerome [name] MRN: [id number] Date of Birth: Feb 12, 1961  [facility] CM/SW Contact  Janae Bridgeman, RN Phone Number: 05/17/2021, 2:49 PM  Clinical Narrative:    CM and MSW with DTP Team continue to follow the patient for TOC needs.  The patient has pending interim guardianship court date scheduled for 06/10/2021 with DSS.  The patient continues to have aggressive behaviors including yelling profanities, sexual comments towards staff and belligerent behaviors.  The patient continues to require safety measures including soft posey bed at this time.   Expected Discharge Plan:  (To be determined.) Barriers to Discharge: Continued Medical Work up  Expected Discharge Plan and Services Expected Discharge Plan:  (To be determined.)   Discharge Planning Services: CM Consult   Living arrangements for the past 2 months: Homeless                                       Social Determinants of Health (SDOH) Interventions    Readmission Risk Interventions No flowsheet data found.
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
## 2021-05-18 NOTE — TOC Progression Note (Signed)
Transition of Care [facility]) - Progression Note    Patient Details  Name: Cali Cuartas MRN: [id number] Date of Birth: 01/15/1961  [facility] CM/SW Contact  Janae Bridgeman, RN Phone Number: 05/18/2021, 3:00 PM  Clinical Narrative:    Case management spoke with Grandville Silos, MSW Supervisor with [facility] for assistance with patient's placement and a skilled nursing facility for care - recommendations were made to explore possible admission opportunity at [facility] in [place], [place].  DTP Team will explore options for placement at the facility.  The patient at this time continues to have negative behaviors that make it unlikely to find placement at skilled nursing facility that are unable to provide safe care to the patient.  Patient has scheduled guardianship court date scheduled with DSS for 06/10/2021.  CM and MSW on DTP Team will continue to explore options for placement.   Expected Discharge Plan:  (To be determined.) Barriers to Discharge: Continued Medical Work up  Expected Discharge Plan and Services Expected Discharge Plan:  (To be determined.)   Discharge Planning Services: CM Consult   Living arrangements for the past 2 months: Homeless                                       Social Determinants of Health (SDOH) Interventions    Readmission Risk Interventions No flowsheet data found.

## 2021-05-18 NOTE — Plan of Care (Signed)
Pt continues to be verbally disruptive and abusive to staff. Pt's restraint orders were reinstated and pt remains in POSEY bed at this time. Pt allowed RN to assess and dress his incision sites, as well as complete an overall assessment when passing meds this AM. Pt does not have any complaints at this time.    Problem: Safety: Goal: Non-violent Restraint(s) Outcome: Not Progressing   Problem: Education: Goal: Knowledge of General Education information will improve Description: Including pain rating scale, medication(s)/side effects and non-pharmacologic comfort measures Outcome: Not Progressing   Problem: Health Behavior/Discharge Planning: Goal: Ability to manage health-related needs will improve Outcome: Not Progressing   Problem: Clinical Measurements: Goal: Ability to maintain clinical measurements within normal limits will improve Outcome: Not Progressing Goal: Will remain free from infection Outcome: Not Progressing Goal: Diagnostic test results will improve Outcome: Not Progressing Goal: Respiratory complications will improve Outcome: Not Progressing Goal: Cardiovascular complication will be avoided Outcome: Not Progressing   Problem: Activity: Goal: Risk for activity intolerance will decrease Outcome: Not Progressing   Problem: Nutrition: Goal: Adequate nutrition will be maintained Outcome: Not Progressing   Problem: Coping: Goal: Level of anxiety will decrease Outcome: Not Progressing   Problem: Elimination: Goal: Will not experience complications related to bowel motility Outcome: Not Progressing Goal: Will not experience complications related to urinary retention Outcome: Not Progressing   Problem: Pain Managment: Goal: General experience of comfort will improve Outcome: Not Progressing   Problem: Safety: Goal: Ability to remain free from injury will improve Outcome: Not Progressing   Problem: Skin Integrity: Goal: Risk for impaired skin integrity  will decrease Outcome: Not Progressing   Problem: Education: Goal: Required Educational Video(s) Outcome: Not Progressing   Problem: Clinical Measurements: Goal: Ability to maintain clinical measurements within normal limits will improve Outcome: Not Progressing Goal: Postoperative complications will be avoided or minimized Outcome: Not Progressing   Problem: Safety: Goal: Violent Restraint(s) Outcome: Not Progressing

## 2021-05-19 MED ORDER — DIVALPROEX SODIUM 250 MG PO DR TAB
250.0000 mg | DELAYED_RELEASE_TABLET | Freq: Every day | ORAL | Status: DC
  Administered 2021-05-20 – 2021-05-28 (×9): 250 mg via ORAL
  Filled 2021-05-19 (×9): qty 1

## 2021-05-19 MED ORDER — OLANZAPINE 5 MG PO TBDP
10.0000 mg | ORAL_TABLET | Freq: Every day | ORAL | Status: DC
  Administered 2021-05-19 – 2021-07-29 (×72): 10 mg via ORAL
  Filled 2021-05-19 (×74): qty 2

## 2021-05-19 MED ORDER — DIVALPROEX SODIUM 500 MG PO DR TAB
500.0000 mg | DELAYED_RELEASE_TABLET | Freq: Every day | ORAL | Status: DC
  Administered 2021-05-19 – 2021-05-23 (×5): 500 mg via ORAL
  Filled 2021-05-19 (×6): qty 1
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
## 2021-05-19 NOTE — Progress Notes (Signed)
1636 - Pt was calm and corporative with staff when I went in to assess him and see if he needed to use the rest room. Pt was found sitting in the nude with his arm holding his folded legs to his chest. Pt stated that he was cold. Room temperature was adjusted and pt was offered/given 2 cups of coffee, two warm blankets and two robes for comfort. Pt was offered the opportunity to toilet at this time and we used the front wheel walker to transfer to the bsc. Pt was unable to void at this time. Pt went back to bed and sat to the side drinking his coffee as he listen to some music and we chatted for a while. Pt start to become verbally aggressive again during our conversation, but was easily redirected when I told him that he would have to return to be and I would leave the room if he continues to be verbally abusive. Pt redirected his conversation topic to things that he likes to do when he is not in the hospital. He also verbalized that he screams and acts out because he hates being enclosed and having no one to talk to. Pt was returned to bed at 1734 when RN had to leave the room and administer medication to other pts in group. Pt was compliant and was left to listen to some music. Pt was welcoming when RN returned to hand pt his meal.   Pt also ambulated to the bathroom to void and had a medium sized BM at 1650.

## 2021-05-19 NOTE — Plan of Care (Signed)

## 2021-05-19 NOTE — Plan of Care (Signed)
  Problem: Safety: Goal: Non-violent Restraint(s) Outcome: Not Progressing   Problem: Education: Goal: Knowledge of General Education information will improve Description: Including pain rating scale, medication(s)/side effects and non-pharmacologic comfort measures Outcome: Not Progressing   Problem: Health Behavior/Discharge Planning: Goal: Ability to manage health-related needs will improve Outcome: Not Progressing   Problem: Clinical Measurements: Goal: Ability to maintain clinical measurements within normal limits will improve Outcome: Not Progressing Goal: Will remain free from infection Outcome: Not Progressing Goal: Diagnostic test results will improve Outcome: Not Progressing Goal: Respiratory complications will improve Outcome: Not Progressing Goal: Cardiovascular complication will be avoided Outcome: Not Progressing   Problem: Activity: Goal: Risk for activity intolerance will decrease Outcome: Not Progressing   Problem: Nutrition: Goal: Adequate nutrition will be maintained Outcome: Not Progressing   Problem: Coping: Goal: Level of anxiety will decrease Outcome: Not Progressing   Problem: Elimination: Goal: Will not experience complications related to bowel motility Outcome: Not Progressing Goal: Will not experience complications related to urinary retention Outcome: Not Progressing
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
## 2021-05-20 LAB — VALPROIC ACID LEVEL: Valproic Acid Lvl: 12 ug/mL — ABNORMAL LOW (ref 50.0–100.0)

## 2021-05-20 NOTE — Plan of Care (Signed)

## 2021-05-20 NOTE — Progress Notes (Signed)
Pt calm and cooperative. Took night time meds. Given gram crackers and chocolate ice cream for an snack. Offered toileting , pt refused.
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
## 2021-05-21 NOTE — Progress Notes (Signed)
Mobility Specialist Progress Note   05/21/21 1045  Mobility  Activity Transferred:  Bed to chair  Level of Assistance Minimal assist, patient does 75% or more (+2)  Assistive Device Front wheel walker  RUE Weight Bearing WBAT  RLE Weight Bearing WBAT  LLE Weight Bearing WBAT  Distance Ambulated (ft) 2 ft  Mobility Sit up in bed/chair position for meals  Mobility Response Tolerated fair  Mobility performed by Mobility specialist;Nurse  Bed Position Chair  $Mobility charge 1 Mobility   RN requested assistance to get pt in chair. No complaints throughout transfer.  Frederico Hamman Mobility Specialist Phone Number [phone]
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
## 2021-05-22 NOTE — Progress Notes (Signed)
Patient very agitated today. Out of bed to chair with chair belt in attempts to decrease agitation. Patient continues to loudly verbalize displeasure and berate staff. Attempts at redirection unsuccessful so far.

## 2021-05-23 LAB — COMPREHENSIVE METABOLIC PANEL
ALT: 35 U/L (ref 0–44)
AST: 34 U/L (ref 15–41)
Albumin: 3 g/dL — ABNORMAL LOW (ref 3.5–5.0)
Alkaline Phosphatase: 132 U/L — ABNORMAL HIGH (ref 38–126)
Anion gap: 8 (ref 5–15)
BUN: 8 mg/dL (ref 6–20)
CO2: 28 mmol/L (ref 22–32)
Calcium: 9.4 mg/dL (ref 8.9–10.3)
Chloride: 104 mmol/L (ref 98–111)
Creatinine, Ser: 0.85 mg/dL (ref 0.61–1.24)
GFR, Estimated: >60 mL/min (ref >60)
Glucose, Bld: 170 mg/dL — ABNORMAL HIGH (ref 70–99)
Potassium: 4.2 mmol/L (ref 3.5–5.1)
Sodium: 140 mmol/L (ref 135–145)
Total Bilirubin: 0.5 mg/dL (ref 0.3–1.2)
Total Protein: 7.4 g/dL (ref 6.5–8.1)

## 2021-05-23 LAB — CBC
HCT: 40.1 % (ref 39.0–52.0)
Hemoglobin: 13.4 g/dL (ref 13.0–17.0)
MCH: 30.5 pg (ref 26.0–34.0)
MCHC: 33.4 g/dL (ref 30.0–36.0)
MCV: 91.1 fL (ref 80.0–100.0)
Platelets: 278 10*3/uL (ref 150–400)
RBC: 4.4 MIL/uL (ref 4.22–5.81)
RDW: 13.8 % (ref 11.5–15.5)
WBC: 7.4 10*3/uL (ref 4.0–10.5)
nRBC: 0 % (ref 0.0–0.2)

## 2021-05-23 LAB — VALPROIC ACID LEVEL: Valproic Acid Lvl: 29 ug/mL — ABNORMAL LOW (ref 50.0–100.0)
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
## 2021-05-24 MED ORDER — DIVALPROEX SODIUM 500 MG PO DR TAB
750.0000 mg | DELAYED_RELEASE_TABLET | Freq: Every day | ORAL | Status: AC
  Administered 2021-05-24 – 2021-05-28 (×5): 750 mg via ORAL
  Filled 2021-05-24 (×6): qty 1

## 2021-05-24 NOTE — Plan of Care (Signed)
  Problem: Safety: Goal: Non-violent Restraint(s) Outcome: Not Progressing   Problem: Education: Goal: Knowledge of General Education information will improve Description: Including pain rating scale, medication(s)/side effects and non-pharmacologic comfort measures Outcome: Not Progressing   Problem: Health Behavior/Discharge Planning: Goal: Ability to manage health-related needs will improve Outcome: Not Progressing   Problem: Clinical Measurements: Goal: Ability to maintain clinical measurements within normal limits will improve Outcome: Not Progressing Goal: Will remain free from infection Outcome: Not Progressing Goal: Diagnostic test results will improve Outcome: Not Progressing Goal: Respiratory complications will improve Outcome: Not Progressing Goal: Cardiovascular complication will be avoided Outcome: Not Progressing   Problem: Activity: Goal: Risk for activity intolerance will decrease Outcome: Not Progressing   Problem: Nutrition: Goal: Adequate nutrition will be maintained Outcome: Not Progressing   Problem: Coping: Goal: Level of anxiety will decrease Outcome: Not Progressing   Problem: Elimination: Goal: Will not experience complications related to bowel motility Outcome: Not Progressing Goal: Will not experience complications related to urinary retention Outcome: Not Progressing   Problem: Pain Managment: Goal: General experience of comfort will improve Outcome: Not Progressing   Problem: Safety: Goal: Ability to remain free from injury will improve Outcome: Not Progressing   Problem: Skin Integrity: Goal: Risk for impaired skin integrity will decrease Outcome: Not Progressing   Problem: Education: Goal: Required Educational Video(s) Outcome: Not Progressing   Problem: Clinical Measurements: Goal: Ability to maintain clinical measurements within normal limits will improve Outcome: Not Progressing Goal: Postoperative complications will be  avoided or minimized Outcome: Not Progressing   Problem: Safety: Goal: Violent Restraint(s) Outcome: Not Progressing
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
## 2021-05-24 NOTE — Consult Note (Signed)
Brief Psychiatry Consult Note  The patient was last seen by the psychiatry service on 05/21/21. Interim documentation by primary team and nursing staff has been reviewed - pt had a difficult weekend (primarily Saturday) but overall remains with significantly improved behavior after transition back to Olanzapine. Weekend depakote level (although not drawn at trough) remained subtherapeutic (even taking into account mild decrease in albumin). Unfortunately today pt taking nap and not amenable to interview.   Please see prior full consult notes for full diagnostic formulation.   - inc depakote to 250/750, will continue to follow - no other med changes at this time.    I personally spent 15 minutes on the unit in direct patient care. The direct patient care time included face-to-face time with the patient, reviewing the patient's chart, communicating with other professionals, and coordinating care. Greater than 50% of this time was spent in counseling or coordinating care with the patient regarding goals of hospitalization, psycho-education, and discharge planning needs.   Pritesh Sobecki A Cabrina Shiroma

## 2021-05-25 MED ORDER — FUROSEMIDE 10 MG/ML IJ SOLN
INTRAMUSCULAR | Status: AC
  Filled 2021-05-25: qty 4

## 2021-05-25 NOTE — Plan of Care (Signed)
  Problem: Safety: Goal: Non-violent Restraint(s) Outcome: Not Progressing   Problem: Education: Goal: Knowledge of General Education information will improve Description: Including pain rating scale, medication(s)/side effects and non-pharmacologic comfort measures Outcome: Not Progressing   Problem: Health Behavior/Discharge Planning: Goal: Ability to manage health-related needs will improve Outcome: Not Progressing   Problem: Clinical Measurements: Goal: Ability to maintain clinical measurements within normal limits will improve Outcome: Not Progressing Goal: Will remain free from infection Outcome: Not Progressing Goal: Diagnostic test results will improve Outcome: Not Progressing Goal: Respiratory complications will improve Outcome: Not Progressing Goal: Cardiovascular complication will be avoided Outcome: Not Progressing   Problem: Activity: Goal: Risk for activity intolerance will decrease Outcome: Not Progressing   Problem: Nutrition: Goal: Adequate nutrition will be maintained Outcome: Not Progressing   Problem: Coping: Goal: Level of anxiety will decrease Outcome: Not Progressing   Problem: Elimination: Goal: Will not experience complications related to bowel motility Outcome: Not Progressing Goal: Will not experience complications related to urinary retention Outcome: Not Progressing   Problem: Pain Managment: Goal: General experience of comfort will improve Outcome: Not Progressing   Problem: Safety: Goal: Ability to remain free from injury will improve Outcome: Not Progressing   Problem: Skin Integrity: Goal: Risk for impaired skin integrity will decrease Outcome: Not Progressing   Problem: Education: Goal: Required Educational Video(s) Outcome: Not Progressing   Problem: Clinical Measurements: Goal: Ability to maintain clinical measurements within normal limits will improve Outcome: Not Progressing Goal: Postoperative complications will be  avoided or minimized Outcome: Not Progressing   Problem: Safety: Goal: Violent Restraint(s) Outcome: Not Progressing

## 2021-05-25 NOTE — Plan of Care (Signed)

## 2021-05-25 NOTE — Progress Notes (Signed)
Pt in chair shaving.
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
## 2021-05-26 NOTE — Plan of Care (Signed)
  Problem: Clinical Measurements: Goal: Ability to maintain clinical measurements within normal limits will improve Outcome: Progressing Goal: Diagnostic test results will improve Outcome: Progressing   

## 2021-05-26 NOTE — Progress Notes (Signed)
sutured still in right upper arm. Clarified with NP that sutures in arm needed to be removed. NP said ok to remove sutured. Sutures removed, pt tolerated well.
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
## 2021-05-26 NOTE — Progress Notes (Addendum)
12:30pm: CSW met with patient's GAL Richardson Landry and patient at bedside for discussion. Patient engaged in conversation but quickly became frustrated with the questioning. Patient's court date is 12/15 at 2pm.  10am: CSW spoke with patient's guardian ad litem Hessie Knows [phone]) who states he will come visit the patient today at 12:30pm.  Madilyn Fireman, MSW, LCSW Transitions of Care  Clinical Social Worker II [phone]
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
## 2021-05-26 NOTE — Progress Notes (Signed)
Removed 7 sutures from patent right shoulder per order. Patient tolerated well.
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
## 2021-05-27 NOTE — Plan of Care (Signed)
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
## 2021-05-27 NOTE — Progress Notes (Signed)
CSW spoke with Dr. Gasper Sells regarding the possibility of a referral being made to a [facility] facility for patient. MD to consider impacts of referral prior to providing CSW with an answer.  CSW confirmed with unit secretary that patient is now in a regular bed - the vail bed was removed from his room.  Edwin Dada, MSW, LCSW Transitions of Care  Clinical Social Worker II [phone]

## 2021-05-28 LAB — VALPROIC ACID LEVEL: Valproic Acid Lvl: 44 ug/mL — ABNORMAL LOW (ref 50.0–100.0)

## 2021-05-28 MED ORDER — DIVALPROEX SODIUM ER 250 MG PO TB24: 250.0000 mg | ORAL_TABLET | Freq: Every day | ORAL | Status: DC

## 2021-05-28 MED ORDER — DIVALPROEX SODIUM ER 500 MG PO TB24
1250.0000 mg | ORAL_TABLET | Freq: Every day | ORAL | Status: DC
  Administered 2021-05-29 – 2021-06-03 (×6): 1250 mg via ORAL
  Filled 2021-05-28 (×6): qty 1

## 2021-05-28 MED ORDER — NICOTINE 7 MG/24HR TD PT24
7.0000 mg | MEDICATED_PATCH | Freq: Every day | TRANSDERMAL | Status: DC
  Administered 2021-05-28 – 2021-07-04 (×25): 7 mg via TRANSDERMAL
  Filled 2021-05-28 (×37): qty 1
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
## 2021-05-28 NOTE — Progress Notes (Signed)
Mobility Specialist Criteria Algorithm Info.   05/28/21 1023  Mobility  Activity Ambulated in room (in chair before and after)  Range of Motion/Exercises Active;All extremities  Level of Assistance Minimal assist, patient does 75% or more  Assistive Device Front wheel walker  RUE Weight Bearing WBAT  RLE Weight Bearing NWB  LLE Weight Bearing WBAT  Distance Ambulated (ft) 20 ft  Mobility Ambulated with assistance in room  Mobility Response Tolerated well  Mobility performed by Mobility specialist  Bed Position Chair    Patient received standing up in front of chair with chair alarm active, stating he's trying to clean his house. Ambulated short distance in room requiring cues to maintain NWB on RLE. Returned to recliner chair without incident or complaint. Was left with all needs met, call bell in reach and chair alarm active.   05/28/2021 3:50 PM
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
## 2021-05-29 LAB — CBC WITH DIFFERENTIAL/PLATELET
Abs Immature Granulocytes: 0.05 10*3/uL (ref 0.00–0.07)
Basophils Absolute: 0 10*3/uL (ref 0.0–0.1)
Basophils Relative: 0 %
Eosinophils Absolute: 0.2 10*3/uL (ref 0.0–0.5)
Eosinophils Relative: 2 %
HCT: 41.3 % (ref 39.0–52.0)
Hemoglobin: 14.1 g/dL (ref 13.0–17.0)
Immature Granulocytes: 1 %
Lymphocytes Relative: 35 %
Lymphs Abs: 3.1 10*3/uL (ref 0.7–4.0)
MCH: 30.1 pg (ref 26.0–34.0)
MCHC: 34.1 g/dL (ref 30.0–36.0)
MCV: 88.2 fL (ref 80.0–100.0)
Monocytes Absolute: 0.8 10*3/uL (ref 0.1–1.0)
Monocytes Relative: 9 %
Neutro Abs: 4.7 10*3/uL (ref 1.7–7.7)
Neutrophils Relative %: 53 %
Platelets: 195 10*3/uL (ref 150–400)
RBC: 4.68 MIL/uL (ref 4.22–5.81)
RDW: 13.9 % (ref 11.5–15.5)
WBC: 8.8 10*3/uL (ref 4.0–10.5)
nRBC: 0 % (ref 0.0–0.2)

## 2021-05-29 LAB — COMPREHENSIVE METABOLIC PANEL
ALT: 34 U/L (ref 0–44)
AST: 30 U/L (ref 15–41)
Albumin: 3.5 g/dL (ref 3.5–5.0)
Alkaline Phosphatase: 157 U/L — ABNORMAL HIGH (ref 38–126)
Anion gap: 11 (ref 5–15)
BUN: 13 mg/dL (ref 6–20)
CO2: 27 mmol/L (ref 22–32)
Calcium: 10 mg/dL (ref 8.9–10.3)
Chloride: 97 mmol/L — ABNORMAL LOW (ref 98–111)
Creatinine, Ser: 0.88 mg/dL (ref 0.61–1.24)
GFR, Estimated: >60 mL/min (ref >60)
Glucose, Bld: 223 mg/dL — ABNORMAL HIGH (ref 70–99)
Potassium: 4.1 mmol/L (ref 3.5–5.1)
Sodium: 135 mmol/L (ref 135–145)
Total Bilirubin: 0.5 mg/dL (ref 0.3–1.2)
Total Protein: 8.4 g/dL — ABNORMAL HIGH (ref 6.5–8.1)

## 2021-05-29 NOTE — Plan of Care (Signed)
  Problem: Safety: Goal: Non-violent Restraint(s) Outcome: Not Progressing   Problem: Education: Goal: Knowledge of General Education information will improve Description: Including pain rating scale, medication(s)/side effects and non-pharmacologic comfort measures Outcome: Not Progressing   Problem: Health Behavior/Discharge Planning: Goal: Ability to manage health-related needs will improve Outcome: Not Progressing   Problem: Clinical Measurements: Goal: Ability to maintain clinical measurements within normal limits will improve Outcome: Not Progressing Goal: Will remain free from infection Outcome: Not Progressing Goal: Diagnostic test results will improve Outcome: Not Progressing Goal: Respiratory complications will improve Outcome: Not Progressing Goal: Cardiovascular complication will be avoided Outcome: Not Progressing   Problem: Activity: Goal: Risk for activity intolerance will decrease Outcome: Not Progressing   Problem: Nutrition: Goal: Adequate nutrition will be maintained Outcome: Not Progressing   Problem: Coping: Goal: Level of anxiety will decrease Outcome: Not Progressing   Problem: Elimination: Goal: Will not experience complications related to bowel motility Outcome: Not Progressing Goal: Will not experience complications related to urinary retention Outcome: Not Progressing   Problem: Pain Managment: Goal: General experience of comfort will improve Outcome: Not Progressing   Problem: Safety: Goal: Ability to remain free from injury will improve Outcome: Not Progressing   Problem: Education: Goal: Required Educational Video(s) Outcome: Not Progressing   Problem: Clinical Measurements: Goal: Ability to maintain clinical measurements within normal limits will improve Outcome: Not Progressing Goal: Postoperative complications will be avoided or minimized Outcome: Not Progressing   Problem: Safety: Goal: Violent Restraint(s) Outcome: Not  Progressing
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
## 2021-05-30 NOTE — Plan of Care (Signed)
  Problem: Safety: Goal: Non-violent Restraint(s) Outcome: Not Progressing   Problem: Education: Goal: Knowledge of General Education information will improve Description: Including pain rating scale, medication(s)/side effects and non-pharmacologic comfort measures Outcome: Not Progressing   Problem: Health Behavior/Discharge Planning: Goal: Ability to manage health-related needs will improve Outcome: Not Progressing   Problem: Activity: Goal: Risk for activity intolerance will decrease Outcome: Not Progressing   Problem: Nutrition: Goal: Adequate nutrition will be maintained Outcome: Not Progressing   Problem: Elimination: Goal: Will not experience complications related to bowel motility Outcome: Not Progressing Goal: Will not experience complications related to urinary retention Outcome: Not Progressing   Problem: Pain Managment: Goal: General experience of comfort will improve Outcome: Not Progressing   Problem: Safety: Goal: Ability to remain free from injury will improve Outcome: Not Progressing   Problem: Skin Integrity: Goal: Risk for impaired skin integrity will decrease Outcome: Not Progressing
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
## 2021-05-31 MED ORDER — DICLOFENAC SODIUM 1 % EX GEL
4.0000 g | Freq: Four times a day (QID) | CUTANEOUS | Status: DC
  Administered 2021-05-31 – 2021-07-30 (×184): 4 g via TOPICAL
  Filled 2021-05-31 (×6): qty 100
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
## 2021-05-31 NOTE — Plan of Care (Signed)
  Problem: Safety: Goal: Non-violent Restraint(s) Outcome: Progressing   Problem: Education: Goal: Knowledge of General Education information will improve Description: Including pain rating scale, medication(s)/side effects and non-pharmacologic comfort measures Outcome: Progressing   Problem: Health Behavior/Discharge Planning: Goal: Ability to manage health-related needs will improve Outcome: Progressing   

## 2021-05-31 NOTE — Plan of Care (Signed)
  Problem: Safety: Goal: Non-violent Restraint(s) Outcome: Progressing   Problem: Education: Goal: Knowledge of General Education information will improve Description: Including pain rating scale, medication(s)/side effects and non-pharmacologic comfort measures Outcome: Progressing   Problem: Pain Managment: Goal: General experience of comfort will improve Outcome: Progressing   Problem: Safety: Goal: Ability to remain free from injury will improve Outcome: Progressing   Problem: Skin Integrity: Goal: Risk for impaired skin integrity will decrease Outcome: Progressing

## 2021-05-31 NOTE — TOC Progression Note (Signed)
Transition of Care [facility]) - Progression Note    Patient Details  Name: Jerome [name] MRN: [id number] Date of Birth: 01-22-1961  Transition of Care [facility] CM/SW Contact  Janae Bridgeman, RN Phone Number: 05/31/2021, 3:01 PM  Clinical Narrative:    CM and MSW with DTP Team continue to follow the patient for TOC needs.  The patient is currently awaiting guardianship hearing and need for Medicaid for Neuro-Medical SNF placement - likely need for [facility] facility in [place], [place] but will need establishment of guardianship; and Medicaid for bed availability.  Barriers to SNF placement include guardianship, Medicaid approval, history or aggressive behaviors, and ability mobilize.  Expected Discharge Plan:  (To be determined.) Barriers to Discharge: Continued Medical Work up  Expected Discharge Plan and Services Expected Discharge Plan:  (To be determined.)   Discharge Planning Services: CM Consult   Living arrangements for the past 2 months: Homeless                                       Social Determinants of Health (SDOH) Interventions    Readmission Risk Interventions No flowsheet data found.

## 2021-06-01 ENCOUNTER — Other Ambulatory Visit: Payer: Self-pay

## 2021-06-01 MED ORDER — IBUPROFEN 200 MG PO TABS
400.0000 mg | ORAL_TABLET | Freq: Four times a day (QID) | ORAL | Status: DC | PRN
  Administered 2021-06-01 – 2021-07-25 (×5): 400 mg via ORAL
  Filled 2021-06-01 (×5): qty 2

## 2021-06-01 NOTE — Progress Notes (Signed)
SLP Cancellation Note  Patient Details Name: Berwyn Bigley MRN: [id number] DOB: Jun 06, 1961   Cancelled treatment:       Reason Eval/Treat Not Completed: Patient unavailable x2; the first, he was sleeping and I was asked politely by nursing not to disturb him if possible; the next, he was having a bowel movement unclothed and sitting on the shower chair spraying himself with water.     [name] 06/01/2021, 2:57 PM
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
## 2021-06-01 NOTE — Plan of Care (Signed)
  Problem: Safety: Goal: Non-violent Restraint(s) Outcome: Adequate for Discharge   Problem: Education: Goal: Knowledge of General Education information will improve Description: Including pain rating scale, medication(s)/side effects and non-pharmacologic comfort measures Outcome: Adequate for Discharge   Problem: Health Behavior/Discharge Planning: Goal: Ability to manage health-related needs will improve Outcome: Adequate for Discharge

## 2021-06-02 ENCOUNTER — Inpatient Hospital Stay (HOSPITAL_COMMUNITY): Payer: No Typology Code available for payment source

## 2021-06-02 DIAGNOSIS — M79604 Pain in right leg: Secondary | ICD-10-CM

## 2021-06-02 NOTE — Progress Notes (Signed)
Lower extremity venous RT study completed.   Please see CV Proc for preliminary results.   Gwyn Hieronymus, RDMS, RVT  

## 2021-06-02 NOTE — Progress Notes (Signed)
CSW attempted to reach Jerome [name] at [facility] without success - a voicemail was left requesting a return call.  Edwin Dada, MSW, LCSW Transitions of Care  Clinical Social Worker II [phone]
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
## 2021-06-02 NOTE — Progress Notes (Signed)
Mobility Specialist Criteria Algorithm Info.   06/02/21 1300  Mobility  Activity Ambulated in room;Ambulated to bathroom;Dangled on edge of bed  Range of Motion/Exercises Active;All extremities  Level of Assistance Minimal assist, patient does 75% or more  Assistive Device Front wheel walker  RUE Weight Bearing WBAT  RLE Weight Bearing WBAT  LLE Weight Bearing WBAT  Distance Ambulated (ft) 35 ft  Mobility Ambulated with assistance in room  Mobility Response Tolerated well  Mobility performed by Mobility specialist  Bed Position Chair   Patient received in recliner chair pleasant at the moment and eager to participate in mobility. Stood min guard and ambulated in room. Unable to maintain NWB even with max cues to do so. Required seated rest break x1 secondary to having a headache per pt. Returned to recliner chair without incident. Was left with all needs met, call bell in reach and chair alarm set.  06/02/2021 3:08 PM  

## 2021-06-02 NOTE — Progress Notes (Signed)
5 sutures removed from right lower leg. Patient tolerated procedure well.

## 2021-06-02 NOTE — Plan of Care (Signed)
  Problem: Safety: Goal: Non-violent Restraint(s) Outcome: Progressing   Problem: Education: Goal: Knowledge of General Education information will improve Description: Including pain rating scale, medication(s)/side effects and non-pharmacologic comfort measures Outcome: Progressing   Problem: Health Behavior/Discharge Planning: Goal: Ability to manage health-related needs will improve Outcome: Progressing   

## 2021-06-03 LAB — VALPROIC ACID LEVEL: Valproic Acid Lvl: 49 ug/mL — ABNORMAL LOW (ref 50.0–100.0)

## 2021-06-03 LAB — HEPATIC FUNCTION PANEL
ALT: 29 U/L (ref 0–44)
AST: 21 U/L (ref 15–41)
Albumin: 3.1 g/dL — ABNORMAL LOW (ref 3.5–5.0)
Alkaline Phosphatase: 194 U/L — ABNORMAL HIGH (ref 38–126)
Bilirubin, Direct: 0.2 mg/dL (ref 0.0–0.2)
Indirect Bilirubin: 0.3 mg/dL (ref 0.3–0.9)
Total Bilirubin: 0.5 mg/dL (ref 0.3–1.2)
Total Protein: 7.7 g/dL (ref 6.5–8.1)

## 2021-06-03 NOTE — Progress Notes (Signed)
Patient refused medication at scheduled time, meds given at 1150. He ate and returned to bed.
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
## 2021-06-03 NOTE — Progress Notes (Signed)
  Mobility Specialist Criteria Algorithm Info.   06/03/21 1015  Mobility  Activity Ambulated in room  Range of Motion/Exercises Active;All extremities  Level of Assistance Contact guard assist, steadying assist  Assistive Device Front wheel walker  RUE Weight Bearing WBAT  RLE Weight Bearing WBAT  LLE Weight Bearing WBAT  Distance Ambulated (ft) 30 ft  Mobility Ambulated with assistance in room  Mobility Response Tolerated well  Mobility performed by Mobility specialist  Bed Position Semi-fowlers   Patient received dangling EOB rambling to himself, eager to participate in mobility when asked. Attempted to stand quickly without RW requiring cues for hand placement and safety. Pt stood with supervision and ambulated in room at min guard with steady gait. Returned to EOB without complaint or incident. Was left dangling EOB with all needs met, call bell in reach, alarm set.   06/03/2021 10:31 AM

## 2021-06-03 NOTE — Progress Notes (Signed)
Seen this am Has no complaints Resting, calm, comfortable Discussed w/ NP cont with current plan of care- refer to Np notes for more details. Pending placement, court hearing 12/15

## 2021-06-03 NOTE — Progress Notes (Signed)
CSW spoke with Maxie Better at DSS to discuss upcoming court hearing for patient. Court hearing is scheduled for 12/15 at 2pm.  Edwin Dada, MSW, LCSW Transitions of Care  Clinical Social Worker II [phone]

## 2021-06-04 ENCOUNTER — Inpatient Hospital Stay (HOSPITAL_COMMUNITY): Payer: No Typology Code available for payment source

## 2021-06-04 DIAGNOSIS — E119 Type 2 diabetes mellitus without complications: Secondary | ICD-10-CM

## 2021-06-04 LAB — BASIC METABOLIC PANEL
Anion gap: 9 (ref 5–15)
BUN: 10 mg/dL (ref 6–20)
CO2: 29 mmol/L (ref 22–32)
Calcium: 9.6 mg/dL (ref 8.9–10.3)
Chloride: 94 mmol/L — ABNORMAL LOW (ref 98–111)
Creatinine, Ser: 0.92 mg/dL (ref 0.61–1.24)
GFR, Estimated: >60 mL/min (ref >60)
Glucose, Bld: 444 mg/dL — ABNORMAL HIGH (ref 70–99)
Potassium: 4.1 mmol/L (ref 3.5–5.1)
Sodium: 132 mmol/L — ABNORMAL LOW (ref 135–145)

## 2021-06-04 LAB — COMPREHENSIVE METABOLIC PANEL
ALT: 31 U/L (ref 0–44)
AST: 26 U/L (ref 15–41)
Albumin: 3.4 g/dL — ABNORMAL LOW (ref 3.5–5.0)
Alkaline Phosphatase: 210 U/L — ABNORMAL HIGH (ref 38–126)
Anion gap: 9 (ref 5–15)
BUN: 11 mg/dL (ref 6–20)
CO2: 29 mmol/L (ref 22–32)
Calcium: 9.3 mg/dL (ref 8.9–10.3)
Chloride: 90 mmol/L — ABNORMAL LOW (ref 98–111)
Creatinine, Ser: 0.99 mg/dL (ref 0.61–1.24)
GFR, Estimated: >60 mL/min (ref >60)
Glucose, Bld: 535 mg/dL (ref 70–99)
Potassium: 4.1 mmol/L (ref 3.5–5.1)
Sodium: 128 mmol/L — ABNORMAL LOW (ref 135–145)
Total Bilirubin: 0.4 mg/dL (ref 0.3–1.2)
Total Protein: 8.4 g/dL — ABNORMAL HIGH (ref 6.5–8.1)

## 2021-06-04 LAB — CBC WITH DIFFERENTIAL/PLATELET
Abs Immature Granulocytes: 0.02 10*3/uL (ref 0.00–0.07)
Basophils Absolute: 0.1 10*3/uL (ref 0.0–0.1)
Basophils Relative: 1 %
Eosinophils Absolute: 0.3 10*3/uL (ref 0.0–0.5)
Eosinophils Relative: 3 %
HCT: 41.4 % (ref 39.0–52.0)
Hemoglobin: 14.4 g/dL (ref 13.0–17.0)
Immature Granulocytes: 0 %
Lymphocytes Relative: 27 %
Lymphs Abs: 2.6 10*3/uL (ref 0.7–4.0)
MCH: 30.3 pg (ref 26.0–34.0)
MCHC: 34.8 g/dL (ref 30.0–36.0)
MCV: 87.2 fL (ref 80.0–100.0)
Monocytes Absolute: 0.9 10*3/uL (ref 0.1–1.0)
Monocytes Relative: 9 %
Neutro Abs: 5.8 10*3/uL (ref 1.7–7.7)
Neutrophils Relative %: 60 %
Platelets: 212 10*3/uL (ref 150–400)
RBC: 4.75 MIL/uL (ref 4.22–5.81)
RDW: 13.3 % (ref 11.5–15.5)
WBC: 9.6 10*3/uL (ref 4.0–10.5)
nRBC: 0 % (ref 0.0–0.2)

## 2021-06-04 LAB — HEMOGLOBIN A1C
Hgb A1c MFr Bld: 7.8 % — ABNORMAL HIGH (ref 4.8–5.6)
Mean Plasma Glucose: 177.16 mg/dL

## 2021-06-04 LAB — GLUCOSE, CAPILLARY
Glucose-Capillary: 397 mg/dL — ABNORMAL HIGH (ref 70–99)
Glucose-Capillary: 395 mg/dL — ABNORMAL HIGH (ref 70–99)

## 2021-06-04 LAB — C-REACTIVE PROTEIN: CRP: 0.9 mg/dL (ref <1.0)

## 2021-06-04 MED ORDER — GLIPIZIDE 5 MG PO TABS
5.0000 mg | ORAL_TABLET | Freq: Every day | ORAL | Status: DC
  Administered 2021-06-05 – 2021-06-08 (×4): 5 mg via ORAL
  Filled 2021-06-04 (×4): qty 1

## 2021-06-04 MED ORDER — OXYCODONE HCL 5 MG PO TABS
15.0000 mg | ORAL_TABLET | ORAL | Status: DC | PRN
  Administered 2021-06-04 – 2021-06-22 (×10): 15 mg via ORAL
  Filled 2021-06-04 (×10): qty 3

## 2021-06-04 MED ORDER — INSULIN ASPART 100 UNIT/ML IJ SOLN
0.0000 [IU] | Freq: Every day | INTRAMUSCULAR | Status: DC
  Administered 2021-06-04: 5 [IU] via SUBCUTANEOUS

## 2021-06-04 MED ORDER — INSULIN ASPART 100 UNIT/ML IJ SOLN
0.0000 [IU] | Freq: Three times a day (TID) | INTRAMUSCULAR | Status: DC
  Administered 2021-06-04: 9 [IU] via SUBCUTANEOUS
  Administered 2021-06-05: 5 [IU] via SUBCUTANEOUS

## 2021-06-04 MED ORDER — DIVALPROEX SODIUM ER 500 MG PO TB24
1500.0000 mg | ORAL_TABLET | Freq: Every day | ORAL | Status: DC
  Administered 2021-06-04 – 2021-06-08 (×5): 1500 mg via ORAL
  Filled 2021-06-04 (×6): qty 3

## 2021-06-04 MED ORDER — METFORMIN HCL 500 MG PO TABS
500.0000 mg | ORAL_TABLET | Freq: Two times a day (BID) | ORAL | Status: DC
  Administered 2021-06-04 – 2021-07-11 (×74): 500 mg via ORAL
  Filled 2021-06-04 (×73): qty 1
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
## 2021-06-04 NOTE — Plan of Care (Signed)
  Problem: Clinical Measurements: Goal: Ability to maintain clinical measurements within normal limits will improve Outcome: Progressing Goal: Will remain free from infection Outcome: Progressing Goal: Diagnostic test results will improve Outcome: Progressing Goal: Respiratory complications will improve Outcome: Progressing   Problem: Activity: Goal: Risk for activity intolerance will decrease Outcome: Progressing   Problem: Elimination: Goal: Will not experience complications related to bowel motility Outcome: Progressing

## 2021-06-04 NOTE — Progress Notes (Signed)
CSW sent patient's most recent psychiatric note by Dr. Gasper Sells to Marlene Lard of [facility]'s legal team for review in preparation for next week's court hearing.  Edwin Dada, MSW, LCSW Transitions of Care  Clinical Social Worker II [phone]
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
## 2021-06-04 NOTE — Progress Notes (Signed)
Inpatient Diabetes Program Recommendations  AACE/ADA: New Consensus Statement on Inpatient Glycemic Control  Target Ranges:  Prepandial:   less than 140 mg/dL      Peak postprandial:   less than 180 mg/dL (1-2 hours)      Critically ill patients:  140 - 180 mg/dL    Latest Reference Range & Units 05/11/21 11:48 05/12/21 03:24 05/23/21 16:40 05/29/21 00:11 06/04/21 09:56  Glucose 70 - 99 mg/dL 92 660 (H) 600 (H) 459 (H) 535 (HH)    Latest Reference Range & Units 05/05/21 01:50  Hemoglobin A1C 4.8 - 5.6 % 4.9   Review of Glycemic Control  Diabetes history: NO Outpatient Diabetes medications: NA Current orders for Inpatient glycemic control: None  Inpatient Diabetes Program Recommendations:    Insulin: Lab glucose 535 mg/dl today at 9:77 am. Please consider ordering CBGs AC&HS with Novolog 0-15 units TID with meals and Novolog 0-5 units QHS.   Diet: May want to consider changing diet from Regular to Carb Modified.  Thanks, Orlando Penner, RN, MSN, CDE Diabetes Coordinator Inpatient Diabetes Program [phone] (Team Pager from 8am to 5pm)

## 2021-06-05 LAB — GLUCOSE, CAPILLARY
Glucose-Capillary: 164 mg/dL — ABNORMAL HIGH (ref 70–99)
Glucose-Capillary: 282 mg/dL — ABNORMAL HIGH (ref 70–99)
Glucose-Capillary: 159 mg/dL — ABNORMAL HIGH (ref 70–99)
Glucose-Capillary: 294 mg/dL — ABNORMAL HIGH (ref 70–99)

## 2021-06-05 MED ORDER — INSULIN ASPART 100 UNIT/ML IJ SOLN
0.0000 [IU] | INTRAMUSCULAR | Status: DC
Start: 2021-06-05 — End: 2021-06-05

## 2021-06-05 MED ORDER — INSULIN ASPART 100 UNIT/ML IJ SOLN
0.0000 [IU] | INTRAMUSCULAR | Status: DC
Start: 2021-06-05 — End: 2021-06-08
  Administered 2021-06-05: 2 [IU] via SUBCUTANEOUS
  Administered 2021-06-05: 12 [IU] via SUBCUTANEOUS
  Administered 2021-06-06: 8 [IU] via SUBCUTANEOUS
  Administered 2021-06-06: 20 [IU] via SUBCUTANEOUS
  Administered 2021-06-07: 2 [IU] via SUBCUTANEOUS
  Administered 2021-06-07: 8 [IU] via SUBCUTANEOUS
  Administered 2021-06-07 – 2021-06-08 (×3): 2 [IU] via SUBCUTANEOUS
  Administered 2021-06-08: 12 [IU] via SUBCUTANEOUS

## 2021-06-05 NOTE — Plan of Care (Signed)
°  Problem: Clinical Measurements: °Goal: Ability to maintain clinical measurements within normal limits will improve °Outcome: Progressing °Goal: Will remain free from infection °Outcome: Progressing °Goal: Diagnostic test results will improve °Outcome: Progressing °Goal: Respiratory complications will improve °Outcome: Progressing °Goal: Cardiovascular complication will be avoided °Outcome: Progressing °  °Problem: Activity: °Goal: Risk for activity intolerance will decrease °Outcome: Progressing °  °Problem: Nutrition: °Goal: Adequate nutrition will be maintained °Outcome: Progressing °  °Problem: Coping: °Goal: Level of anxiety will decrease °Outcome: Progressing °  °Problem: Elimination: °Goal: Will not experience complications related to bowel motility °Outcome: Progressing °Goal: Will not experience complications related to urinary retention °Outcome: Progressing °  °Problem: Pain Managment: °Goal: General experience of comfort will improve °Outcome: Progressing °  °Problem: Safety: °Goal: Ability to remain free from injury will improve °Outcome: Progressing °  °
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
## 2021-06-06 LAB — BASIC METABOLIC PANEL
Anion gap: 12 (ref 5–15)
BUN: 14 mg/dL (ref 6–20)
CO2: 26 mmol/L (ref 22–32)
Calcium: 9.3 mg/dL (ref 8.9–10.3)
Chloride: 95 mmol/L — ABNORMAL LOW (ref 98–111)
Creatinine, Ser: 0.84 mg/dL (ref 0.61–1.24)
GFR, Estimated: >60 mL/min (ref >60)
Glucose, Bld: 173 mg/dL — ABNORMAL HIGH (ref 70–99)
Potassium: 3.7 mmol/L (ref 3.5–5.1)
Sodium: 133 mmol/L — ABNORMAL LOW (ref 135–145)

## 2021-06-06 LAB — CBC
HCT: 40.5 % (ref 39.0–52.0)
Hemoglobin: 13.9 g/dL (ref 13.0–17.0)
MCH: 30.1 pg (ref 26.0–34.0)
MCHC: 34.3 g/dL (ref 30.0–36.0)
MCV: 87.7 fL (ref 80.0–100.0)
Platelets: 209 10*3/uL (ref 150–400)
RBC: 4.62 MIL/uL (ref 4.22–5.81)
RDW: 13.6 % (ref 11.5–15.5)
WBC: 8.3 10*3/uL (ref 4.0–10.5)
nRBC: 0 % (ref 0.0–0.2)

## 2021-06-06 LAB — GLUCOSE, CAPILLARY
Glucose-Capillary: 174 mg/dL — ABNORMAL HIGH (ref 70–99)
Glucose-Capillary: 353 mg/dL — ABNORMAL HIGH (ref 70–99)
Glucose-Capillary: 78 mg/dL (ref 70–99)
Glucose-Capillary: 220 mg/dL — ABNORMAL HIGH (ref 70–99)
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
## 2021-06-06 NOTE — Progress Notes (Signed)
Refused morning labs.

## 2021-06-07 LAB — GLUCOSE, CAPILLARY
Glucose-Capillary: 143 mg/dL — ABNORMAL HIGH (ref 70–99)
Glucose-Capillary: 148 mg/dL — ABNORMAL HIGH (ref 70–99)
Glucose-Capillary: 152 mg/dL — ABNORMAL HIGH (ref 70–99)
Glucose-Capillary: 274 mg/dL — ABNORMAL HIGH (ref 70–99)

## 2021-06-07 MED ORDER — GABAPENTIN 100 MG PO CAPS
100.0000 mg | ORAL_CAPSULE | Freq: Three times a day (TID) | ORAL | Status: DC
  Administered 2021-06-07 – 2021-07-30 (×158): 100 mg via ORAL
  Filled 2021-06-07 (×157): qty 1

## 2021-06-07 NOTE — Progress Notes (Signed)
PT Cancellation Note  Patient Details Name: Jerome [name] MRN: [id number] DOB: 1960/12/29   Cancelled Treatment:    Reason Eval/Treat Not Completed: Patient declined, no reason specified.  Pt is refusing OOB mobility at this time.  PT communicated with mobility tech who will check on him later this afternoon.    Thanks,  Corinna Capra, PT, DPT  Acute Rehabilitation Ortho Tech Supervisor [phone] pager #[phone] office      Lurena Joiner B [place] 06/07/2021, 12:03 PM
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
## 2021-06-08 LAB — GLUCOSE, CAPILLARY
Glucose-Capillary: 186 mg/dL — ABNORMAL HIGH (ref 70–99)
Glucose-Capillary: 240 mg/dL — ABNORMAL HIGH (ref 70–99)
Glucose-Capillary: 251 mg/dL — ABNORMAL HIGH (ref 70–99)
Glucose-Capillary: 252 mg/dL — ABNORMAL HIGH (ref 70–99)
Glucose-Capillary: 253 mg/dL — ABNORMAL HIGH (ref 70–99)
Glucose-Capillary: 261 mg/dL — ABNORMAL HIGH (ref 70–99)
Glucose-Capillary: 59 mg/dL — ABNORMAL LOW (ref 70–99)
Glucose-Capillary: 87 mg/dL (ref 70–99)

## 2021-06-08 LAB — VALPROIC ACID LEVEL: Valproic Acid Lvl: 33 ug/mL — ABNORMAL LOW (ref 50.0–100.0)

## 2021-06-08 MED ORDER — GLIPIZIDE 5 MG PO TABS
2.5000 mg | ORAL_TABLET | Freq: Every day | ORAL | Status: DC
  Administered 2021-06-09 – 2021-07-30 (×52): 2.5 mg via ORAL
  Filled 2021-06-08 (×55): qty 1

## 2021-06-08 MED ORDER — INSULIN ASPART 100 UNIT/ML IJ SOLN
2.0000 [IU] | Freq: Three times a day (TID) | INTRAMUSCULAR | Status: DC
  Administered 2021-06-08 – 2021-06-20 (×31): 2 [IU] via SUBCUTANEOUS

## 2021-06-08 MED ORDER — INSULIN ASPART 100 UNIT/ML IJ SOLN
0.0000 [IU] | Freq: Three times a day (TID) | INTRAMUSCULAR | Status: DC
  Administered 2021-06-08: 5 [IU] via SUBCUTANEOUS
  Administered 2021-06-08: 3 [IU] via SUBCUTANEOUS
  Administered 2021-06-09: 09:00:00 2 [IU] via SUBCUTANEOUS
  Administered 2021-06-09: 12:00:00 3 [IU] via SUBCUTANEOUS
  Administered 2021-06-09: 16:00:00 7 [IU] via SUBCUTANEOUS
  Administered 2021-06-10: 1 [IU] via SUBCUTANEOUS
  Administered 2021-06-10 (×2): 2 [IU] via SUBCUTANEOUS
  Administered 2021-06-11: 1 [IU] via SUBCUTANEOUS
  Administered 2021-06-11 (×2): 5 [IU] via SUBCUTANEOUS
  Administered 2021-06-12 (×2): 2 [IU] via SUBCUTANEOUS
  Administered 2021-06-12: 3 [IU] via SUBCUTANEOUS
  Administered 2021-06-13: 18:00:00 2 [IU] via SUBCUTANEOUS
  Administered 2021-06-13: 13:00:00 1 [IU] via SUBCUTANEOUS
  Administered 2021-06-13: 09:00:00 3 [IU] via SUBCUTANEOUS
  Administered 2021-06-14 – 2021-06-18 (×7): 2 [IU] via SUBCUTANEOUS
  Administered 2021-06-18 – 2021-06-20 (×3): 1 [IU] via SUBCUTANEOUS
  Administered 2021-06-21: 17:00:00 2 [IU] via SUBCUTANEOUS
  Administered 2021-06-21 – 2021-06-26 (×6): 1 [IU] via SUBCUTANEOUS
  Administered 2021-06-27: 2 [IU] via SUBCUTANEOUS
  Administered 2021-06-29 – 2021-07-01 (×3): 1 [IU] via SUBCUTANEOUS
  Administered 2021-07-02: 2 [IU] via SUBCUTANEOUS
  Administered 2021-07-02 – 2021-07-04 (×3): 1 [IU] via SUBCUTANEOUS
  Administered 2021-07-04: 3 [IU] via SUBCUTANEOUS
  Administered 2021-07-05: 2 [IU] via SUBCUTANEOUS
  Administered 2021-07-05: 1 [IU] via SUBCUTANEOUS
  Administered 2021-07-06 (×2): 2 [IU] via SUBCUTANEOUS

## 2021-06-08 MED ORDER — INSULIN ASPART 100 UNIT/ML IJ SOLN
0.0000 [IU] | Freq: Every day | INTRAMUSCULAR | Status: DC
  Administered 2021-06-11: 2 [IU] via SUBCUTANEOUS
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
## 2021-06-08 NOTE — Progress Notes (Signed)
Inpatient Diabetes Program Recommendations  AACE/ADA: New Consensus Statement on Inpatient Glycemic Control   Target Ranges:  Prepandial:   less than 140 mg/dL      Peak postprandial:   less than 180 mg/dL (1-2 hours)      Critically ill patients:  140 - 180 mg/dL    Latest Reference Range & Units 06/08/21 00:14 06/08/21 05:03 06/08/21 05:55 06/08/21 08:04  Glucose-Capillary 70 - 99 mg/dL 383 (H) 338 (H) 329 (H) 253 (H)    Latest Reference Range & Units 06/07/21 00:58 06/07/21 04:59 06/07/21 17:02 06/07/21 20:48  Glucose-Capillary 70 - 99 mg/dL 191 (H) 660 (H) 600 (H) 143 (H)   Review of Glycemic Control  Current orders for Inpatient glycemic control: Novolog 0-9 units TID with meals, Novolog 0-5 units QHS, Metformin 500 mg BID, Glipizide 5 mg QAM  Inpatient Diabetes Program Recommendations:    Oral DM meds: May want to consider increasing dose of Metformin and Glipizide if appropriate.   Thanks, Orlando Penner, RN, MSN, CDE Diabetes Coordinator Inpatient Diabetes Program [phone] (Team Pager from 8am to 5pm)

## 2021-06-08 NOTE — Progress Notes (Signed)
Mobility Specialist Criteria Algorithm Info.   06/08/21 1415  Mobility  Activity Ambulated in hall (in chair before and after)  Range of Motion/Exercises Active;All extremities  Level of Assistance Contact guard assist, steadying assist (Min A sit/stand)  Assistive Device Front wheel walker  RUE Weight Bearing WBAT  LUE Weight Bearing WBAT  RLE Weight Bearing WBAT  LLE Weight Bearing WBAT  Distance Ambulated (ft) 80 ft  Mobility Ambulated with assistance in hallway  Mobility Response Tolerated well  Mobility performed by Mobility specialist  Bed Position Chair   Patient received in chair agreeable to participate. Ambulated in hallway at min guard with slow steady gait. Returned to chair without complaint or incident. Was left with all needs met, call bell in reach.  06/08/2021 4:23 PM
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
## 2021-06-08 NOTE — Progress Notes (Signed)
CSW received [employer] link for patient's guardianship hearing on Thursday 12/15 at 2pm.  CSW shared link with Marlene Lard of [facility] Legal Team.  Edwin Dada, MSW, LCSW Transitions of Care   Clinical Social Worker II [phone]

## 2021-06-09 LAB — GLUCOSE, CAPILLARY
Glucose-Capillary: 243 mg/dL — ABNORMAL HIGH (ref 70–99)
Glucose-Capillary: 239 mg/dL — ABNORMAL HIGH (ref 70–99)
Glucose-Capillary: 327 mg/dL — ABNORMAL HIGH (ref 70–99)
Glucose-Capillary: 135 mg/dL — ABNORMAL HIGH (ref 70–99)

## 2021-06-09 MED ORDER — DIVALPROEX SODIUM ER 500 MG PO TB24
1750.0000 mg | ORAL_TABLET | Freq: Every day | ORAL | Status: DC
  Administered 2021-06-09 – 2021-06-24 (×16): 1750 mg via ORAL
  Filled 2021-06-09 (×20): qty 1
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
## 2021-06-09 NOTE — Progress Notes (Signed)
Inpatient Diabetes Program Recommendations  AACE/ADA: New Consensus Statement on Inpatient Glycemic Control   Target Ranges:  Prepandial:   less than 140 mg/dL      Peak postprandial:   less than 180 mg/dL (1-2 hours)      Critically ill patients:  140 - 180 mg/dL    Latest Reference Range & Units 06/09/21 05:10  Glucose-Capillary 70 - 99 mg/dL 627 (H)    Latest Reference Range & Units 06/08/21 05:55 06/08/21 08:04 06/08/21 9:10 06/08/21 11:45 06/08/21 12:08 06/08/21 16:08 06/08/21 20:06  Glucose-Capillary 70 - 99 mg/dL 035 (H)  Novolog 12 units  253 (H)  Glipizide 5 mg  Metformin 500 mg    Novolog 7 units 59 (L) 87 240 (H)  Novolog 5 units  Metformin 500 mg 186 (H)   Review of Glycemic Control  Current orders for Inpatient glycemic control: Novolog 0-9 units TID with meals, Novolog 0-5 units QHS, Novolog 2 units TID with meals, Metformin 500 mg BID, Glipizide 2.5 mg QAM   Inpatient Diabetes Program Recommendations:    DM meds: May want to consider increasing Glipizide back to 5 mg daily and discontinuing Novolog 2 units TID for meal coverage.  Then if glucose remains consistently elevated, may want to consider increasing Metformin to 1000 mg BID.   NOTE: Noted glucose down to 59 mg/dl at 00:93 yesterday. Hypoglycemia occurred after Novolog 7 units correction and meal coverage was given at 9:10 am.  Patient received Glipizide 5 mg on 06/08/21.   Thanks, Orlando Penner, RN, MSN, CDE Diabetes Coordinator Inpatient Diabetes Program [phone] (Team Pager from 8am to 5pm)

## 2021-06-09 NOTE — Progress Notes (Signed)
Mobility Specialist Criteria Algorithm Info.   06/09/21 1000  Mobility  Activity Ambulated in hall;Ambulated in room;Dangled on edge of bed  Range of Motion/Exercises Active;All extremities  Level of Assistance Contact guard assist, steadying assist  Assistive Device Front wheel walker  RUE Weight Bearing WBAT  LUE Weight Bearing WBAT  RLE Weight Bearing WBAT  LLE Weight Bearing WBAT  Distance Ambulated (ft) 200 ft  Mobility Ambulated with assistance in hallway  Mobility Response Tolerated well  Mobility performed by Mobility specialist  Bed Position Semi-fowlers   Patient received in bed willing to participate but with max encouragement. Ambulated short distance in room and hallway at min guard/min A with steady gait. Returned to room without complaint or incident. Was left lying in bed with all needs met and call bell in reach.  06/09/2021 10:00 AM
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
## 2021-06-09 NOTE — Progress Notes (Signed)
CSW and RN CM spoke with unit secretary Fabian November to request her assistance with preparing the iPad for use for tomorrow's court hearing. Fabian November is agreeable to assist patient in participating in the court hearing tomorrow. CSW forwarded Webex link to [place] to access the meeting.  Edwin Dada, MSW, LCSW Transitions of Care   Clinical Social Worker II [phone]

## 2021-06-10 LAB — GLUCOSE, CAPILLARY
Glucose-Capillary: 177 mg/dL — ABNORMAL HIGH (ref 70–99)
Glucose-Capillary: 146 mg/dL — ABNORMAL HIGH (ref 70–99)
Glucose-Capillary: 198 mg/dL — ABNORMAL HIGH (ref 70–99)
Glucose-Capillary: 195 mg/dL — ABNORMAL HIGH (ref 70–99)
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
## 2021-06-11 LAB — GLUCOSE, CAPILLARY
Glucose-Capillary: 284 mg/dL — ABNORMAL HIGH (ref 70–99)
Glucose-Capillary: 260 mg/dL — ABNORMAL HIGH (ref 70–99)
Glucose-Capillary: 126 mg/dL — ABNORMAL HIGH (ref 70–99)
Glucose-Capillary: 249 mg/dL — ABNORMAL HIGH (ref 70–99)
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
## 2021-06-11 NOTE — Progress Notes (Signed)
Mobility Specialist Criteria Algorithm Info.   06/11/21 0945  Mobility  Activity Ambulated in hall;Ambulated in room;Ambulated to bathroom;Dangled on edge of bed  Range of Motion/Exercises Active;All extremities  Level of Assistance Standby assist, set-up cues, supervision of patient - no hands on  Assistive Device Front wheel walker  RUE Weight Bearing WBAT  LUE Weight Bearing WBAT  RLE Weight Bearing WBAT  LLE Weight Bearing WBAT  Distance Ambulated (ft) 280 ft  Mobility Ambulated with assistance in hallway  Mobility Response Tolerated well  Mobility performed by Mobility specialist  Bed Position Semi-fowlers   Patient received in bed. Ambulated to bathroom then in hallway with supervision. Tolerated ambulation well without complaint or incident was left dangling EOB with all needs met.   06/11/2021 09:45 AM
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
## 2021-06-12 DIAGNOSIS — S42201A Unspecified fracture of upper end of right humerus, initial encounter for closed fracture: Secondary | ICD-10-CM

## 2021-06-12 LAB — GLUCOSE, CAPILLARY
Glucose-Capillary: 226 mg/dL — ABNORMAL HIGH (ref 70–99)
Glucose-Capillary: 204 mg/dL — ABNORMAL HIGH (ref 70–99)
Glucose-Capillary: 178 mg/dL — ABNORMAL HIGH (ref 70–99)
Glucose-Capillary: 176 mg/dL — ABNORMAL HIGH (ref 70–99)
Glucose-Capillary: 156 mg/dL — ABNORMAL HIGH (ref 70–99)

## 2021-06-12 LAB — COMPREHENSIVE METABOLIC PANEL
ALT: 22 U/L (ref 0–44)
AST: 24 U/L (ref 15–41)
Albumin: 3.3 g/dL — ABNORMAL LOW (ref 3.5–5.0)
Alkaline Phosphatase: 156 U/L — ABNORMAL HIGH (ref 38–126)
Anion gap: 10 (ref 5–15)
BUN: 10 mg/dL (ref 6–20)
CO2: 27 mmol/L (ref 22–32)
Calcium: 9.8 mg/dL (ref 8.9–10.3)
Chloride: 101 mmol/L (ref 98–111)
Creatinine, Ser: 0.89 mg/dL (ref 0.61–1.24)
GFR, Estimated: >60 mL/min (ref >60)
Glucose, Bld: 223 mg/dL — ABNORMAL HIGH (ref 70–99)
Potassium: 3.9 mmol/L (ref 3.5–5.1)
Sodium: 138 mmol/L (ref 135–145)
Total Bilirubin: 0.6 mg/dL (ref 0.3–1.2)
Total Protein: 7.6 g/dL (ref 6.5–8.1)

## 2021-06-12 NOTE — Plan of Care (Signed)
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
## 2021-06-13 LAB — GLUCOSE, CAPILLARY
Glucose-Capillary: 236 mg/dL — ABNORMAL HIGH (ref 70–99)
Glucose-Capillary: 148 mg/dL — ABNORMAL HIGH (ref 70–99)
Glucose-Capillary: 177 mg/dL — ABNORMAL HIGH (ref 70–99)
Glucose-Capillary: 141 mg/dL — ABNORMAL HIGH (ref 70–99)

## 2021-06-13 LAB — VALPROIC ACID LEVEL: Valproic Acid Lvl: 45 ug/mL — ABNORMAL LOW (ref 50.0–100.0)

## 2021-06-13 NOTE — Progress Notes (Signed)
Orthopedic Tech Progress Note Patient Details:  Jerome [name] 07/14/1960 [id number]  Patient ID: Jerome [name], male   DOB: 05-Apr-1961, 60 y.o.   MRN: [id number] I asked RN if patient needed the knee immobilizer. They said the patient was up and walking around and didn't need it. [name] 06/13/2021, 5:49 AM
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
## 2021-06-14 LAB — GLUCOSE, CAPILLARY
Glucose-Capillary: 225 mg/dL — ABNORMAL HIGH (ref 70–99)
Glucose-Capillary: 180 mg/dL — ABNORMAL HIGH (ref 70–99)
Glucose-Capillary: 137 mg/dL — ABNORMAL HIGH (ref 70–99)
Glucose-Capillary: 180 mg/dL — ABNORMAL HIGH (ref 70–99)
Glucose-Capillary: 152 mg/dL — ABNORMAL HIGH (ref 70–99)
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
## 2021-06-14 NOTE — Progress Notes (Signed)
Noticed after therapy  pt.had some serous drainage from a pea size hole located on the RLE. Wound cleansed and covered with gauze and foam. Dr. Truitt Merle

## 2021-06-14 NOTE — Progress Notes (Signed)
CSW attempted to reach patient's care coordinator at [facility] without success - a voicemail was left requesting a return call.  Edwin Dada, MSW, LCSW Transitions of Care   Clinical Social Worker II [phone]

## 2021-06-15 DIAGNOSIS — F1010 Alcohol abuse, uncomplicated: Secondary | ICD-10-CM

## 2021-06-15 LAB — GLUCOSE, CAPILLARY
Glucose-Capillary: 128 mg/dL — ABNORMAL HIGH (ref 70–99)
Glucose-Capillary: 98 mg/dL (ref 70–99)
Glucose-Capillary: 170 mg/dL — ABNORMAL HIGH (ref 70–99)
Glucose-Capillary: 129 mg/dL — ABNORMAL HIGH (ref 70–99)
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
## 2021-06-16 LAB — GLUCOSE, CAPILLARY
Glucose-Capillary: 118 mg/dL — ABNORMAL HIGH (ref 70–99)
Glucose-Capillary: 168 mg/dL — ABNORMAL HIGH (ref 70–99)
Glucose-Capillary: 71 mg/dL (ref 70–99)
Glucose-Capillary: 128 mg/dL — ABNORMAL HIGH (ref 70–99)

## 2021-06-16 MED ORDER — AMLODIPINE BESYLATE 10 MG PO TABS
10.0000 mg | ORAL_TABLET | Freq: Every day | ORAL | Status: DC
  Administered 2021-06-17 – 2021-07-30 (×43): 10 mg via ORAL
  Filled 2021-06-16 (×44): qty 1

## 2021-06-16 NOTE — Plan of Care (Signed)
?  Problem: Education: ?Goal: Knowledge of General Education information will improve ?Description: Including pain rating scale, medication(s)/side effects and non-pharmacologic comfort measures ?Outcome: Progressing ?  ?Problem: Health Behavior/Discharge Planning: ?Goal: Ability to manage health-related needs will improve ?Outcome: Progressing ?  ?Problem: Clinical Measurements: ?Goal: Ability to maintain clinical measurements within normal limits will improve ?Outcome: Progressing ?Goal: Will remain free from infection ?Outcome: Progressing ?Goal: Diagnostic test results will improve ?Outcome: Progressing ?Goal: Respiratory complications will improve ?Outcome: Progressing ?Goal: Cardiovascular complication will be avoided ?Outcome: Progressing ?  ?Problem: Activity: ?Goal: Risk for activity intolerance will decrease ?Outcome: Progressing ?  ?Problem: Nutrition: ?Goal: Adequate nutrition will be maintained ?Outcome: Progressing ?  ?Problem: Coping: ?Goal: Level of anxiety will decrease ?Outcome: Progressing ?  ?Problem: Elimination: ?Goal: Will not experience complications related to bowel motility ?Outcome: Progressing ?Goal: Will not experience complications related to urinary retention ?Outcome: Progressing ?  ?Problem: Pain Managment: ?Goal: General experience of comfort will improve ?Outcome: Progressing ?  ?Problem: Safety: ?Goal: Ability to remain free from injury will improve ?Outcome: Progressing ?  ?Problem: Skin Integrity: ?Goal: Risk for impaired skin integrity will decrease ?Outcome: Progressing ?  ?Problem: Education: ?Goal: Required Educational Video(s) ?Outcome: Progressing ?  ?Problem: Clinical Measurements: ?Goal: Ability to maintain clinical measurements within normal limits will improve ?Outcome: Progressing ?Goal: Postoperative complications will be avoided or minimized ?Outcome: Progressing ?  ?
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
## 2021-06-16 NOTE — Progress Notes (Signed)
Mobility Specialist Criteria Algorithm Info.   06/16/21 0939  Mobility  Activity Ambulated in room;Ambulated to bathroom;Ambulated in hall  Range of Motion/Exercises Active;All extremities  Level of Assistance Contact guard assist, steadying assist  Assistive Device Front wheel walker  RUE Weight Bearing WBAT  LUE Weight Bearing WBAT  RLE Weight Bearing WBAT  LLE Weight Bearing WBAT  Distance Ambulated (ft) 280 ft  Mobility Ambulated with assistance in hallway  Mobility Response Tolerated well  Mobility performed by Mobility specialist  Bed Position Semi-fowlers   Patient ambulated in hallway min guard with slow steady gait. Tolerated ambulation well without complaint or incident. Was left lying supine in bed with all needs met.     06/16/2021 09:39 AM
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
## 2021-06-16 NOTE — Progress Notes (Addendum)
CSW spoke with Darl Pikes at [facility] to request her assistance with reaching patient's care coordinator.  Edwin Dada, MSW, LCSW Transitions of Care   Clinical Social Worker II [phone]

## 2021-06-16 NOTE — Consult Note (Signed)
Brief Psychiatry Consult Note  The patient was last seen by the psychiatry service on 12/19. Interim documentation by primary team and nursing staff has been reviewed. At this time, there is no evidence of acute psychiatric disturbance requiring ongoing psychiatric consultation. He has not been given olanzapine prn for more than ten days. Noted that although the level of Depakote is in subtherapeutic range, the indication of this medication is for agitation, and the current dose is working well for him.   Please see last consult note for full assessment. Final medication recommendations are as follows:  - Continue Zyprexa to 10mg  QHS -- Continue Zyprexa 2.5mg  IM PRN q6h PRN for agitation - Continue Depakote ER 1750 mg qhs - Continue to monitor labs, which includes LFT, Plt while he is on Depakote.   We will sign off at this time. This has been communicated to the primary team. If issues arise in the future, don't hesitate to reconsult the Psychiatry Inpatient Consult Service.   , MD
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
## 2021-06-17 LAB — GLUCOSE, CAPILLARY
Glucose-Capillary: 114 mg/dL — ABNORMAL HIGH (ref 70–99)
Glucose-Capillary: 114 mg/dL — ABNORMAL HIGH (ref 70–99)
Glucose-Capillary: 91 mg/dL (ref 70–99)
Glucose-Capillary: 157 mg/dL — ABNORMAL HIGH (ref 70–99)
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
## 2021-06-17 NOTE — Plan of Care (Signed)
?  Problem: Education: ?Goal: Knowledge of General Education information will improve ?Description: Including pain rating scale, medication(s)/side effects and non-pharmacologic comfort measures ?Outcome: Progressing ?  ?Problem: Health Behavior/Discharge Planning: ?Goal: Ability to manage health-related needs will improve ?Outcome: Progressing ?  ?Problem: Clinical Measurements: ?Goal: Ability to maintain clinical measurements within normal limits will improve ?Outcome: Progressing ?Goal: Will remain free from infection ?Outcome: Progressing ?Goal: Diagnostic test results will improve ?Outcome: Progressing ?Goal: Respiratory complications will improve ?Outcome: Progressing ?Goal: Cardiovascular complication will be avoided ?Outcome: Progressing ?  ?Problem: Activity: ?Goal: Risk for activity intolerance will decrease ?Outcome: Progressing ?  ?Problem: Nutrition: ?Goal: Adequate nutrition will be maintained ?Outcome: Progressing ?  ?Problem: Coping: ?Goal: Level of anxiety will decrease ?Outcome: Progressing ?  ?Problem: Elimination: ?Goal: Will not experience complications related to bowel motility ?Outcome: Progressing ?Goal: Will not experience complications related to urinary retention ?Outcome: Progressing ?  ?Problem: Pain Managment: ?Goal: General experience of comfort will improve ?Outcome: Progressing ?  ?Problem: Safety: ?Goal: Ability to remain free from injury will improve ?Outcome: Progressing ?  ?Problem: Skin Integrity: ?Goal: Risk for impaired skin integrity will decrease ?Outcome: Progressing ?  ?Problem: Education: ?Goal: Required Educational Video(s) ?Outcome: Progressing ?  ?Problem: Clinical Measurements: ?Goal: Ability to maintain clinical measurements within normal limits will improve ?Outcome: Progressing ?Goal: Postoperative complications will be avoided or minimized ?Outcome: Progressing ?  ?

## 2021-06-18 LAB — GLUCOSE, CAPILLARY
Glucose-Capillary: 182 mg/dL — ABNORMAL HIGH (ref 70–99)
Glucose-Capillary: 153 mg/dL — ABNORMAL HIGH (ref 70–99)
Glucose-Capillary: 103 mg/dL — ABNORMAL HIGH (ref 70–99)
Glucose-Capillary: 149 mg/dL — ABNORMAL HIGH (ref 70–99)
Glucose-Capillary: 97 mg/dL (ref 70–99)
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
## 2021-06-18 NOTE — Plan of Care (Signed)
  Problem: Activity: Goal: Risk for activity intolerance will decrease Outcome: Progressing   Problem: Nutrition: Goal: Adequate nutrition will be maintained Outcome: Progressing   Problem: Coping: Goal: Level of anxiety will decrease Outcome: Progressing   

## 2021-06-18 NOTE — Progress Notes (Signed)
Mobility Specialist Criteria Algorithm Info.   06/18/21 0907  Mobility  Activity Ambulated to bathroom;Dangled on edge of bed;Ambulated in room  Range of Motion/Exercises Active;All extremities  Level of Assistance Contact guard assist, steadying assist  Assistive Device Front wheel walker  RUE Weight Bearing WBAT  LUE Weight Bearing WBAT  RLE Weight Bearing WBAT  LLE Weight Bearing WBAT  Distance Ambulated (ft) 40 ft  Mobility Ambulated with assistance in room  Mobility Response Tolerated well  Mobility performed by Mobility specialist  Bed Position Semi-fowlers   Patient received dangling EOB requesting assistance to bathroom. Ambulated short distance in room with steady gait. Declined hallway ambulation but agreed to try after lunch. Returned to EOB without complaint or incident. Was left with all needs met and call bell in reach.   06/18/2021 10:28 AM

## 2021-06-18 NOTE — Progress Notes (Signed)
Ok to get hepatitis C VL reflex to genotype to confirm chronic infection per Junious Silk.  Ulyses Southward, PharmD, BCIDP, AAHIVP, CPP Infectious Disease Pharmacist 06/18/2021 12:52 PM

## 2021-06-19 LAB — GLUCOSE, CAPILLARY
Glucose-Capillary: 196 mg/dL — ABNORMAL HIGH (ref 70–99)
Glucose-Capillary: 85 mg/dL (ref 70–99)
Glucose-Capillary: 67 mg/dL — ABNORMAL LOW (ref 70–99)
Glucose-Capillary: 122 mg/dL — ABNORMAL HIGH (ref 70–99)
Glucose-Capillary: 129 mg/dL — ABNORMAL HIGH (ref 70–99)
Glucose-Capillary: 108 mg/dL — ABNORMAL HIGH (ref 70–99)
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
## 2021-06-20 LAB — GLUCOSE, CAPILLARY
Glucose-Capillary: 112 mg/dL — ABNORMAL HIGH (ref 70–99)
Glucose-Capillary: 141 mg/dL — ABNORMAL HIGH (ref 70–99)
Glucose-Capillary: 90 mg/dL (ref 70–99)
Glucose-Capillary: 107 mg/dL — ABNORMAL HIGH (ref 70–99)

## 2021-06-20 NOTE — Plan of Care (Signed)

## 2021-06-20 NOTE — Progress Notes (Signed)
Patient sitting on the chair eating his breakfast.  Mentions that he is feeling well.  Denies any complaints.  Vital signs noted to be stable this morning.    Noted to have 1 episode of hypoglycemia yesterday.  He is noted to be on SSI, meal coverage, glipizide and metformin for his diabetes.  CBGs reviewed.  We will stop his meal coverage.  If he has persistently low glucose levels then may have to discontinue the glipizide as well.  Will order labs for tomorrow.  Other medical issues are stable for the most part.    Please review progress note by Ms. Junious Silk on 12/23 for further details.  Jerome [name] 06/20/2021

## 2021-06-21 LAB — CBC
HCT: 41.2 % (ref 39.0–52.0)
Hemoglobin: 14.1 g/dL (ref 13.0–17.0)
MCH: 30.2 pg (ref 26.0–34.0)
MCHC: 34.2 g/dL (ref 30.0–36.0)
MCV: 88.2 fL (ref 80.0–100.0)
Platelets: 233 10*3/uL (ref 150–400)
RBC: 4.67 MIL/uL (ref 4.22–5.81)
RDW: 13.6 % (ref 11.5–15.5)
WBC: 7.2 10*3/uL (ref 4.0–10.5)
nRBC: 0 % (ref 0.0–0.2)

## 2021-06-21 LAB — BASIC METABOLIC PANEL
Anion gap: 10 (ref 5–15)
BUN: 11 mg/dL (ref 6–20)
CO2: 25 mmol/L (ref 22–32)
Calcium: 9.4 mg/dL (ref 8.9–10.3)
Chloride: 102 mmol/L (ref 98–111)
Creatinine, Ser: 0.85 mg/dL (ref 0.61–1.24)
GFR, Estimated: >60 mL/min (ref >60)
Glucose, Bld: 166 mg/dL — ABNORMAL HIGH (ref 70–99)
Potassium: 3.7 mmol/L (ref 3.5–5.1)
Sodium: 137 mmol/L (ref 135–145)

## 2021-06-21 LAB — MAGNESIUM: Magnesium: 1.6 mg/dL — ABNORMAL LOW (ref 1.7–2.4)

## 2021-06-21 LAB — GLUCOSE, CAPILLARY
Glucose-Capillary: 101 mg/dL — ABNORMAL HIGH (ref 70–99)
Glucose-Capillary: 124 mg/dL — ABNORMAL HIGH (ref 70–99)
Glucose-Capillary: 135 mg/dL — ABNORMAL HIGH (ref 70–99)
Glucose-Capillary: 158 mg/dL — ABNORMAL HIGH (ref 70–99)
Glucose-Capillary: 209 mg/dL — ABNORMAL HIGH (ref 70–99)

## 2021-06-21 MED ORDER — MAGNESIUM OXIDE -MG SUPPLEMENT 400 (240 MG) MG PO TABS
400.0000 mg | ORAL_TABLET | Freq: Two times a day (BID) | ORAL | Status: DC
  Administered 2021-06-21 – 2021-07-17 (×53): 400 mg via ORAL
  Filled 2021-06-21 (×53): qty 1
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
## 2021-06-21 NOTE — Progress Notes (Signed)
Mobility Specialist Criteria Algorithm Info.   06/21/21 0945  Mobility  Activity Ambulated in room;Ambulated to bathroom (Declined hallway ambulation)  Range of Motion/Exercises Active;All extremities  Level of Assistance Standby assist, set-up cues, supervision of patient - no hands on  Assistive Device Front wheel walker  RUE Weight Bearing WBAT  LUE Weight Bearing WBAT  RLE Weight Bearing WBAT  LLE Weight Bearing WBAT  Distance Ambulated (ft) 40 ft  Mobility Ambulated with assistance in room  Mobility Response Tolerated well  Mobility performed by Mobility specialist  Bed Position Semi-fowlers   Patient received in bed slightly agitated rambling about miscellaneous things. Agreed to participate but declined hallway ambulation. Ambulated short distance in room with supervision then requested to use bathroom. Returned to bed without complaint or incident. Was left with all need met, call bell in reach.     06/21/2021 3:37 PM

## 2021-06-22 ENCOUNTER — Inpatient Hospital Stay (HOSPITAL_COMMUNITY): Payer: No Typology Code available for payment source

## 2021-06-22 LAB — GLUCOSE, CAPILLARY
Glucose-Capillary: 100 mg/dL — ABNORMAL HIGH (ref 70–99)
Glucose-Capillary: 87 mg/dL (ref 70–99)
Glucose-Capillary: 117 mg/dL — ABNORMAL HIGH (ref 70–99)
Glucose-Capillary: 148 mg/dL — ABNORMAL HIGH (ref 70–99)

## 2021-06-22 LAB — HCV RNA QUANT RFLX ULTRA OR GENOTYP
HCV RNA Qnt(log copy/mL): 6.76 log10 IU/mL
HepC Qn: 5760000 IU/mL

## 2021-06-22 LAB — HEPATITIS C GENOTYPE

## 2021-06-22 LAB — MAGNESIUM: Magnesium: 1.9 mg/dL (ref 1.7–2.4)
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
## 2021-06-22 NOTE — Plan of Care (Signed)
?  Problem: Education: ?Goal: Knowledge of General Education information will improve ?Description: Including pain rating scale, medication(s)/side effects and non-pharmacologic comfort measures ?Outcome: Progressing ?  ?Problem: Health Behavior/Discharge Planning: ?Goal: Ability to manage health-related needs will improve ?Outcome: Progressing ?  ?Problem: Clinical Measurements: ?Goal: Ability to maintain clinical measurements within normal limits will improve ?Outcome: Progressing ?Goal: Will remain free from infection ?Outcome: Progressing ?Goal: Diagnostic test results will improve ?Outcome: Progressing ?Goal: Respiratory complications will improve ?Outcome: Progressing ?Goal: Cardiovascular complication will be avoided ?Outcome: Progressing ?  ?Problem: Activity: ?Goal: Risk for activity intolerance will decrease ?Outcome: Progressing ?  ?Problem: Nutrition: ?Goal: Adequate nutrition will be maintained ?Outcome: Progressing ?  ?Problem: Coping: ?Goal: Level of anxiety will decrease ?Outcome: Progressing ?  ?Problem: Elimination: ?Goal: Will not experience complications related to bowel motility ?Outcome: Progressing ?Goal: Will not experience complications related to urinary retention ?Outcome: Progressing ?  ?Problem: Pain Managment: ?Goal: General experience of comfort will improve ?Outcome: Progressing ?  ?Problem: Safety: ?Goal: Ability to remain free from injury will improve ?Outcome: Progressing ?  ?Problem: Skin Integrity: ?Goal: Risk for impaired skin integrity will decrease ?Outcome: Progressing ?  ?Problem: Education: ?Goal: Required Educational Video(s) ?Outcome: Progressing ?  ?Problem: Clinical Measurements: ?Goal: Ability to maintain clinical measurements within normal limits will improve ?Outcome: Progressing ?Goal: Postoperative complications will be avoided or minimized ?Outcome: Progressing ?  ?

## 2021-06-23 LAB — GLUCOSE, CAPILLARY
Glucose-Capillary: 86 mg/dL (ref 70–99)
Glucose-Capillary: 99 mg/dL (ref 70–99)
Glucose-Capillary: 121 mg/dL — ABNORMAL HIGH (ref 70–99)
Glucose-Capillary: 139 mg/dL — ABNORMAL HIGH (ref 70–99)

## 2021-06-23 NOTE — Plan of Care (Signed)
  Problem: Clinical Measurements: Goal: Diagnostic test results will improve Outcome: Progressing Goal: Respiratory complications will improve Outcome: Progressing Goal: Cardiovascular complication will be avoided Outcome: Progressing   
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
## 2021-06-23 NOTE — Plan of Care (Signed)
?  Problem: Education: ?Goal: Knowledge of General Education information will improve ?Description: Including pain rating scale, medication(s)/side effects and non-pharmacologic comfort measures ?Outcome: Progressing ?  ?Problem: Health Behavior/Discharge Planning: ?Goal: Ability to manage health-related needs will improve ?Outcome: Progressing ?  ?Problem: Clinical Measurements: ?Goal: Ability to maintain clinical measurements within normal limits will improve ?Outcome: Progressing ?Goal: Will remain free from infection ?Outcome: Progressing ?Goal: Diagnostic test results will improve ?Outcome: Progressing ?Goal: Respiratory complications will improve ?Outcome: Progressing ?Goal: Cardiovascular complication will be avoided ?Outcome: Progressing ?  ?Problem: Activity: ?Goal: Risk for activity intolerance will decrease ?Outcome: Progressing ?  ?Problem: Nutrition: ?Goal: Adequate nutrition will be maintained ?Outcome: Progressing ?  ?Problem: Coping: ?Goal: Level of anxiety will decrease ?Outcome: Progressing ?  ?Problem: Elimination: ?Goal: Will not experience complications related to bowel motility ?Outcome: Progressing ?Goal: Will not experience complications related to urinary retention ?Outcome: Progressing ?  ?Problem: Pain Managment: ?Goal: General experience of comfort will improve ?Outcome: Progressing ?  ?Problem: Safety: ?Goal: Ability to remain free from injury will improve ?Outcome: Progressing ?  ?Problem: Skin Integrity: ?Goal: Risk for impaired skin integrity will decrease ?Outcome: Progressing ?  ?Problem: Education: ?Goal: Required Educational Video(s) ?Outcome: Progressing ?  ?Problem: Clinical Measurements: ?Goal: Ability to maintain clinical measurements within normal limits will improve ?Outcome: Progressing ?Goal: Postoperative complications will be avoided or minimized ?Outcome: Progressing ?  ?
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
## 2021-06-24 LAB — GLUCOSE, CAPILLARY
Glucose-Capillary: 119 mg/dL — ABNORMAL HIGH (ref 70–99)
Glucose-Capillary: 95 mg/dL (ref 70–99)
Glucose-Capillary: 128 mg/dL — ABNORMAL HIGH (ref 70–99)
Glucose-Capillary: 99 mg/dL (ref 70–99)

## 2021-06-24 NOTE — Progress Notes (Signed)
PT Cancellation Note  Patient Details Name: Jerome [name] MRN: [id number] DOB: 1961/05/30   Cancelled Treatment:    Reason Eval/Treat Not Completed: Patient declined, no reason specified.  This was unusual as pt has been working with this PT well.     [name] 06/24/2021, 12:46 PM Lanee Chain M,PT Acute Rehab Services [phone] (pager)

## 2021-06-24 NOTE — Plan of Care (Signed)
?  Problem: Education: ?Goal: Knowledge of General Education information will improve ?Description: Including pain rating scale, medication(s)/side effects and non-pharmacologic comfort measures ?Outcome: Progressing ?  ?Problem: Health Behavior/Discharge Planning: ?Goal: Ability to manage health-related needs will improve ?Outcome: Progressing ?  ?Problem: Clinical Measurements: ?Goal: Ability to maintain clinical measurements within normal limits will improve ?Outcome: Progressing ?Goal: Will remain free from infection ?Outcome: Progressing ?Goal: Diagnostic test results will improve ?Outcome: Progressing ?Goal: Respiratory complications will improve ?Outcome: Progressing ?Goal: Cardiovascular complication will be avoided ?Outcome: Progressing ?  ?Problem: Activity: ?Goal: Risk for activity intolerance will decrease ?Outcome: Progressing ?  ?Problem: Nutrition: ?Goal: Adequate nutrition will be maintained ?Outcome: Progressing ?  ?Problem: Coping: ?Goal: Level of anxiety will decrease ?Outcome: Progressing ?  ?Problem: Elimination: ?Goal: Will not experience complications related to bowel motility ?Outcome: Progressing ?Goal: Will not experience complications related to urinary retention ?Outcome: Progressing ?  ?Problem: Pain Managment: ?Goal: General experience of comfort will improve ?Outcome: Progressing ?  ?Problem: Safety: ?Goal: Ability to remain free from injury will improve ?Outcome: Progressing ?  ?Problem: Skin Integrity: ?Goal: Risk for impaired skin integrity will decrease ?Outcome: Progressing ?  ?Problem: Education: ?Goal: Required Educational Video(s) ?Outcome: Progressing ?  ?Problem: Clinical Measurements: ?Goal: Ability to maintain clinical measurements within normal limits will improve ?Outcome: Progressing ?Goal: Postoperative complications will be avoided or minimized ?Outcome: Progressing ?  ?
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
## 2021-06-24 NOTE — Progress Notes (Addendum)
Mobility Specialist Criteria Algorithm Info.   06/24/21 0945  Mobility  Activity Ambulated in hall;Dangled on edge of bed  Range of Motion/Exercises Active;All extremities  Level of Assistance Standby assist, set-up cues, supervision of patient - no hands on  RUE Weight Bearing WBAT  LUE Weight Bearing WBAT  RLE Weight Bearing WBAT  LLE Weight Bearing WBAT  Distance Ambulated (ft) 550 ft  Mobility Ambulated with assistance in hallway  Mobility Response Tolerated well  Mobility performed by Mobility specialist  Bed Position Semi-fowlers   Patient received dangling EOB. Slightly agitated and not engaging. Ambulated in hallway supervision level with steady gait. Tolerated ambulation well without complaint or incident. Was left dangling EOB with all needs met.    06/24/2021 09:45 AM

## 2021-06-25 LAB — GLUCOSE, CAPILLARY
Glucose-Capillary: 97 mg/dL (ref 70–99)
Glucose-Capillary: 93 mg/dL (ref 70–99)
Glucose-Capillary: 135 mg/dL — ABNORMAL HIGH (ref 70–99)
Glucose-Capillary: 113 mg/dL — ABNORMAL HIGH (ref 70–99)
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
## 2021-06-25 NOTE — TOC Progression Note (Signed)
Transition of Care [facility]) - Progression Note    Patient Details  Name: Jerome [name] MRN: [id number] Date of Birth: 07/20/1960  Transition of Care [facility]) CM/SW Contact  Janae Bridgeman, RN Phone Number: 06/25/2021, 11:41 AM  Clinical Narrative:    CM and MSW with DTP Team continue to follow the patient for safe disposition needs arranged through [facility] and DSS guardian through [facility].  I spoke with Macario Golds, MSW [facility] Supervisor and patient is waiting for establishment of Medicaid for payor source and safe disposition to care coordinated through [facility].  I called and left a message with [facility] [phone] to place referral and inquire if patient would be a candidate for placement and TBI program at the facility.  Patient is not appropriate placement at [facility] skilled nursing facility due to his TBI and psychiatric needs.   Expected Discharge Plan: Skilled Nursing Facility Barriers to Discharge: Homeless with medical needs, Inadequate or no insurance, Unsafe home situation  Expected Discharge Plan and Services Expected Discharge Plan: Skilled Nursing Facility   Discharge Planning Services: CM Consult   Living arrangements for the past 2 months: Homeless Shelter, Homeless                                       Social Determinants of Health (SDOH) Interventions    Readmission Risk Interventions No flowsheet data found.

## 2021-06-26 LAB — CBC WITH DIFFERENTIAL/PLATELET
Abs Immature Granulocytes: 0.02 10*3/uL (ref 0.00–0.07)
Basophils Absolute: 0 10*3/uL (ref 0.0–0.1)
Basophils Relative: 1 %
Eosinophils Absolute: 0.2 10*3/uL (ref 0.0–0.5)
Eosinophils Relative: 2 %
HCT: 41 % (ref 39.0–52.0)
Hemoglobin: 14 g/dL (ref 13.0–17.0)
Immature Granulocytes: 0 %
Lymphocytes Relative: 46 %
Lymphs Abs: 3.3 10*3/uL (ref 0.7–4.0)
MCH: 30 pg (ref 26.0–34.0)
MCHC: 34.1 g/dL (ref 30.0–36.0)
MCV: 88 fL (ref 80.0–100.0)
Monocytes Absolute: 0.5 10*3/uL (ref 0.1–1.0)
Monocytes Relative: 7 %
Neutro Abs: 3 10*3/uL (ref 1.7–7.7)
Neutrophils Relative %: 44 %
Platelets: 200 10*3/uL (ref 150–400)
RBC: 4.66 MIL/uL (ref 4.22–5.81)
RDW: 13.7 % (ref 11.5–15.5)
WBC: 7 10*3/uL (ref 4.0–10.5)
nRBC: 0 % (ref 0.0–0.2)

## 2021-06-26 LAB — COMPREHENSIVE METABOLIC PANEL
ALT: 24 U/L (ref 0–44)
AST: 27 U/L (ref 15–41)
Albumin: 3.2 g/dL — ABNORMAL LOW (ref 3.5–5.0)
Alkaline Phosphatase: 143 U/L — ABNORMAL HIGH (ref 38–126)
Anion gap: 10 (ref 5–15)
BUN: 15 mg/dL (ref 6–20)
CO2: 27 mmol/L (ref 22–32)
Calcium: 9.3 mg/dL (ref 8.9–10.3)
Chloride: 99 mmol/L (ref 98–111)
Creatinine, Ser: 0.85 mg/dL (ref 0.61–1.24)
GFR, Estimated: >60 mL/min (ref >60)
Glucose, Bld: 155 mg/dL — ABNORMAL HIGH (ref 70–99)
Potassium: 3.7 mmol/L (ref 3.5–5.1)
Sodium: 136 mmol/L (ref 135–145)
Total Bilirubin: 0.5 mg/dL (ref 0.3–1.2)
Total Protein: 7.7 g/dL (ref 6.5–8.1)

## 2021-06-26 LAB — GLUCOSE, CAPILLARY
Glucose-Capillary: 135 mg/dL — ABNORMAL HIGH (ref 70–99)
Glucose-Capillary: 116 mg/dL — ABNORMAL HIGH (ref 70–99)
Glucose-Capillary: 145 mg/dL — ABNORMAL HIGH (ref 70–99)
Glucose-Capillary: 136 mg/dL — ABNORMAL HIGH (ref 70–99)

## 2021-06-26 LAB — VALPROIC ACID LEVEL: Valproic Acid Lvl: 37 ug/mL — ABNORMAL LOW (ref 50.0–100.0)

## 2021-06-26 MED ORDER — DIVALPROEX SODIUM 125 MG PO CSDR
1000.0000 mg | DELAYED_RELEASE_CAPSULE | Freq: Two times a day (BID) | ORAL | Status: DC
  Administered 2021-06-26 – 2021-07-30 (×68): 1000 mg via ORAL
  Filled 2021-06-26 (×69): qty 8
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
## 2021-06-26 NOTE — Plan of Care (Signed)
?  Problem: Education: ?Goal: Knowledge of General Education information will improve ?Description: Including pain rating scale, medication(s)/side effects and non-pharmacologic comfort measures ?Outcome: Progressing ?  ?Problem: Health Behavior/Discharge Planning: ?Goal: Ability to manage health-related needs will improve ?Outcome: Progressing ?  ?Problem: Clinical Measurements: ?Goal: Ability to maintain clinical measurements within normal limits will improve ?Outcome: Progressing ?Goal: Will remain free from infection ?Outcome: Progressing ?Goal: Diagnostic test results will improve ?Outcome: Progressing ?Goal: Respiratory complications will improve ?Outcome: Progressing ?Goal: Cardiovascular complication will be avoided ?Outcome: Progressing ?  ?Problem: Activity: ?Goal: Risk for activity intolerance will decrease ?Outcome: Progressing ?  ?Problem: Nutrition: ?Goal: Adequate nutrition will be maintained ?Outcome: Progressing ?  ?Problem: Coping: ?Goal: Level of anxiety will decrease ?Outcome: Progressing ?  ?Problem: Elimination: ?Goal: Will not experience complications related to bowel motility ?Outcome: Progressing ?Goal: Will not experience complications related to urinary retention ?Outcome: Progressing ?  ?Problem: Pain Managment: ?Goal: General experience of comfort will improve ?Outcome: Progressing ?  ?Problem: Safety: ?Goal: Ability to remain free from injury will improve ?Outcome: Progressing ?  ?Problem: Skin Integrity: ?Goal: Risk for impaired skin integrity will decrease ?Outcome: Progressing ?  ?Problem: Education: ?Goal: Required Educational Video(s) ?Outcome: Progressing ?  ?Problem: Clinical Measurements: ?Goal: Ability to maintain clinical measurements within normal limits will improve ?Outcome: Progressing ?Goal: Postoperative complications will be avoided or minimized ?Outcome: Progressing ?  ?

## 2021-06-27 LAB — GLUCOSE, CAPILLARY
Glucose-Capillary: 101 mg/dL — ABNORMAL HIGH (ref 70–99)
Glucose-Capillary: 107 mg/dL — ABNORMAL HIGH (ref 70–99)
Glucose-Capillary: 170 mg/dL — ABNORMAL HIGH (ref 70–99)
Glucose-Capillary: 92 mg/dL (ref 70–99)

## 2021-06-27 NOTE — Plan of Care (Signed)
°  Problem: Education: Goal: Knowledge of General Education information will improve Description: Including pain rating scale, medication(s)/side effects and non-pharmacologic comfort measures Outcome: Progressing   Problem: Clinical Measurements: Goal: Ability to maintain clinical measurements within normal limits will improve Outcome: Progressing Goal: Will remain free from infection Outcome: Progressing Goal: Respiratory complications will improve Outcome: Progressing   Problem: Activity: Goal: Risk for activity intolerance will decrease Outcome: Progressing   Problem: Nutrition: Goal: Adequate nutrition will be maintained Outcome: Progressing   Problem: Coping: Goal: Level of anxiety will decrease Outcome: Progressing   Problem: Elimination: Goal: Will not experience complications related to bowel motility Outcome: Progressing Goal: Will not experience complications related to urinary retention Outcome: Progressing
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
## 2021-06-27 NOTE — Plan of Care (Signed)
°  Problem: Education: °Goal: Knowledge of General Education information will improve °Description: Including pain rating scale, medication(s)/side effects and non-pharmacologic comfort measures °Outcome: Adequate for Discharge °  °Problem: Health Behavior/Discharge Planning: °Goal: Ability to manage health-related needs will improve °Outcome: Adequate for Discharge °  °Problem: Clinical Measurements: °Goal: Ability to maintain clinical measurements within normal limits will improve °Outcome: Adequate for Discharge °Goal: Will remain free from infection °Outcome: Adequate for Discharge °Goal: Diagnostic test results will improve °Outcome: Adequate for Discharge °Goal: Respiratory complications will improve °Outcome: Adequate for Discharge °Goal: Cardiovascular complication will be avoided °Outcome: Adequate for Discharge °  °Problem: Activity: °Goal: Risk for activity intolerance will decrease °Outcome: Adequate for Discharge °  °Problem: Nutrition: °Goal: Adequate nutrition will be maintained °Outcome: Adequate for Discharge °  °Problem: Coping: °Goal: Level of anxiety will decrease °Outcome: Adequate for Discharge °  °Problem: Elimination: °Goal: Will not experience complications related to bowel motility °Outcome: Adequate for Discharge °Goal: Will not experience complications related to urinary retention °Outcome: Adequate for Discharge °  °Problem: Pain Managment: °Goal: General experience of comfort will improve °Outcome: Adequate for Discharge °  °Problem: Safety: °Goal: Ability to remain free from injury will improve °Outcome: Adequate for Discharge °  °Problem: Skin Integrity: °Goal: Risk for impaired skin integrity will decrease °Outcome: Adequate for Discharge °  °Problem: Education: °Goal: Required Educational Video(s) °Outcome: Adequate for Discharge °  °Problem: Clinical Measurements: °Goal: Ability to maintain clinical measurements within normal limits will improve °Outcome: Adequate for  Discharge °Goal: Postoperative complications will be avoided or minimized °Outcome: Adequate for Discharge °  °

## 2021-06-28 LAB — GLUCOSE, CAPILLARY
Glucose-Capillary: 88 mg/dL (ref 70–99)
Glucose-Capillary: 126 mg/dL — ABNORMAL HIGH (ref 70–99)
Glucose-Capillary: 108 mg/dL — ABNORMAL HIGH (ref 70–99)
Glucose-Capillary: 112 mg/dL — ABNORMAL HIGH (ref 70–99)
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
## 2021-06-28 NOTE — Plan of Care (Signed)
°  Problem: Education: °Goal: Knowledge of General Education information will improve °Description: Including pain rating scale, medication(s)/side effects and non-pharmacologic comfort measures °Outcome: Adequate for Discharge °  °Problem: Health Behavior/Discharge Planning: °Goal: Ability to manage health-related needs will improve °Outcome: Adequate for Discharge °  °Problem: Clinical Measurements: °Goal: Ability to maintain clinical measurements within normal limits will improve °Outcome: Adequate for Discharge °Goal: Will remain free from infection °Outcome: Adequate for Discharge °Goal: Diagnostic test results will improve °Outcome: Adequate for Discharge °Goal: Respiratory complications will improve °Outcome: Adequate for Discharge °Goal: Cardiovascular complication will be avoided °Outcome: Adequate for Discharge °  °Problem: Activity: °Goal: Risk for activity intolerance will decrease °Outcome: Adequate for Discharge °  °Problem: Nutrition: °Goal: Adequate nutrition will be maintained °Outcome: Adequate for Discharge °  °Problem: Coping: °Goal: Level of anxiety will decrease °Outcome: Adequate for Discharge °  °Problem: Elimination: °Goal: Will not experience complications related to bowel motility °Outcome: Adequate for Discharge °Goal: Will not experience complications related to urinary retention °Outcome: Adequate for Discharge °  °Problem: Pain Managment: °Goal: General experience of comfort will improve °Outcome: Adequate for Discharge °  °Problem: Safety: °Goal: Ability to remain free from injury will improve °Outcome: Adequate for Discharge °  °Problem: Skin Integrity: °Goal: Risk for impaired skin integrity will decrease °Outcome: Adequate for Discharge °  °Problem: Education: °Goal: Required Educational Video(s) °Outcome: Adequate for Discharge °  °Problem: Clinical Measurements: °Goal: Ability to maintain clinical measurements within normal limits will improve °Outcome: Adequate for  Discharge °Goal: Postoperative complications will be avoided or minimized °Outcome: Adequate for Discharge °  °

## 2021-06-29 LAB — GLUCOSE, CAPILLARY
Glucose-Capillary: 136 mg/dL — ABNORMAL HIGH (ref 70–99)
Glucose-Capillary: 90 mg/dL (ref 70–99)
Glucose-Capillary: 112 mg/dL — ABNORMAL HIGH (ref 70–99)

## 2021-06-29 NOTE — TOC Progression Note (Signed)
Transition of Care [facility]) - Progression Note    Patient Details  Name: Jerome [name] MRN: [id number] Date of Birth: 1960-09-22  Transition of Care [facility]) CM/SW Contact  Janae Bridgeman, RN Phone Number: 06/29/2021, 1:58 PM  Clinical Narrative:    CM called and left a voicemail message with [facility] at [phone] regarding referral to the facility.  I will follow up and send clinicals as appropriate if the facility is agreeable to review the patient for possible placement.  The patient is waiting for establishment of Medicaid through DSS.  The patient is being followed by [facility] for placement coordination relating to is S/P TBI.  DSS is now current guardian for the patient since the patient has no family and lacks capacity for decision-making.   Expected Discharge Plan: Skilled Nursing Facility Barriers to Discharge: Homeless with medical needs, Inadequate or no insurance, Unsafe home situation  Expected Discharge Plan and Services Expected Discharge Plan: Skilled Nursing Facility   Discharge Planning Services: CM Consult   Living arrangements for the past 2 months: Homeless Shelter, Homeless                                       Social Determinants of Health (SDOH) Interventions    Readmission Risk Interventions No flowsheet data found.

## 2021-06-29 NOTE — Progress Notes (Signed)
Mobility Specialist Criteria Algorithm Info.    06/29/21 0915  Mobility  Activity Ambulated in hall;Ambulated to bathroom;Dangled on edge of bed  Range of Motion/Exercises Active;All extremities  Level of Assistance Standby assist, set-up cues, supervision of patient - no hands on  Assistive Device Front wheel walker  RUE Weight Bearing WBAT  LUE Weight Bearing WBAT  RLE Weight Bearing WBAT  LLE Weight Bearing WBAT  Distance Ambulated (ft) 550 ft  Mobility Ambulated with assistance in hallway;Out of bed for toileting  Mobility Response Tolerated well  Mobility performed by Mobility specialist  Bed Position Semi-fowlers   Patient ambulated to bathroom then in hallway with steady gait. Tolerated ambulation well without complaint or incident. Was left dangling EOB  with all needs met.    06/29/2021 11:56 AM

## 2021-06-29 NOTE — Plan of Care (Signed)
?  Problem: Education: ?Goal: Knowledge of General Education information will improve ?Description: Including pain rating scale, medication(s)/side effects and non-pharmacologic comfort measures ?Outcome: Progressing ?  ?Problem: Health Behavior/Discharge Planning: ?Goal: Ability to manage health-related needs will improve ?Outcome: Progressing ?  ?Problem: Clinical Measurements: ?Goal: Ability to maintain clinical measurements within normal limits will improve ?Outcome: Progressing ?Goal: Will remain free from infection ?Outcome: Progressing ?Goal: Diagnostic test results will improve ?Outcome: Progressing ?Goal: Respiratory complications will improve ?Outcome: Progressing ?Goal: Cardiovascular complication will be avoided ?Outcome: Progressing ?  ?Problem: Activity: ?Goal: Risk for activity intolerance will decrease ?Outcome: Progressing ?  ?Problem: Nutrition: ?Goal: Adequate nutrition will be maintained ?Outcome: Progressing ?  ?Problem: Coping: ?Goal: Level of anxiety will decrease ?Outcome: Progressing ?  ?Problem: Elimination: ?Goal: Will not experience complications related to bowel motility ?Outcome: Progressing ?Goal: Will not experience complications related to urinary retention ?Outcome: Progressing ?  ?Problem: Pain Managment: ?Goal: General experience of comfort will improve ?Outcome: Progressing ?  ?Problem: Safety: ?Goal: Ability to remain free from injury will improve ?Outcome: Progressing ?  ?Problem: Skin Integrity: ?Goal: Risk for impaired skin integrity will decrease ?Outcome: Progressing ?  ?Problem: Education: ?Goal: Required Educational Video(s) ?Outcome: Progressing ?  ?Problem: Clinical Measurements: ?Goal: Ability to maintain clinical measurements within normal limits will improve ?Outcome: Progressing ?Goal: Postoperative complications will be avoided or minimized ?Outcome: Progressing ?  ?
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
## 2021-06-30 LAB — GLUCOSE, CAPILLARY
Glucose-Capillary: 181 mg/dL — ABNORMAL HIGH (ref 70–99)
Glucose-Capillary: 103 mg/dL — ABNORMAL HIGH (ref 70–99)
Glucose-Capillary: 109 mg/dL — ABNORMAL HIGH (ref 70–99)
Glucose-Capillary: 112 mg/dL — ABNORMAL HIGH (ref 70–99)

## 2021-06-30 NOTE — TOC Progression Note (Addendum)
Transition of Care [facility]) - Progression Note    Patient Details  Name: Kaylin Kurtis MRN: [id number] Date of Birth: 08-20-1960  [facility]) CM/SW Contact  Curlene Labrum, RN Phone Number: 06/30/2021, 11:41 AM  Clinical Narrative:    CM received a voicemail and secure email to follow up with Purvis Sheffield, Regional Manager at [facility].  I called her back and left a message on her phone to discuss the patient's case and need for placement for care.  The patient has no insurance at this time and has pending Medicaid application and guardianship through [employer].  The patient is also followed by [facility] for placement coordination due to his recent TBI.  CM and MSW with DTP Team continue to follow the patient for needed placement.  06/30/2021 1216 - CM spoke with Purvis Sheffield, Regional manager for [facility] and they are unable to provide services to the patient since the facility does not accept patient's with Medicaid pending or Medicaid in place for insurance.  CM will continue to follow the patient for placement.   Expected Discharge Plan: [place] Barriers to Discharge: Homeless with medical needs, Inadequate or no insurance, Unsafe home situation  Expected Discharge Plan and Services Expected Discharge Plan: [place]   Discharge Planning Services: CM Consult   Living arrangements for the past 2 months: Homeless Shelter, Homeless                                       Social Determinants of Health (SDOH) Interventions    Readmission Risk Interventions No flowsheet data found.
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
## 2021-07-01 LAB — GLUCOSE, CAPILLARY
Glucose-Capillary: 123 mg/dL — ABNORMAL HIGH (ref 70–99)
Glucose-Capillary: 104 mg/dL — ABNORMAL HIGH (ref 70–99)
Glucose-Capillary: 146 mg/dL — ABNORMAL HIGH (ref 70–99)
Glucose-Capillary: 141 mg/dL — ABNORMAL HIGH (ref 70–99)

## 2021-07-01 NOTE — Plan of Care (Signed)
  Problem: Health Behavior/Discharge Planning: Goal: Ability to manage health-related needs will improve Outcome: Progressing   Problem: Clinical Measurements: Goal: Ability to maintain clinical measurements within normal limits will improve Outcome: Progressing Goal: Will remain free from infection Outcome: Progressing   
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
## 2021-07-02 LAB — CBC
HCT: 40.1 % (ref 39.0–52.0)
Hemoglobin: 13.3 g/dL (ref 13.0–17.0)
MCH: 29.4 pg (ref 26.0–34.0)
MCHC: 33.2 g/dL (ref 30.0–36.0)
MCV: 88.7 fL (ref 80.0–100.0)
Platelets: 224 10*3/uL (ref 150–400)
RBC: 4.52 MIL/uL (ref 4.22–5.81)
RDW: 13.8 % (ref 11.5–15.5)
WBC: 6.7 10*3/uL (ref 4.0–10.5)
nRBC: 0 % (ref 0.0–0.2)

## 2021-07-02 LAB — BASIC METABOLIC PANEL
Anion gap: 8 (ref 5–15)
BUN: 11 mg/dL (ref 6–20)
CO2: 28 mmol/L (ref 22–32)
Calcium: 9 mg/dL (ref 8.9–10.3)
Chloride: 101 mmol/L (ref 98–111)
Creatinine, Ser: 0.74 mg/dL (ref 0.61–1.24)
GFR, Estimated: >60 mL/min (ref >60)
Glucose, Bld: 131 mg/dL — ABNORMAL HIGH (ref 70–99)
Potassium: 3.7 mmol/L (ref 3.5–5.1)
Sodium: 137 mmol/L (ref 135–145)

## 2021-07-02 LAB — GLUCOSE, CAPILLARY
Glucose-Capillary: 99 mg/dL (ref 70–99)
Glucose-Capillary: 154 mg/dL — ABNORMAL HIGH (ref 70–99)
Glucose-Capillary: 107 mg/dL — ABNORMAL HIGH (ref 70–99)

## 2021-07-02 NOTE — Progress Notes (Signed)
Mobility Specialist Criteria Algorithm Info.    07/02/21 0945  Mobility  Activity Ambulated in hall;Dangled on edge of bed  Range of Motion/Exercises Active;All extremities  Level of Assistance Standby assist, set-up cues, supervision of patient - no hands on  Assistive Device Front wheel walker  RUE Weight Bearing WBAT  LUE Weight Bearing WBAT  RLE Weight Bearing WBAT  LLE Weight Bearing WBAT  Distance Ambulated (ft) 550 ft  Mobility Ambulated with assistance in hallway  Mobility Response Tolerated well  Mobility performed by Mobility specialist  Bed Position Semi-fowlers   Patient ambulated in hallway supervision level with steady gait. Tolerated ambulation well without complaint or incident. Was left dangling EOB with all needs met.   07/02/2021 09:45 AM

## 2021-07-02 NOTE — Progress Notes (Signed)
SLP Cancellation Note  Patient Details Name: Jerome [name] MRN: [id number] DOB: 1961-05-27   Cancelled treatment:       Reason Eval/Treat Not Completed: Patient declined, no reason specified  Upon arrival of SLP, pt was lying in bed with eyes closed. He opened his eyes when I said his name. He said something unintelligible, then covered his eyes with his arm and refused to say anything else. SLP encouraged pt to participate, explaining purpose of treatment and current goals, to no avail. Will continue efforts.  Jerome [name], [facility], CCC-SLP Speech Language Pathologist Office: [phone]  Leigh Aurora 07/02/2021, 2:06 PM

## 2021-07-02 NOTE — Plan of Care (Signed)
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
## 2021-07-03 DIAGNOSIS — R079 Chest pain, unspecified: Secondary | ICD-10-CM

## 2021-07-03 LAB — GLUCOSE, CAPILLARY
Glucose-Capillary: 173 mg/dL — ABNORMAL HIGH (ref 70–99)
Glucose-Capillary: 153 mg/dL — ABNORMAL HIGH (ref 70–99)
Glucose-Capillary: 88 mg/dL (ref 70–99)
Glucose-Capillary: 171 mg/dL — ABNORMAL HIGH (ref 70–99)

## 2021-07-03 LAB — TROPONIN I (HIGH SENSITIVITY): Troponin I (High Sensitivity): 4 ng/L (ref <18)

## 2021-07-03 MED ORDER — PANTOPRAZOLE SODIUM 40 MG PO TBEC
40.0000 mg | DELAYED_RELEASE_TABLET | Freq: Every day | ORAL | Status: DC
  Administered 2021-07-03 – 2021-07-12 (×10): 40 mg via ORAL
  Filled 2021-07-03 (×9): qty 1

## 2021-07-03 MED ORDER — NITROGLYCERIN 0.4 MG SL SUBL: 0.4000 mg | SUBLINGUAL_TABLET | SUBLINGUAL | Status: DC | PRN

## 2021-07-03 MED ORDER — ALUM & MAG HYDROXIDE-SIMETH 200-200-20 MG/5ML PO SUSP: 30.0000 mL | Freq: Four times a day (QID) | ORAL | Status: DC | PRN

## 2021-07-03 NOTE — Plan of Care (Signed)
°  Problem: Health Behavior/Discharge Planning: Goal: Ability to manage health-related needs will improve Outcome: Progressing   Problem: Clinical Measurements: Goal: Ability to maintain clinical measurements within normal limits will improve Outcome: Progressing Goal: Will remain free from infection Outcome: Progressing Goal: Diagnostic test results will improve Outcome: Progressing Goal: Respiratory complications will improve Outcome: Progressing Goal: Cardiovascular complication will be avoided Outcome: Progressing   Problem: Clinical Measurements: Goal: Will remain free from infection Outcome: Progressing   Problem: Clinical Measurements: Goal: Diagnostic test results will improve Outcome: Progressing   Problem: Clinical Measurements: Goal: Respiratory complications will improve Outcome: Progressing   Problem: Clinical Measurements: Goal: Cardiovascular complication will be avoided Outcome: Progressing   Problem: Nutrition: Goal: Adequate nutrition will be maintained Outcome: Progressing   Problem: Coping: Goal: Level of anxiety will decrease Outcome: Progressing   Problem: Elimination: Goal: Will not experience complications related to bowel motility Outcome: Progressing Goal: Will not experience complications related to urinary retention Outcome: Progressing   Problem: Safety: Goal: Ability to remain free from injury will improve Outcome: Progressing   Problem: Pain Managment: Goal: General experience of comfort will improve Outcome: Progressing   Problem: Education: Goal: Required Educational Video(s) Outcome: Progressing
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
## 2021-07-04 LAB — GLUCOSE, CAPILLARY
Glucose-Capillary: 84 mg/dL (ref 70–99)
Glucose-Capillary: 210 mg/dL — ABNORMAL HIGH (ref 70–99)
Glucose-Capillary: 143 mg/dL — ABNORMAL HIGH (ref 70–99)
Glucose-Capillary: 99 mg/dL (ref 70–99)
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
## 2021-07-05 LAB — GLUCOSE, CAPILLARY
Glucose-Capillary: 180 mg/dL — ABNORMAL HIGH (ref 70–99)
Glucose-Capillary: 158 mg/dL — ABNORMAL HIGH (ref 70–99)
Glucose-Capillary: 113 mg/dL — ABNORMAL HIGH (ref 70–99)
Glucose-Capillary: 124 mg/dL — ABNORMAL HIGH (ref 70–99)
Glucose-Capillary: 155 mg/dL — ABNORMAL HIGH (ref 70–99)

## 2021-07-05 MED ORDER — ACETAMINOPHEN 325 MG PO TABS
650.0000 mg | ORAL_TABLET | ORAL | Status: DC | PRN
  Administered 2021-07-08 – 2021-07-16 (×2): 650 mg via ORAL
  Filled 2021-07-05: qty 2

## 2021-07-05 MED ORDER — METHOCARBAMOL 500 MG PO TABS
500.0000 mg | ORAL_TABLET | Freq: Three times a day (TID) | ORAL | Status: DC | PRN
  Administered 2021-07-10 – 2021-07-16 (×3): 500 mg via ORAL
  Filled 2021-07-05 (×3): qty 1
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
## 2021-07-05 NOTE — Progress Notes (Signed)
CSW attempted to reach patient's care coordinator Jamiee without success - a voicemail was left requesting a return call.  CSW spoke with Sander Radon who states she is working with a Financial controller at the [employer] to apply for Disability on the patient's behalf. No Medicaid application has been submitted as of yet. Herbert Seta states the patient will require ALF at discharge.  Edwin Dada, MSW, LCSW Transitions of Care   Clinical Social Worker II [phone]

## 2021-07-05 NOTE — Progress Notes (Signed)
OT Cancellation Note  Patient Details Name: Jerome [name] MRN: [id number] DOB: 11-Jul-1960   Cancelled Treatment:    Reason Eval/Treat Not Completed: Patient declined, no reason specified (another attempt was made to have patient participate with OT. Patient was in bed and stated his leg was hurting and did not want to get up.  Patient would not open eyes for therapist.) Alfonse Flavors, OTA Acute Rehabilitation Services  Pager [phone] Office [phone]  Dewain Penning 07/05/2021, 2:49 PM

## 2021-07-05 NOTE — Progress Notes (Signed)
OT Cancellation Note  Patient Details Name: Jerome [name] MRN: [id number] DOB: Oct 14, 1960   Cancelled Treatment:    Reason Eval/Treat Not Completed: Patient declined, no reason specified (Patient was in bed and would not respond to therapist. Patient addressed again and patient began cursing at therapist and telling him to leave.  OT to attempt again later today if time allows.) Alfonse Flavors, OTA Acute Rehabilitation Services  Pager [phone] Office [phone]  Dewain Penning 07/05/2021, 11:39 AM

## 2021-07-06 LAB — GLUCOSE, CAPILLARY
Glucose-Capillary: 71 mg/dL (ref 70–99)
Glucose-Capillary: 177 mg/dL — ABNORMAL HIGH (ref 70–99)
Glucose-Capillary: 79 mg/dL (ref 70–99)
Glucose-Capillary: 157 mg/dL — ABNORMAL HIGH (ref 70–99)
Glucose-Capillary: 128 mg/dL — ABNORMAL HIGH (ref 70–99)

## 2021-07-06 NOTE — Plan of Care (Signed)
  Problem: Education: Goal: Knowledge of General Education information will improve Description: Including pain rating scale, medication(s)/side effects and non-pharmacologic comfort measures Outcome: Progressing   Problem: Health Behavior/Discharge Planning: Goal: Ability to manage health-related needs will improve Outcome: Progressing   Problem: Clinical Measurements: Goal: Will remain free from infection Outcome: Progressing   Problem: Activity: Goal: Risk for activity intolerance will decrease Outcome: Progressing   Problem: Nutrition: Goal: Adequate nutrition will be maintained Outcome: Progressing   Problem: Coping: Goal: Level of anxiety will decrease Outcome: Progressing   Problem: Elimination: Goal: Will not experience complications related to bowel motility Outcome: Progressing   Problem: Safety: Goal: Ability to remain free from injury will improve Outcome: Progressing   
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
## 2021-07-07 LAB — GLUCOSE, CAPILLARY
Glucose-Capillary: 101 mg/dL — ABNORMAL HIGH (ref 70–99)
Glucose-Capillary: 73 mg/dL (ref 70–99)

## 2021-07-07 NOTE — Plan of Care (Signed)

## 2021-07-07 NOTE — Plan of Care (Signed)

## 2021-07-07 NOTE — Progress Notes (Signed)
CSW spoke with Britta Mccreedy at [facility] who states she does not have any beds at this time but will return call to CSW once a Medicaid bed becomes available.  Edwin Dada, MSW, LCSW Transitions of Care   Clinical Social Worker II [phone]
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
## 2021-07-08 NOTE — Plan of Care (Signed)
  Problem: Education: Goal: Knowledge of General Education information will improve Description Including pain rating scale, medication(s)/side effects and non-pharmacologic comfort measures Outcome: Progressing   Problem: Activity: Goal: Risk for activity intolerance will decrease Outcome: Progressing   Problem: Safety: Goal: Ability to remain free from injury will improve Outcome: Progressing   

## 2021-07-08 NOTE — Plan of Care (Signed)
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
## 2021-07-08 NOTE — Progress Notes (Signed)
Mobility Specialist Criteria Algorithm Info.   07/08/21 1245  Mobility  Activity Ambulated in hall  Range of Motion/Exercises Active;All extremities  Level of Assistance Standby assist, set-up cues, supervision of patient - no hands on  Assistive Device Front wheel walker  RUE Weight Bearing WBAT  LUE Weight Bearing WBAT  RLE Weight Bearing WBAT  LLE Weight Bearing WBAT  Distance Ambulated (ft) 550 ft  Mobility Ambulated with assistance in hallway  Mobility Response Tolerated well  Mobility performed by Mobility specialist  Bed Position Semi-fowlers   Patient ambulated in hallway supervision level with steady gait. Tolerated ambulation well without complaint or incident. Was left lying supine in bed with all needs met.   07/08/2021 4:25 PM

## 2021-07-09 LAB — BASIC METABOLIC PANEL
Anion gap: 8 (ref 5–15)
BUN: 11 mg/dL (ref 6–20)
CO2: 30 mmol/L (ref 22–32)
Calcium: 9.7 mg/dL (ref 8.9–10.3)
Chloride: 102 mmol/L (ref 98–111)
Creatinine, Ser: 0.88 mg/dL (ref 0.61–1.24)
GFR, Estimated: >60 mL/min (ref >60)
Glucose, Bld: 101 mg/dL — ABNORMAL HIGH (ref 70–99)
Potassium: 4 mmol/L (ref 3.5–5.1)
Sodium: 140 mmol/L (ref 135–145)

## 2021-07-09 LAB — CBC
HCT: 43.7 % (ref 39.0–52.0)
Hemoglobin: 14.6 g/dL (ref 13.0–17.0)
MCH: 29.6 pg (ref 26.0–34.0)
MCHC: 33.4 g/dL (ref 30.0–36.0)
MCV: 88.5 fL (ref 80.0–100.0)
Platelets: 189 10*3/uL (ref 150–400)
RBC: 4.94 MIL/uL (ref 4.22–5.81)
RDW: 14 % (ref 11.5–15.5)
WBC: 6.3 10*3/uL (ref 4.0–10.5)
nRBC: 0 % (ref 0.0–0.2)

## 2021-07-09 LAB — GLUCOSE, CAPILLARY
Glucose-Capillary: 102 mg/dL — ABNORMAL HIGH (ref 70–99)
Glucose-Capillary: 163 mg/dL — ABNORMAL HIGH (ref 70–99)

## 2021-07-09 NOTE — Plan of Care (Signed)
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
## 2021-07-09 NOTE — Progress Notes (Signed)
CSW spoke with Beverly, owner of [facility] who states the facility does not have any beds at this time. ° °Roen Macgowan, MSW, LCSW °Transitions of Care   Clinical Social Worker II °[phone] ° °

## 2021-07-09 NOTE — Progress Notes (Signed)
Mobility Specialist Criteria Algorithm Info.    07/09/21 0930  Mobility  Activity Ambulated in hall;Dangled on edge of bed  Range of Motion/Exercises Active;All extremities  Level of Assistance Modified independent, requires aide device or extra time  Assistive Device Front wheel walker  RUE Weight Bearing WBAT  LUE Weight Bearing WBAT  RLE Weight Bearing WBAT  LLE Weight Bearing WBAT  Distance Ambulated (ft) 500 ft  Mobility Ambulated with assistance in hallway  Mobility Response Tolerated well  Mobility performed by Mobility specialist  Bed Position Semi-fowlers   Patient ambulated in hallway with steady gait. Tolerated ambulation well without complaint or incident. Was left lying supine in bed with all needs met.   07/09/2021 4:47 PM  [place] Inola Lisle, [place], [place]  EAKLT:[phone] Office: [phone]

## 2021-07-09 NOTE — Plan of Care (Signed)
°  Problem: Education: Goal: Knowledge of General Education information will improve Description: Including pain rating scale, medication(s)/side effects and non-pharmacologic comfort measures Outcome: Not Progressing   Problem: Health Behavior/Discharge Planning: Goal: Ability to manage health-related needs will improve Outcome: Not Progressing   Problem: Clinical Measurements: Goal: Ability to maintain clinical measurements within normal limits will improve Outcome: Not Progressing Goal: Will remain free from infection Outcome: Not Progressing Goal: Diagnostic test results will improve Outcome: Not Progressing Goal: Respiratory complications will improve Outcome: Not Progressing Goal: Cardiovascular complication will be avoided Outcome: Not Progressing   Problem: Clinical Measurements: Goal: Will remain free from infection Outcome: Not Progressing   Problem: Clinical Measurements: Goal: Diagnostic test results will improve Outcome: Not Progressing   Problem: Clinical Measurements: Goal: Respiratory complications will improve Outcome: Not Progressing   Problem: Clinical Measurements: Goal: Cardiovascular complication will be avoided Outcome: Not Progressing   Problem: Nutrition: Goal: Adequate nutrition will be maintained Outcome: Not Progressing   Problem: Elimination: Goal: Will not experience complications related to bowel motility Outcome: Not Progressing Goal: Will not experience complications related to urinary retention Outcome: Not Progressing   Problem: Clinical Measurements: Goal: Ability to maintain clinical measurements within normal limits will improve Outcome: Not Progressing Goal: Postoperative complications will be avoided or minimized Outcome: Not Progressing

## 2021-07-10 LAB — GLUCOSE, CAPILLARY
Glucose-Capillary: 108 mg/dL — ABNORMAL HIGH (ref 70–99)
Glucose-Capillary: 127 mg/dL — ABNORMAL HIGH (ref 70–99)
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
## 2021-07-10 NOTE — Progress Notes (Signed)
Mobility Specialist Criteria Algorithm Info.   07/10/21 0944  Mobility  Activity Ambulated in room;Ambulated to bathroom;Dangled on edge of bed (Declined hallway ambulation)  Range of Motion/Exercises Active;All extremities  Level of Assistance Modified independent, requires aide device or extra time  Assistive Device Front wheel walker  RUE Weight Bearing WBAT  LUE Weight Bearing WBAT  RLE Weight Bearing WBAT  LLE Weight Bearing WBAT  Distance Ambulated (ft) 30 ft  Mobility Ambulated with assistance in room;Out of bed for toileting  Mobility Response Tolerated well  Mobility performed by Mobility specialist  Bed Position Semi-fowlers   Patient declined hallway ambulation. Agreed to ambulate short distance in room and to bathroom. Ambulated mod I with steady gait. Tolerated ambulation well without complaint or incident. Was left lying supine in bed with all needs met.    07/10/2021 09:42 AM  Jerome [name], [place], [place]  QBHAL:[phone] Office: [phone]

## 2021-07-10 NOTE — Plan of Care (Signed)

## 2021-07-11 DIAGNOSIS — K219 Gastro-esophageal reflux disease without esophagitis: Secondary | ICD-10-CM

## 2021-07-11 LAB — GLUCOSE, CAPILLARY: Glucose-Capillary: 117 mg/dL — ABNORMAL HIGH (ref 70–99)

## 2021-07-11 MED ORDER — METFORMIN HCL 500 MG PO TABS
500.0000 mg | ORAL_TABLET | Freq: Two times a day (BID) | ORAL | Status: DC
  Administered 2021-07-14 – 2021-07-30 (×32): 500 mg via ORAL
  Filled 2021-07-11 (×33): qty 1
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
## 2021-07-11 NOTE — Progress Notes (Signed)
°   07/11/21 1245  Mobility  Activity Refused mobility (Declined for unspecified reasons)

## 2021-07-11 NOTE — Plan of Care (Signed)
°  Problem: Education: Goal: Knowledge of General Education information will improve Description: Including pain rating scale, medication(s)/side effects and non-pharmacologic comfort measures Outcome: Progressing   Problem: Health Behavior/Discharge Planning: Goal: Ability to manage health-related needs will improve Outcome: Progressing   Problem: Clinical Measurements: Goal: Ability to maintain clinical measurements within normal limits will improve Outcome: Progressing Goal: Will remain free from infection Outcome: Progressing   Problem: Activity: Goal: Risk for activity intolerance will decrease Outcome: Progressing   Problem: Nutrition: Goal: Adequate nutrition will be maintained Outcome: Progressing   Problem: Coping: Goal: Level of anxiety will decrease Outcome: Progressing   Problem: Elimination: Goal: Will not experience complications related to bowel motility Outcome: Progressing   Problem: Pain Managment: Goal: General experience of comfort will improve Outcome: Progressing   Problem: Skin Integrity: Goal: Risk for impaired skin integrity will decrease Outcome: Progressing

## 2021-07-12 LAB — MAGNESIUM: Magnesium: 1.6 mg/dL — ABNORMAL LOW (ref 1.7–2.4)

## 2021-07-12 LAB — CBC
HCT: 40.9 % (ref 39.0–52.0)
Hemoglobin: 14.1 g/dL (ref 13.0–17.0)
MCH: 30.2 pg (ref 26.0–34.0)
MCHC: 34.5 g/dL (ref 30.0–36.0)
MCV: 87.6 fL (ref 80.0–100.0)
Platelets: 150 10*3/uL (ref 150–400)
RBC: 4.67 MIL/uL (ref 4.22–5.81)
RDW: 14.2 % (ref 11.5–15.5)
WBC: 6.2 10*3/uL (ref 4.0–10.5)
nRBC: 0 % (ref 0.0–0.2)

## 2021-07-12 LAB — GLUCOSE, CAPILLARY: Glucose-Capillary: 116 mg/dL — ABNORMAL HIGH (ref 70–99)

## 2021-07-12 LAB — COMPREHENSIVE METABOLIC PANEL
ALT: 32 U/L (ref 0–44)
AST: 37 U/L (ref 15–41)
Albumin: 3.2 g/dL — ABNORMAL LOW (ref 3.5–5.0)
Alkaline Phosphatase: 170 U/L — ABNORMAL HIGH (ref 38–126)
Anion gap: 10 (ref 5–15)
BUN: 11 mg/dL (ref 6–20)
CO2: 25 mmol/L (ref 22–32)
Calcium: 9.3 mg/dL (ref 8.9–10.3)
Chloride: 103 mmol/L (ref 98–111)
Creatinine, Ser: 0.86 mg/dL (ref 0.61–1.24)
GFR, Estimated: >60 mL/min (ref >60)
Glucose, Bld: 161 mg/dL — ABNORMAL HIGH (ref 70–99)
Potassium: 3.8 mmol/L (ref 3.5–5.1)
Sodium: 138 mmol/L (ref 135–145)
Total Bilirubin: 0.6 mg/dL (ref 0.3–1.2)
Total Protein: 7.7 g/dL (ref 6.5–8.1)

## 2021-07-12 MED ORDER — FAMOTIDINE 20 MG PO TABS
40.0000 mg | ORAL_TABLET | Freq: Every day | ORAL | Status: DC
  Administered 2021-07-12 – 2021-07-30 (×18): 40 mg via ORAL
  Filled 2021-07-12 (×19): qty 2

## 2021-07-12 MED ORDER — MAGNESIUM SULFATE 2 GM/50ML IV SOLN
2.0000 g | Freq: Once | INTRAVENOUS | Status: AC
  Administered 2021-07-12: 2 g via INTRAVENOUS
  Filled 2021-07-12: qty 50
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
## 2021-07-13 LAB — GLUCOSE, CAPILLARY: Glucose-Capillary: 191 mg/dL — ABNORMAL HIGH (ref 70–99)

## 2021-07-13 LAB — MAGNESIUM: Magnesium: 1.8 mg/dL (ref 1.7–2.4)
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
## 2021-07-13 NOTE — Plan of Care (Signed)
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
## 2021-07-14 NOTE — Progress Notes (Signed)
CSW spoke with Sander Radon of [place] [facility] to discuss the discharge plan for patient. She is requesting 24 hours of additional time to determine if any support services can be arranged for patient. CSW updated medical team. CSW sent patient's recent PT note and psychiatry note from 06/16/21 to [facility] for review.  Edwin Dada, MSW, LCSW Transitions of Care   Clinical Social Worker II [phone]

## 2021-07-14 NOTE — Plan of Care (Signed)
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
## 2021-07-15 LAB — GLUCOSE, CAPILLARY: Glucose-Capillary: 157 mg/dL — ABNORMAL HIGH (ref 70–99)
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
## 2021-07-15 NOTE — Progress Notes (Signed)
°  Mobility Specialist Criteria Algorithm Info.   07/15/21 1350  Mobility  Bed Position Semi-fowlers  Activity Ambulated with assistance in hallway  Range of Motion/Exercises Active;All extremities  Level of Assistance Standby assist, set-up cues, supervision of patient - no hands on  Assistive Device Front wheel walker  RUE Weight Bearing WBAT  LUE Weight Bearing WBAT  RLE Weight Bearing WBAT  LLE Weight Bearing WBAT  Distance Ambulated (ft) 450 ft  Activity Response Tolerated well   Patient ambulated in hallway independently with steady gait. Tolerated ambulation well without complaint or incident.   07/15/2021 5:20 PM  [place] Rion Catala, CMS, BS EXP [facility] Services  [phone] Office: [phone]
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
## 2021-07-15 NOTE — Plan of Care (Signed)

## 2021-07-15 NOTE — Progress Notes (Signed)
CSW spoke with patient's legal guardian Sander Radon at [facility] regarding patient. Herbert Seta is not in agreement for patient to discharge to a shelter, hotel, or the streets. CSW informed NP and MD of information.  Edwin Dada, MSW, LCSW Transitions of Care   Clinical Social Worker II [phone]

## 2021-07-16 LAB — BASIC METABOLIC PANEL
Anion gap: 10 (ref 5–15)
BUN: 8 mg/dL (ref 6–20)
CO2: 26 mmol/L (ref 22–32)
Calcium: 9.1 mg/dL (ref 8.9–10.3)
Chloride: 103 mmol/L (ref 98–111)
Creatinine, Ser: 0.84 mg/dL (ref 0.61–1.24)
GFR, Estimated: >60 mL/min (ref >60)
Glucose, Bld: 196 mg/dL — ABNORMAL HIGH (ref 70–99)
Potassium: 3.8 mmol/L (ref 3.5–5.1)
Sodium: 139 mmol/L (ref 135–145)

## 2021-07-16 LAB — GLUCOSE, CAPILLARY
Glucose-Capillary: 112 mg/dL — ABNORMAL HIGH (ref 70–99)
Glucose-Capillary: 85 mg/dL (ref 70–99)
Glucose-Capillary: 143 mg/dL — ABNORMAL HIGH (ref 70–99)
Glucose-Capillary: 136 mg/dL — ABNORMAL HIGH (ref 70–99)

## 2021-07-16 LAB — CBC
HCT: 39.9 % (ref 39.0–52.0)
Hemoglobin: 13.6 g/dL (ref 13.0–17.0)
MCH: 29.9 pg (ref 26.0–34.0)
MCHC: 34.1 g/dL (ref 30.0–36.0)
MCV: 87.7 fL (ref 80.0–100.0)
Platelets: 190 10*3/uL (ref 150–400)
RBC: 4.55 MIL/uL (ref 4.22–5.81)
RDW: 14 % (ref 11.5–15.5)
WBC: 6.1 10*3/uL (ref 4.0–10.5)
nRBC: 0 % (ref 0.0–0.2)
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
## 2021-07-16 NOTE — Plan of Care (Signed)
  Problem: Health Behavior/Discharge Planning: Goal: Ability to manage health-related needs will improve Outcome: Progressing   Problem: Clinical Measurements: Goal: Ability to maintain clinical measurements within normal limits will improve Outcome: Progressing Goal: Diagnostic test results will improve Outcome: Progressing   

## 2021-07-16 NOTE — Progress Notes (Signed)
Mobility Specialist Criteria Algorithm Info.    07/16/21 1030  Mobility  Bed Position Semi-fowlers  Activity Ambulated with assistance in hallway  Range of Motion/Exercises Active;All extremities  Level of Assistance Modified independent, requires aide device or extra time  Assistive Device Front wheel walker  RUE Weight Bearing WBAT  LUE Weight Bearing WBAT  RLE Weight Bearing WBAT  LLE Weight Bearing WBAT  Distance Ambulated (ft) 480 ft  Activity Response Tolerated well   Patient ambulated in hallway independently with steady gait. Tolerated ambulation well without complaint or incident. Was left lying supine in bed with all needs met.   07/16/2021 10:30 AM  Jerome [name], [place], [place]  IXMDE:[phone] Office: [phone]

## 2021-07-16 NOTE — Plan of Care (Signed)

## 2021-07-17 DIAGNOSIS — T1490XA Injury, unspecified, initial encounter: Secondary | ICD-10-CM

## 2021-07-17 LAB — GLUCOSE, CAPILLARY
Glucose-Capillary: 139 mg/dL — ABNORMAL HIGH (ref 70–99)
Glucose-Capillary: 126 mg/dL — ABNORMAL HIGH (ref 70–99)

## 2021-07-17 MED ORDER — ROSUVASTATIN CALCIUM 5 MG PO TABS
10.0000 mg | ORAL_TABLET | Freq: Every day | ORAL | Status: DC
  Administered 2021-07-17 – 2021-07-30 (×14): 10 mg via ORAL
  Filled 2021-07-17 (×14): qty 2

## 2021-07-17 NOTE — Plan of Care (Signed)
  Problem: Health Behavior/Discharge Planning: Goal: Ability to manage health-related needs will improve Outcome: Progressing   Problem: Clinical Measurements: Goal: Ability to maintain clinical measurements within normal limits will improve Outcome: Progressing Goal: Will remain free from infection Outcome: Progressing   

## 2021-07-17 NOTE — Plan of Care (Signed)
  Problem: Education: Goal: Knowledge of General Education information will improve Description Including pain rating scale, medication(s)/side effects and non-pharmacologic comfort measures Outcome: Progressing   Problem: Health Behavior/Discharge Planning: Goal: Ability to manage health-related needs will improve Outcome: Progressing   
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
## 2021-07-18 LAB — COMPREHENSIVE METABOLIC PANEL
ALT: 31 U/L (ref 0–44)
AST: 32 U/L (ref 15–41)
Albumin: 3.3 g/dL — ABNORMAL LOW (ref 3.5–5.0)
Alkaline Phosphatase: 148 U/L — ABNORMAL HIGH (ref 38–126)
Anion gap: 9 (ref 5–15)
BUN: 10 mg/dL (ref 6–20)
CO2: 27 mmol/L (ref 22–32)
Calcium: 9.3 mg/dL (ref 8.9–10.3)
Chloride: 102 mmol/L (ref 98–111)
Creatinine, Ser: 0.92 mg/dL (ref 0.61–1.24)
GFR, Estimated: >60 mL/min (ref >60)
Glucose, Bld: 114 mg/dL — ABNORMAL HIGH (ref 70–99)
Potassium: 4 mmol/L (ref 3.5–5.1)
Sodium: 138 mmol/L (ref 135–145)
Total Bilirubin: 0.5 mg/dL (ref 0.3–1.2)
Total Protein: 7.8 g/dL (ref 6.5–8.1)

## 2021-07-18 LAB — CBC WITH DIFFERENTIAL/PLATELET
Abs Immature Granulocytes: 0.03 10*3/uL (ref 0.00–0.07)
Basophils Absolute: 0 10*3/uL (ref 0.0–0.1)
Basophils Relative: 0 %
Eosinophils Absolute: 0.2 10*3/uL (ref 0.0–0.5)
Eosinophils Relative: 3 %
HCT: 42.1 % (ref 39.0–52.0)
Hemoglobin: 14.1 g/dL (ref 13.0–17.0)
Immature Granulocytes: 1 %
Lymphocytes Relative: 51 %
Lymphs Abs: 3.2 10*3/uL (ref 0.7–4.0)
MCH: 29.3 pg (ref 26.0–34.0)
MCHC: 33.5 g/dL (ref 30.0–36.0)
MCV: 87.5 fL (ref 80.0–100.0)
Monocytes Absolute: 0.5 10*3/uL (ref 0.1–1.0)
Monocytes Relative: 8 %
Neutro Abs: 2.3 10*3/uL (ref 1.7–7.7)
Neutrophils Relative %: 37 %
Platelets: 235 10*3/uL (ref 150–400)
RBC: 4.81 MIL/uL (ref 4.22–5.81)
RDW: 14.1 % (ref 11.5–15.5)
WBC: 6.2 10*3/uL (ref 4.0–10.5)
nRBC: 0 % (ref 0.0–0.2)

## 2021-07-18 LAB — MAGNESIUM: Magnesium: 1.6 mg/dL — ABNORMAL LOW (ref 1.7–2.4)

## 2021-07-18 LAB — GLUCOSE, CAPILLARY: Glucose-Capillary: 104 mg/dL — ABNORMAL HIGH (ref 70–99)

## 2021-07-18 MED ORDER — MAGNESIUM OXIDE -MG SUPPLEMENT 400 (240 MG) MG PO TABS
800.0000 mg | ORAL_TABLET | Freq: Two times a day (BID) | ORAL | Status: DC
  Administered 2021-07-18 – 2021-07-30 (×25): 800 mg via ORAL
  Filled 2021-07-18 (×25): qty 2

## 2021-07-18 MED ORDER — MAGNESIUM OXIDE -MG SUPPLEMENT 400 (240 MG) MG PO TABS
800.0000 mg | ORAL_TABLET | Freq: Once | ORAL | Status: AC
  Administered 2021-07-18: 800 mg via ORAL
  Filled 2021-07-18: qty 2

## 2021-07-18 MED ORDER — MAGNESIUM SULFATE 2 GM/50ML IV SOLN: 2.0000 g | Freq: Once | INTRAVENOUS | Status: DC
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
## 2021-07-18 NOTE — Progress Notes (Signed)
Pt refused PIV insertion for IV Magnesium orders.  MD notified.

## 2021-07-19 LAB — MAGNESIUM: Magnesium: 1.8 mg/dL (ref 1.7–2.4)

## 2021-07-19 NOTE — Progress Notes (Addendum)
11am: CSW spoke with Lurena Joiner at [facility] to discuss patient. Lurena Joiner states CSW must speak with Mrs. Jimmey Ralph to discuss patient - CSW left voicemail requesting a return call.  8:45am: TOC continuing to pursue placement in collaboration with [facility] DSS and [facility] staff.  CSW left message for admissions at [facility].  Edwin Dada, MSW, LCSW Transitions of Care   Clinical Social Worker II [phone]
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
## 2021-07-20 NOTE — Plan of Care (Signed)

## 2021-07-20 NOTE — Plan of Care (Signed)
°  Problem: Education: Goal: Knowledge of General Education information will improve Description: Including pain rating scale, medication(s)/side effects and non-pharmacologic comfort measures Outcome: Progressing   Problem: Health Behavior/Discharge Planning: Goal: Ability to manage health-related needs will improve Outcome: Progressing   Problem: Activity: Goal: Risk for activity intolerance will decrease Outcome: Progressing   Problem: Nutrition: Goal: Adequate nutrition will be maintained Outcome: Progressing   Problem: Pain Managment: Goal: General experience of comfort will improve Outcome: Progressing   Problem: Elimination: Goal: Will not experience complications related to bowel motility Outcome: Progressing Goal: Will not experience complications related to urinary retention Outcome: Progressing   Problem: Coping: Goal: Level of anxiety will decrease Outcome: Progressing   Problem: Skin Integrity: Goal: Risk for impaired skin integrity will decrease Outcome: Progressing
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
## 2021-07-20 NOTE — Progress Notes (Addendum)
1:45pm: Patient was denied admission to [facility].  10am: CSW spoke with Asher Muir at [facility] who states there are male beds available and she is agreeable to review referral. CSW faxed clinicals for review.  CSW attempted to reach Mrs. Jimmey Ralph of [place] again without success.  Edwin Dada, MSW, LCSW Transitions of Care   Clinical Social Worker II [phone]

## 2021-07-21 DIAGNOSIS — R569 Unspecified convulsions: Secondary | ICD-10-CM

## 2021-07-21 LAB — GLUCOSE, CAPILLARY: Glucose-Capillary: 143 mg/dL — ABNORMAL HIGH (ref 70–99)
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
## 2021-07-21 NOTE — Plan of Care (Signed)
  Problem: Education: Goal: Knowledge of General Education information will improve Description: Including pain rating scale, medication(s)/side effects and non-pharmacologic comfort measures Outcome: Progressing   Problem: Health Behavior/Discharge Planning: Goal: Ability to manage health-related needs will improve Outcome: Progressing   Problem: Activity: Goal: Risk for activity intolerance will decrease Outcome: Progressing   Problem: Nutrition: Goal: Adequate nutrition will be maintained Outcome: Progressing   Problem: Coping: Goal: Level of anxiety will decrease Outcome: Progressing   Problem: Elimination: Goal: Will not experience complications related to bowel motility Outcome: Progressing Goal: Will not experience complications related to urinary retention Outcome: Progressing   

## 2021-07-21 NOTE — Progress Notes (Signed)
CSW completed referral forms for the [facility] to assist in applying for disability. CSW sent forms to be be signed and reviewed by patient's legal guardian.  CSW sent forms to the [facility] via secure e-mail.  Edwin Dada, MSW, LCSW Transitions of Care   Clinical Social Worker II [phone]

## 2021-07-21 NOTE — Progress Notes (Signed)
Mobility Specialist Criteria Algorithm Info.   07/21/21 1100  Mobility  Bed Position Semi-fowlers  Activity Ambulated with assistance in hallway  Range of Motion/Exercises Active;All extremities  Level of Assistance Modified independent, requires aide device or extra time  Assistive Device Front wheel walker  RUE Weight Bearing WBAT  LUE Weight Bearing WBAT  RLE Weight Bearing WBAT  LLE Weight Bearing WBAT  Distance Ambulated (ft) 550 ft  Activity Response Tolerated well   Patient ambulated in hallway independently with steady gait. Tolerated ambulation well without complaint or incident. Was left lying supine in bed with all needs met.   07/21/2021 11:00 AM  [place] Terressa Evola, [place], [place]  EXOGA:[phone] Office: [phone]

## 2021-07-22 ENCOUNTER — Other Ambulatory Visit (HOSPITAL_COMMUNITY): Payer: Self-pay

## 2021-07-22 LAB — GLUCOSE, CAPILLARY: Glucose-Capillary: 143 mg/dL — ABNORMAL HIGH (ref 70–99)

## 2021-07-22 MED ORDER — GLIPIZIDE 5 MG PO TABS
2.5000 mg | ORAL_TABLET | Freq: Every day | ORAL | 0 refills | Status: DC
  Filled 2021-07-22: qty 30, 60d supply, fill #0

## 2021-07-22 MED ORDER — ROSUVASTATIN CALCIUM 10 MG PO TABS
10.0000 mg | ORAL_TABLET | Freq: Every day | ORAL | 0 refills | Status: DC
  Filled 2021-07-22: qty 30, 30d supply, fill #0

## 2021-07-22 MED ORDER — OLANZAPINE 10 MG PO TBDP
10.0000 mg | ORAL_TABLET | Freq: Every day | ORAL | 0 refills | Status: DC
  Filled 2021-07-22: qty 30, 30d supply, fill #0

## 2021-07-22 MED ORDER — FAMOTIDINE 40 MG PO TABS
40.0000 mg | ORAL_TABLET | Freq: Every day | ORAL | 0 refills | Status: DC
  Filled 2021-07-22: qty 30, 30d supply, fill #0

## 2021-07-22 MED ORDER — DIVALPROEX SODIUM 125 MG PO CSDR
250.0000 mg | DELAYED_RELEASE_CAPSULE | Freq: Two times a day (BID) | ORAL | 0 refills | Status: DC
  Filled 2021-07-22: qty 240, 60d supply, fill #0

## 2021-07-22 MED ORDER — ADULT MULTIVITAMIN W/MINERALS CH
1.0000 | ORAL_TABLET | Freq: Every day | ORAL | 0 refills | Status: DC
  Filled 2021-07-22: qty 30, 30d supply, fill #0

## 2021-07-22 MED ORDER — CLONAZEPAM 0.5 MG PO TABS
0.5000 mg | ORAL_TABLET | Freq: Two times a day (BID) | ORAL | 0 refills | Status: DC
  Filled 2021-07-22: qty 60, 30d supply, fill #0

## 2021-07-22 MED ORDER — AMLODIPINE BESYLATE 10 MG PO TABS
10.0000 mg | ORAL_TABLET | Freq: Every day | ORAL | 0 refills | Status: DC
  Filled 2021-07-22: qty 30, 30d supply, fill #0

## 2021-07-22 MED ORDER — ACETAMINOPHEN 325 MG PO TABS: 650.0000 mg | ORAL_TABLET | ORAL | Status: DC | PRN

## 2021-07-22 MED ORDER — APIXABAN 2.5 MG PO TABS
2.5000 mg | ORAL_TABLET | Freq: Two times a day (BID) | ORAL | 0 refills | Status: DC
  Filled 2021-07-22: qty 60, 30d supply, fill #0

## 2021-07-22 MED ORDER — POLYETHYLENE GLYCOL 3350 17 G PO PACK
17.0000 g | PACK | Freq: Every day | ORAL | 0 refills | Status: DC
  Filled 2021-07-22: qty 14, 14d supply, fill #0

## 2021-07-22 MED ORDER — METFORMIN HCL 500 MG PO TABS
500.0000 mg | ORAL_TABLET | Freq: Two times a day (BID) | ORAL | 0 refills | Status: DC
  Filled 2021-07-22: qty 30, 15d supply, fill #0

## 2021-07-22 MED ORDER — VITAMIN D3 25 MCG PO TABS
2000.0000 [IU] | ORAL_TABLET | Freq: Two times a day (BID) | ORAL | 0 refills | Status: DC
  Filled 2021-07-22: qty 30, 8d supply, fill #0

## 2021-07-22 MED ORDER — FOLIC ACID 1 MG PO TABS
1.0000 mg | ORAL_TABLET | Freq: Every day | ORAL | 0 refills | Status: DC
  Filled 2021-07-22: qty 30, 30d supply, fill #0

## 2021-07-22 MED ORDER — GABAPENTIN 100 MG PO CAPS
100.0000 mg | ORAL_CAPSULE | Freq: Three times a day (TID) | ORAL | 0 refills | Status: DC
  Filled 2021-07-22: qty 90, 30d supply, fill #0

## 2021-07-22 MED ORDER — MAGNESIUM OXIDE -MG SUPPLEMENT 400 (240 MG) MG PO TABS
800.0000 mg | ORAL_TABLET | Freq: Two times a day (BID) | ORAL | 0 refills | Status: DC
  Filled 2021-07-22: qty 60, 15d supply, fill #0

## 2021-07-22 MED ORDER — IBUPROFEN 400 MG PO TABS: 400.0000 mg | ORAL_TABLET | Freq: Four times a day (QID) | ORAL | 0 refills | Status: DC | PRN

## 2021-07-22 MED ORDER — THIAMINE HCL 100 MG PO TABS
100.0000 mg | ORAL_TABLET | Freq: Every day | ORAL | 0 refills | Status: DC
  Filled 2021-07-22: qty 30, 30d supply, fill #0

## 2021-07-22 NOTE — Plan of Care (Signed)
?  Problem: Clinical Measurements: ?Goal: Ability to maintain clinical measurements within normal limits will improve ?Outcome: Progressing ?Goal: Will remain free from infection ?Outcome: Progressing ?Goal: Diagnostic test results will improve ?Outcome: Progressing ?  ?

## 2021-07-22 NOTE — Plan of Care (Signed)

## 2021-07-22 NOTE — Progress Notes (Signed)
CSW spoke with patient's legal guardian Sander Radon who states she will be coming to visit the patient tomorrow morning. CSW sent Heather updated clinicals for review.  CSW spoke with Bradly Bienenstock from [facility] to make an ACTT team referral - forms completed and returned via secure e-mail.  CSW spoke with Diplomatic Services operational officer at [facility] to make an ACTT team referral - forms completed and returned via secure e-mail.  Edwin Dada, MSW, LCSW Transitions of Care   Clinical Social Worker II [phone]

## 2021-07-22 NOTE — Progress Notes (Signed)
°  Mobility Specialist Criteria Algorithm Info.    07/22/21 1000  Mobility  Bed Position Semi-fowlers  Activity Ambulated independently in room;Ambulated independently to bathroom  Range of Motion/Exercises Active;All extremities  Level of Assistance Modified independent, requires aide device or extra time  Assistive Device Front wheel walker  RUE Weight Bearing WBAT  LUE Weight Bearing WBAT  RLE Weight Bearing WBAT  LLE Weight Bearing WBAT  Distance Ambulated (ft) 25 ft  Activity Response Tolerated well   Declined hallway ambulation at this time will check back as time permits.  07/22/2021 10:00 AM  [place] Roosvelt [place], CMS, BS EXP Acute Rehabilitation Services  Phone:[phone] Office: [phone]
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
## 2021-07-23 LAB — CBC
HCT: 41.1 % (ref 39.0–52.0)
Hemoglobin: 13.7 g/dL (ref 13.0–17.0)
MCH: 29.4 pg (ref 26.0–34.0)
MCHC: 33.3 g/dL (ref 30.0–36.0)
MCV: 88.2 fL (ref 80.0–100.0)
Platelets: 186 10*3/uL (ref 150–400)
RBC: 4.66 MIL/uL (ref 4.22–5.81)
RDW: 14.2 % (ref 11.5–15.5)
WBC: 6.1 10*3/uL (ref 4.0–10.5)
nRBC: 0 % (ref 0.0–0.2)

## 2021-07-23 LAB — BASIC METABOLIC PANEL
Anion gap: 10 (ref 5–15)
BUN: 11 mg/dL (ref 6–20)
CO2: 27 mmol/L (ref 22–32)
Calcium: 9.2 mg/dL (ref 8.9–10.3)
Chloride: 99 mmol/L (ref 98–111)
Creatinine, Ser: 0.87 mg/dL (ref 0.61–1.24)
GFR, Estimated: >60 mL/min (ref >60)
Glucose, Bld: 172 mg/dL — ABNORMAL HIGH (ref 70–99)
Potassium: 3.8 mmol/L (ref 3.5–5.1)
Sodium: 136 mmol/L (ref 135–145)

## 2021-07-23 LAB — GLUCOSE, CAPILLARY: Glucose-Capillary: 140 mg/dL — ABNORMAL HIGH (ref 70–99)

## 2021-07-23 NOTE — TOC Progression Note (Addendum)
Transition of Care [facility]) - Progression Note    Patient Details  Name: Jerome [name] MRN: [id number] Date of Birth: Oct 16, 1960  [facility] of [facility]) CM/SW Contact  Janae Bridgeman, RN Phone Number: 07/23/2021, 8:22 AM  Clinical Narrative:    CM called [facility] and scheduled a hospital follow up on August 24, 2021 at at 0930 am.  Appointment will be added to the discharge instructions.  CM plans to meet with Sander Radon today to assistance with coordination of discharge plans for safe housing for the patient through DSS Services.  TOC pharmacy will provide medications for discharge once discharge date and destination are determined.  I spoke with Sander Radon, DSS and plan to meet with her at the bedside with the patient.  The patient will be unable to discharge to a hotel today until the discharge plan is approved through DSS management according to [facility].    I called [employer] and ordered a rolling walker to be delivered to the hospital room today - for pending discharge at a later determined date.  CM and MSW with DTP continue to follow the patient for discharge planning.  CM and MSW    Expected Discharge Plan: Skilled Nursing Facility Barriers to Discharge: Homeless with medical needs, Inadequate or no insurance, Unsafe home situation  Expected Discharge Plan and Services Expected Discharge Plan: Skilled Nursing Facility   Discharge Planning Services: CM Consult   Living arrangements for the past 2 months: Homeless Shelter, Homeless                                       Social Determinants of Health (SDOH) Interventions    Readmission Risk Interventions No flowsheet data found.
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
## 2021-07-23 NOTE — TOC Progression Note (Signed)
Transition of Care [facility]) - Progression Note    Patient Details  Name: Jerome [name] MRN: [id number] Date of Birth: 1960-08-20  Transition of Care [facility]) CM/SW Contact  Curlene Labrum, RN Phone Number: 07/23/2021, 12:51 PM  Clinical Narrative:    CM met with Devoria Albe and [name] from DSS at the bedside.  DSS states that they will speak with [facility] and will continue to explore options for placement - including PACE or group home environment but that DSS was not in agreement for hotel placement for discharge but will follow up with TOC the first of next week for possible alternatives for placement outside of the hospital environment.  The patient's rolling walker from [employer] is present in the hospital room.  Discharge from the hospital is likely next week per DSS guardian, [name].    Expected Discharge Plan: [place] Barriers to Discharge: Homeless with medical needs, Inadequate or no insurance, Unsafe home situation  Expected Discharge Plan and Services Expected Discharge Plan: [place]   Discharge Planning Services: CM Consult   Living arrangements for the past 2 months: Homeless Shelter, Homeless                                       Social Determinants of Health (SDOH) Interventions    Readmission Risk Interventions No flowsheet data found.
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
## 2021-07-23 NOTE — Progress Notes (Signed)
Mobility Specialist Criteria Algorithm Info.    07/23/21 1230  Mobility  Bed Position Semi-fowlers  Activity Ambulated with assistance in hallway; Ambulated to bathroom  Range of Motion/Exercises Active;All extremities  Level of Assistance Modified independent, requires aide device or extra time  Assistive Device Front wheel walker  RUE Weight Bearing WBAT  LUE Weight Bearing WBAT  RLE Weight Bearing WBAT  LLE Weight Bearing WBAT  Distance Ambulated (ft) 480 ft  Activity Response Tolerated well   Patient ambulated in hallway independently with steady gait. Tolerated ambulation well without complaint or incident. Was left lying supine in bed with all needs met.   07/23/2021 3:14 PM  Jerome [name], [place], [place]  YWVXU:[phone] Office: [phone]

## 2021-07-24 DIAGNOSIS — F1020 Alcohol dependence, uncomplicated: Secondary | ICD-10-CM

## 2021-07-24 NOTE — Assessment & Plan Note (Addendum)
Patient has been tolerating well metformin and glyburide.  Tolerating po well.   Capillary glucose 95, 140

## 2021-07-24 NOTE — Assessment & Plan Note (Addendum)
Patient on olanzapine, depakote and klonipin  He has been calm and cooperative.

## 2021-07-24 NOTE — Plan of Care (Signed)
°  Problem: Health Behavior/Discharge Planning: Goal: Ability to manage health-related needs will improve Outcome: Progressing   Problem: Clinical Measurements: Goal: Ability to maintain clinical measurements within normal limits will improve Outcome: Progressing   Problem: Coping: Goal: Level of anxiety will decrease Outcome: Progressing   Problem: Pain Managment: Goal: General experience of comfort will improve Outcome: Progressing   Problem: Safety: Goal: Ability to remain free from injury will improve Outcome: Progressing   Problem: Skin Integrity: Goal: Risk for impaired skin integrity will decrease Outcome: Progressing

## 2021-07-24 NOTE — Assessment & Plan Note (Addendum)
Patient is calm and cooperative, no agitation. Patient has no capacity to make medical decisions.  Continue to follow up with transition of care team.   Patient with prolonged hospitalization pending placement.
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
## 2021-07-24 NOTE — Assessment & Plan Note (Addendum)
Continue blood pressure control with amlodipine. Blood pressure is 110 to 128 mmHg.
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
## 2021-07-24 NOTE — Assessment & Plan Note (Signed)
Continue PT and OT

## 2021-07-24 NOTE — Assessment & Plan Note (Addendum)
Continue PT and OT to improve mobility.  Encourage to improve mobility.

## 2021-07-24 NOTE — Assessment & Plan Note (Addendum)
No clinical signs of withdrawal, continue neuro checks per unit protocol.  Patient has been calm and cooperative,

## 2021-07-24 NOTE — Assessment & Plan Note (Addendum)
Continue pressure skin ulcer care per protocol.

## 2021-07-24 NOTE — Plan of Care (Signed)
  Problem: Health Behavior/Discharge Planning: Goal: Ability to manage health-related needs will improve Outcome: Progressing   Problem: Clinical Measurements: Goal: Ability to maintain clinical measurements within normal limits will improve Outcome: Progressing Goal: Will remain free from infection Outcome: Progressing   

## 2021-07-25 DIAGNOSIS — F1020 Alcohol dependence, uncomplicated: Secondary | ICD-10-CM

## 2021-07-25 LAB — GLUCOSE, CAPILLARY: Glucose-Capillary: 95 mg/dL (ref 70–99)
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
## 2021-07-26 DIAGNOSIS — S069X0S Unspecified intracranial injury without loss of consciousness, sequela: Secondary | ICD-10-CM

## 2021-07-26 DIAGNOSIS — S83104S Unspecified dislocation of right knee, sequela: Secondary | ICD-10-CM

## 2021-07-26 DIAGNOSIS — S42201A Unspecified fracture of upper end of right humerus, initial encounter for closed fracture: Secondary | ICD-10-CM

## 2021-07-26 DIAGNOSIS — S83106A Unspecified dislocation of unspecified knee, initial encounter: Secondary | ICD-10-CM

## 2021-07-26 LAB — GLUCOSE, CAPILLARY
Glucose-Capillary: 103 mg/dL — ABNORMAL HIGH (ref 70–99)
Glucose-Capillary: 91 mg/dL (ref 70–99)
Glucose-Capillary: 112 mg/dL — ABNORMAL HIGH (ref 70–99)

## 2021-07-26 NOTE — Plan of Care (Signed)
  Problem: Education: Goal: Knowledge of General Education information will improve Description Including pain rating scale, medication(s)/side effects and non-pharmacologic comfort measures Outcome: Progressing   Problem: Activity: Goal: Risk for activity intolerance will decrease Outcome: Progressing   Problem: Safety: Goal: Ability to remain free from injury will improve Outcome: Progressing   

## 2021-07-26 NOTE — Progress Notes (Signed)
CSW spoke with Jerome [name] at [facility] DSS who stated the agency wants the patient placed in a group home or family care home due to his long list of medications.  CSW attempted to reach [place] of [facility] in [place] without success - a voicemail was left requesting a return call.  Edwin Dada, MSW, LCSW Transitions of Care   Clinical Social Worker II [phone]
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
## 2021-07-27 DIAGNOSIS — S062X1A Diffuse traumatic brain injury with loss of consciousness of 30 minutes or less, initial encounter: Secondary | ICD-10-CM

## 2021-07-27 LAB — GLUCOSE, CAPILLARY
Glucose-Capillary: 99 mg/dL (ref 70–99)
Glucose-Capillary: 107 mg/dL — ABNORMAL HIGH (ref 70–99)

## 2021-07-27 NOTE — Plan of Care (Signed)
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
## 2021-07-27 NOTE — Plan of Care (Signed)
°  Problem: Health Behavior/Discharge Planning: Goal: Ability to manage health-related needs will improve Outcome: Progressing   Problem: Clinical Measurements: Goal: Ability to maintain clinical measurements within normal limits will improve Outcome: Progressing Goal: Will remain free from infection Outcome: Progressing Goal: Diagnostic test results will improve Outcome: Progressing Goal: Respiratory complications will improve Outcome: Progressing Goal: Cardiovascular complication will be avoided Outcome: Progressing   Problem: Clinical Measurements: Goal: Will remain free from infection Outcome: Progressing   Problem: Clinical Measurements: Goal: Diagnostic test results will improve Outcome: Progressing   Problem: Clinical Measurements: Goal: Respiratory complications will improve Outcome: Progressing   Problem: Clinical Measurements: Goal: Cardiovascular complication will be avoided Outcome: Progressing   Problem: Activity: Goal: Risk for activity intolerance will decrease Outcome: Progressing   Problem: Nutrition: Goal: Adequate nutrition will be maintained Outcome: Progressing   Problem: Coping: Goal: Level of anxiety will decrease Outcome: Progressing   Problem: Skin Integrity: Goal: Risk for impaired skin integrity will decrease Outcome: Progressing   Problem: Safety: Goal: Ability to remain free from injury will improve Outcome: Progressing   Problem: Clinical Measurements: Goal: Ability to maintain clinical measurements within normal limits will improve Outcome: Progressing Goal: Postoperative complications will be avoided or minimized Outcome: Progressing
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
## 2021-07-27 NOTE — Progress Notes (Signed)
CSW spoke with [facility] in [place] who states he does have a male bed open at this time. Doristine Mango is agreeable to review referral - clinicals sent via secure e-mail.  CSW will discuss the need for an LOG with [facility] Director and DSS guardianship staff.  Edwin Dada, MSW, LCSW Transitions of Care   Clinical Social Worker II [phone]

## 2021-07-28 ENCOUNTER — Other Ambulatory Visit (HOSPITAL_COMMUNITY): Payer: Self-pay

## 2021-07-28 ENCOUNTER — Inpatient Hospital Stay (HOSPITAL_COMMUNITY): Payer: No Typology Code available for payment source

## 2021-07-28 LAB — GLUCOSE, CAPILLARY: Glucose-Capillary: 191 mg/dL — ABNORMAL HIGH (ref 70–99)

## 2021-07-28 MED ORDER — GLIPIZIDE 5 MG PO TABS: 2.5000 mg | ORAL_TABLET | Freq: Every day | ORAL | 0 refills | Status: DC

## 2021-07-28 MED ORDER — METFORMIN HCL 500 MG PO TABS
500.0000 mg | ORAL_TABLET | Freq: Two times a day (BID) | ORAL | 0 refills | Status: DC
  Filled 2021-07-28: qty 30, 15d supply, fill #0

## 2021-07-28 MED ORDER — CLONAZEPAM 0.5 MG PO TABS: 0.5000 mg | ORAL_TABLET | Freq: Two times a day (BID) | ORAL | 0 refills | Status: DC

## 2021-07-28 MED ORDER — DIVALPROEX SODIUM 125 MG PO CSDR
250.0000 mg | DELAYED_RELEASE_CAPSULE | Freq: Two times a day (BID) | ORAL | 0 refills | Status: DC
  Filled 2021-07-28: qty 240, 60d supply, fill #0

## 2021-07-28 MED ORDER — FAMOTIDINE 40 MG PO TABS
40.0000 mg | ORAL_TABLET | Freq: Every day | ORAL | 0 refills | Status: DC
  Filled 2021-07-28: qty 30, 30d supply, fill #0

## 2021-07-28 MED ORDER — CLONAZEPAM 0.5 MG PO TABS
0.5000 mg | ORAL_TABLET | Freq: Two times a day (BID) | ORAL | 0 refills | Status: DC
  Filled 2021-07-28: qty 60, 30d supply, fill #0

## 2021-07-28 MED ORDER — ADULT MULTIVITAMIN W/MINERALS CH: 1.0000 | ORAL_TABLET | Freq: Every day | ORAL | 0 refills | Status: DC

## 2021-07-28 MED ORDER — AMLODIPINE BESYLATE 10 MG PO TABS: 10.0000 mg | ORAL_TABLET | Freq: Every day | ORAL | 0 refills | Status: DC

## 2021-07-28 MED ORDER — GLIPIZIDE 5 MG PO TABS
2.5000 mg | ORAL_TABLET | Freq: Every day | ORAL | 0 refills | Status: DC
  Filled 2021-07-28: qty 30, 60d supply, fill #0

## 2021-07-28 MED ORDER — POLYETHYLENE GLYCOL 3350 17 G PO PACK: 17.0000 g | PACK | Freq: Every day | ORAL | 0 refills | Status: DC

## 2021-07-28 MED ORDER — MAGNESIUM OXIDE -MG SUPPLEMENT 400 (240 MG) MG PO TABS
800.0000 mg | ORAL_TABLET | Freq: Two times a day (BID) | ORAL | 0 refills | Status: DC
  Filled 2021-07-28: qty 120, 30d supply, fill #0

## 2021-07-28 MED ORDER — TUBERCULIN PPD 5 UNIT/0.1ML ID SOLN
5.0000 [IU] | Freq: Once | INTRADERMAL | Status: AC
  Administered 2021-07-28: 5 [IU] via INTRADERMAL
  Filled 2021-07-28: qty 0.1

## 2021-07-28 MED ORDER — APIXABAN 2.5 MG PO TABS: 2.5000 mg | ORAL_TABLET | Freq: Two times a day (BID) | ORAL | 0 refills | Status: DC

## 2021-07-28 MED ORDER — MAGNESIUM OXIDE -MG SUPPLEMENT 400 (240 MG) MG PO TABS: 800.0000 mg | ORAL_TABLET | Freq: Two times a day (BID) | ORAL | 0 refills | Status: DC

## 2021-07-28 MED ORDER — POLYETHYLENE GLYCOL 3350 17 G PO PACK
17.0000 g | PACK | Freq: Every day | ORAL | 0 refills | Status: DC
  Filled 2021-07-28: qty 14, 14d supply, fill #0

## 2021-07-28 MED ORDER — AMLODIPINE BESYLATE 10 MG PO TABS
10.0000 mg | ORAL_TABLET | Freq: Every day | ORAL | 0 refills | Status: DC
  Filled 2021-07-28: qty 30, 30d supply, fill #0

## 2021-07-28 MED ORDER — ROSUVASTATIN CALCIUM 10 MG PO TABS: 10.0000 mg | ORAL_TABLET | Freq: Every day | ORAL | 0 refills | Status: DC

## 2021-07-28 MED ORDER — ACETAMINOPHEN 325 MG PO TABS: 650.0000 mg | ORAL_TABLET | ORAL | Status: DC | PRN

## 2021-07-28 MED ORDER — FAMOTIDINE 40 MG PO TABS: 40.0000 mg | ORAL_TABLET | Freq: Every day | ORAL | 0 refills | Status: DC

## 2021-07-28 MED ORDER — ROSUVASTATIN CALCIUM 10 MG PO TABS
10.0000 mg | ORAL_TABLET | Freq: Every day | ORAL | 0 refills | Status: DC
  Filled 2021-07-28: qty 30, 30d supply, fill #0

## 2021-07-28 MED ORDER — OLANZAPINE 10 MG PO TBDP: 10.0000 mg | ORAL_TABLET | Freq: Every day | ORAL | 0 refills | Status: DC

## 2021-07-28 MED ORDER — IBUPROFEN 400 MG PO TABS: 400.0000 mg | ORAL_TABLET | Freq: Four times a day (QID) | ORAL | 0 refills | Status: DC | PRN

## 2021-07-28 MED ORDER — FOLIC ACID 1 MG PO TABS
1.0000 mg | ORAL_TABLET | Freq: Every day | ORAL | 0 refills | Status: DC
  Filled 2021-07-28: qty 30, 30d supply, fill #0

## 2021-07-28 MED ORDER — GABAPENTIN 100 MG PO CAPS: 100.0000 mg | ORAL_CAPSULE | Freq: Three times a day (TID) | ORAL | 0 refills | Status: DC

## 2021-07-28 MED ORDER — OLANZAPINE 10 MG PO TBDP
10.0000 mg | ORAL_TABLET | Freq: Every day | ORAL | 0 refills | Status: DC
  Filled 2021-07-28: qty 30, 30d supply, fill #0

## 2021-07-28 MED ORDER — DIVALPROEX SODIUM 125 MG PO CSDR: 250.0000 mg | DELAYED_RELEASE_CAPSULE | Freq: Two times a day (BID) | ORAL | 0 refills | Status: DC

## 2021-07-28 MED ORDER — FOLIC ACID 1 MG PO TABS: 1.0000 mg | ORAL_TABLET | Freq: Every day | ORAL | 0 refills | Status: DC

## 2021-07-28 MED ORDER — APIXABAN 2.5 MG PO TABS
2.5000 mg | ORAL_TABLET | Freq: Two times a day (BID) | ORAL | 0 refills | Status: DC
  Filled 2021-07-28: qty 60, 30d supply, fill #0

## 2021-07-28 MED ORDER — GABAPENTIN 100 MG PO CAPS
100.0000 mg | ORAL_CAPSULE | Freq: Three times a day (TID) | ORAL | 0 refills | Status: DC
  Filled 2021-07-28: qty 90, 30d supply, fill #0

## 2021-07-28 MED ORDER — BLOOD GLUCOSE MONITOR KIT
PACK | 0 refills | Status: AC
  Filled 2021-07-28: qty 1, fill #0

## 2021-07-28 MED ORDER — METFORMIN HCL 500 MG PO TABS: 500.0000 mg | ORAL_TABLET | Freq: Two times a day (BID) | ORAL | 0 refills | Status: DC
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
## 2021-07-28 NOTE — Progress Notes (Addendum)
12pm: CSW received return call from [facility] who states he can offer the patient a bed at the [facility] in [place]. [facility]inet can be discharged as early as Friday, after his TB skin test is read.  DSS and [facility] Leadership are still finalizing the financial portion of the discharge.  10:10am: CSW spoke with Madelynn Done who states he will come visit the patient today around 11am. Madelynn Done is requesting a TB skin test be ordered for patient - CSW notified NP of request.  Madilyn Fireman, MSW, LCSW Transitions of Care   Clinical Social Worker II [phone]

## 2021-07-28 NOTE — Plan of Care (Addendum)
TB administered. Unable to give at scheduled time due to not being filled by pharmacy until later.   Problem: Education: Goal: Knowledge of General Education information will improve Description: Including pain rating scale, medication(s)/side effects and non-pharmacologic comfort measures Outcome: Progressing   Problem: Activity: Goal: Risk for activity intolerance will decrease Outcome: Progressing   Problem: Pain Managment: Goal: General experience of comfort will improve Outcome: Progressing   Problem: Safety: Goal: Ability to remain free from injury will improve Outcome: Progressing

## 2021-07-29 ENCOUNTER — Inpatient Hospital Stay (HOSPITAL_COMMUNITY): Payer: No Typology Code available for payment source

## 2021-07-29 DIAGNOSIS — S062X1S Diffuse traumatic brain injury with loss of consciousness of 30 minutes or less, sequela: Secondary | ICD-10-CM

## 2021-07-29 LAB — GLUCOSE, CAPILLARY: Glucose-Capillary: 140 mg/dL — ABNORMAL HIGH (ref 70–99)

## 2021-07-29 NOTE — Plan of Care (Addendum)
Assessed TB injection site. Site is 30mm in width, red, and raised. Notified Dr. Rito Ehrlich for further needs.     Problem: Education: Goal: Knowledge of General Education information will improve Description: Including pain rating scale, medication(s)/side effects and non-pharmacologic comfort measures Outcome: Progressing   Problem: Activity: Goal: Risk for activity intolerance will decrease Outcome: Progressing   Problem: Safety: Goal: Ability to remain free from injury will improve Outcome: Progressing

## 2021-07-29 NOTE — Progress Notes (Signed)
CSW received confirmation from Devoria Albe at [facility] she will be providing transportation to [address] in [place] on Friday at 2pm.  CSW will attempt to obtain clothing and shoes for patient per NP request.  Patient's prescriptions have been sent to [facility] to be filled prior to discharge.  CSW spoke with Dr. Lolita Lenz at the [facility] Department to discuss patient. Dr. Lolita Lenz will complete chart review and will contact patient's legal guardian to schedule an appointment for treatment.  Madilyn Fireman, MSW, LCSW Transitions of Care   Clinical Social Worker II [phone]
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
## 2021-07-30 ENCOUNTER — Other Ambulatory Visit (HOSPITAL_COMMUNITY): Payer: Self-pay

## 2021-07-30 MED ORDER — DIVALPROEX SODIUM 500 MG PO DR TAB
500.0000 mg | DELAYED_RELEASE_TABLET | Freq: Two times a day (BID) | ORAL | 0 refills | Status: DC
  Filled 2021-07-30: qty 60, 30d supply, fill #0

## 2021-07-30 MED ORDER — ROSUVASTATIN CALCIUM 10 MG PO TABS
10.0000 mg | ORAL_TABLET | Freq: Every day | ORAL | 0 refills | Status: DC
  Filled 2021-07-30: qty 30, 30d supply, fill #0

## 2021-07-30 MED ORDER — FOLIC ACID 1 MG PO TABS
1.0000 mg | ORAL_TABLET | Freq: Every day | ORAL | 0 refills | Status: DC
  Filled 2021-07-30: qty 30, 30d supply, fill #0

## 2021-07-30 MED ORDER — IBUPROFEN 400 MG PO TABS: 400.0000 mg | ORAL_TABLET | Freq: Four times a day (QID) | ORAL | 0 refills | Status: DC | PRN

## 2021-07-30 MED ORDER — DIVALPROEX SODIUM 250 MG PO DR TAB: 1000.0000 mg | DELAYED_RELEASE_TABLET | Freq: Two times a day (BID) | ORAL | 2 refills | Status: DC

## 2021-07-30 MED ORDER — CERTAVITE/ANTIOXIDANTS PO TABS
1.0000 | ORAL_TABLET | Freq: Every day | ORAL | 0 refills | Status: DC
  Filled 2021-07-30: qty 30, 30d supply, fill #0

## 2021-07-30 MED ORDER — ACETAMINOPHEN 325 MG PO TABS
650.0000 mg | ORAL_TABLET | ORAL | 0 refills | Status: DC | PRN
  Filled 2021-07-30: qty 100, 9d supply, fill #0

## 2021-07-30 MED ORDER — POLYETHYLENE GLYCOL 3350 17 GM/SCOOP PO POWD
17.0000 g | Freq: Every day | ORAL | 0 refills | Status: DC
  Filled 2021-07-30: qty 238, 14d supply, fill #0

## 2021-07-30 MED ORDER — GLIPIZIDE 5 MG PO TABS
2.5000 mg | ORAL_TABLET | Freq: Every day | ORAL | 0 refills | Status: DC
  Filled 2021-07-30: qty 15, 30d supply, fill #0

## 2021-07-30 MED ORDER — GABAPENTIN 100 MG PO CAPS
100.0000 mg | ORAL_CAPSULE | Freq: Three times a day (TID) | ORAL | 0 refills | Status: AC
Start: 2021-07-30 — End: ?
  Filled 2021-07-30: qty 90, 30d supply, fill #0

## 2021-07-30 MED ORDER — OLANZAPINE 10 MG PO TABS
10.0000 mg | ORAL_TABLET | Freq: Every day | ORAL | 0 refills | Status: DC
  Filled 2021-07-30: qty 30, 30d supply, fill #0

## 2021-07-30 MED ORDER — MAGNESIUM OXIDE 400 MG PO TABS
800.0000 mg | ORAL_TABLET | Freq: Two times a day (BID) | ORAL | 0 refills | Status: DC
  Filled 2021-07-30: qty 120, 30d supply, fill #0

## 2021-07-30 MED ORDER — ACCU-CHEK GUIDE VI STRP
ORAL_STRIP | 12 refills | Status: AC
  Filled 2021-07-30: qty 100, 30d supply, fill #0

## 2021-07-30 MED ORDER — APIXABAN 2.5 MG PO TABS
2.5000 mg | ORAL_TABLET | Freq: Two times a day (BID) | ORAL | 0 refills | Status: DC
  Filled 2021-07-30: qty 60, 30d supply, fill #0

## 2021-07-30 MED ORDER — METFORMIN HCL 500 MG PO TABS
500.0000 mg | ORAL_TABLET | Freq: Two times a day (BID) | ORAL | 0 refills | Status: DC
  Filled 2021-07-30: qty 60, 30d supply, fill #0

## 2021-07-30 MED ORDER — DIVALPROEX SODIUM 125 MG PO CSDR
250.0000 mg | DELAYED_RELEASE_CAPSULE | Freq: Two times a day (BID) | ORAL | 0 refills | Status: DC
  Filled 2021-07-30: qty 240, 60d supply, fill #0

## 2021-07-30 MED ORDER — ACCU-CHEK SOFTCLIX LANCETS MISC
5 refills | Status: AC
  Filled 2021-07-30: qty 100, 30d supply, fill #0

## 2021-07-30 MED ORDER — AMLODIPINE BESYLATE 10 MG PO TABS
10.0000 mg | ORAL_TABLET | Freq: Every day | ORAL | 0 refills | Status: DC
  Filled 2021-07-30: qty 30, 30d supply, fill #0

## 2021-07-30 MED ORDER — CLONAZEPAM 0.5 MG PO TABS
0.5000 mg | ORAL_TABLET | Freq: Two times a day (BID) | ORAL | 0 refills | Status: AC
Start: 2021-07-30 — End: ?
  Filled 2021-07-30: qty 60, 30d supply, fill #0

## 2021-07-30 MED ORDER — FAMOTIDINE 20 MG PO TABS
40.0000 mg | ORAL_TABLET | Freq: Every day | ORAL | 0 refills | Status: DC
  Filled 2021-07-30: qty 60, 30d supply, fill #0

## 2021-07-30 MED ORDER — DIVALPROEX SODIUM 500 MG PO DR TAB: 500.0000 mg | DELAYED_RELEASE_TABLET | Freq: Two times a day (BID) | ORAL | 0 refills | Status: DC

## 2021-07-30 MED ORDER — ACCU-CHEK GUIDE W/DEVICE KIT
PACK | 0 refills | Status: AC
  Filled 2021-07-30: qty 1, 30d supply, fill #0
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
## 2021-07-30 NOTE — TOC Transition Note (Signed)
Transition of Care [facility]) - CM/SW Discharge Note   Patient Details  Name: Jerome [name] MRN: [id number] Date of Birth: 1960/11/09  Transition of [facility]) CM/SW Contact:  Janae Bridgeman, RN Phone Number: 07/30/2021, 11:02 AM   Clinical Narrative:    CM spoke with Junious Silk, NP and the patient's discharge medications will be provided through [facility] pharmacy and delivered to the nursing unit prior to discharge to the family care home in [facility] today.  MATCH was placed to provide medication assistance to the patient since he currently has no insurance with Medicaid application pending through DSS.  RW was ordered through [employer] and will be sent home with the patient today.  Patient was provided with clothing and shoes through the [facility] department prior to discharge to home.  Patient was set up with follow up visits with outpatient visits.  PCP follow up at this time was scheduled through [facility] since [facility] Dept. Does not have primary care services available to the patient.  Lebron Conners, DSS SW will be providing transportation to the family care home.   Final next level of care: Skilled Nursing Facility (Disposition needs coordinated through [facility] Case management) Barriers to Discharge: Homeless with medical needs, Inadequate or no insurance, Unsafe home situation   Patient Goals and CMS Choice Patient states their goals for this hospitalization and ongoing recovery are:: Patient is guardian of DSS - Needs placement coordinated through [email or web address] Compare Post Acute Care list provided to:: Legal Guardian (DSS is legal guardian)    Discharge Placement                       Discharge Plan and Services   Discharge Planning Services: CM Consult                                 Social Determinants of Health (SDOH) Interventions     Readmission Risk Interventions No flowsheet data found.

## 2021-07-30 NOTE — Plan of Care (Signed)

## 2021-07-30 NOTE — Progress Notes (Signed)
Discharge summary packet/pertinent documents provided to Jerome [name], legal guardian . She verbalized understanding of instructions . No complaints. Pt d/c to [facility] as ordered. Pt remains alert/oriented in no apparent distress.

## 2021-08-02 LAB — QUANTIFERON-TB GOLD PLUS (RQFGPL)
QuantiFERON Mitogen Value: >10 IU/mL
QuantiFERON Nil Value: 0.07 IU/mL
QuantiFERON TB1 Ag Value: 0.08 IU/mL
QuantiFERON TB2 Ag Value: 0.08 IU/mL

## 2021-08-02 LAB — QUANTIFERON-TB GOLD PLUS: QuantiFERON-TB Gold Plus: NEGATIVE

## 2021-08-05 ENCOUNTER — Telehealth: Payer: Self-pay | Admitting: Surgery

## 2021-08-05 NOTE — Telephone Encounter (Signed)
Spoke with legal gaurdian, QFT results we can see in EPIC are negative. If anything changes in future, can contact [facility] for treatment of LTBI but not indicated based on the documentation we have right now.   Emailed legal guardian Caryn Bee Mbumina the patient's negative QFT results.  Jennye Moccasin, MD

## 2021-08-24 ENCOUNTER — Ambulatory Visit (INDEPENDENT_AMBULATORY_CARE_PROVIDER_SITE_OTHER): Payer: Self-pay | Admitting: Primary Care

## 2021-08-24 ENCOUNTER — Telehealth (INDEPENDENT_AMBULATORY_CARE_PROVIDER_SITE_OTHER): Payer: Self-pay | Admitting: Primary Care

## 2021-08-24 ENCOUNTER — Encounter (INDEPENDENT_AMBULATORY_CARE_PROVIDER_SITE_OTHER): Payer: Self-pay | Admitting: Primary Care

## 2021-08-24 ENCOUNTER — Other Ambulatory Visit: Payer: Self-pay

## 2021-08-24 VITALS — BP 151/103 | HR 102 | Temp 98.0°F | Ht 65.0 in | Wt 178.4 lb

## 2021-08-24 DIAGNOSIS — I10 Essential (primary) hypertension: Secondary | ICD-10-CM

## 2021-08-24 DIAGNOSIS — F203 Undifferentiated schizophrenia: Secondary | ICD-10-CM

## 2021-08-24 DIAGNOSIS — E119 Type 2 diabetes mellitus without complications: Secondary | ICD-10-CM

## 2021-08-24 DIAGNOSIS — Z09 Encounter for follow-up examination after completed treatment for conditions other than malignant neoplasm: Secondary | ICD-10-CM

## 2021-08-24 DIAGNOSIS — K5900 Constipation, unspecified: Secondary | ICD-10-CM

## 2021-08-24 DIAGNOSIS — B1920 Unspecified viral hepatitis C without hepatic coma: Secondary | ICD-10-CM

## 2021-08-24 DIAGNOSIS — Z7689 Persons encountering health services in other specified circumstances: Secondary | ICD-10-CM

## 2021-08-24 MED ORDER — MAGNESIUM OXIDE 400 MG PO TABS: 800.0000 mg | ORAL_TABLET | Freq: Two times a day (BID) | ORAL | 0 refills | Status: DC

## 2021-08-24 MED ORDER — GLIPIZIDE ER 10 MG PO TB24: 10.0000 mg | ORAL_TABLET | Freq: Every day | ORAL | 1 refills | Status: DC

## 2021-08-24 MED ORDER — METFORMIN HCL 1000 MG PO TABS: 1000.0000 mg | ORAL_TABLET | Freq: Two times a day (BID) | ORAL | 1 refills | Status: DC

## 2021-08-24 MED ORDER — POLYETHYLENE GLYCOL 3350 17 GM/SCOOP PO POWD: 17.0000 g | Freq: Every day | ORAL | 6 refills | Status: AC

## 2021-08-24 MED ORDER — AMLODIPINE BESYLATE 10 MG PO TABS: 10.0000 mg | ORAL_TABLET | Freq: Every day | ORAL | 1 refills | Status: DC

## 2021-08-24 MED ORDER — VALSARTAN-HYDROCHLOROTHIAZIDE 160-25 MG PO TABS: 1.0000 | ORAL_TABLET | Freq: Every day | ORAL | 3 refills | Status: DC

## 2021-08-24 MED ORDER — FAMOTIDINE 40 MG PO TABS: ORAL_TABLET | ORAL | 1 refills | Status: AC

## 2021-08-24 MED ORDER — FOLIC ACID 1 MG PO TABS: 1.0000 mg | ORAL_TABLET | Freq: Every day | ORAL | 1 refills | Status: AC

## 2021-08-24 MED ORDER — DICLOFENAC SODIUM 1 % EX GEL: 4.0000 g | Freq: Four times a day (QID) | CUTANEOUS | 0 refills | Status: AC
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
## 2021-08-24 NOTE — Telephone Encounter (Signed)
Spoke with pharmacist  

## 2021-08-24 NOTE — Telephone Encounter (Signed)
[phone].Marland Kitchen Jerome [name].[name] with [facility] needs a call back to clarify the dosage about one of his meds..[phone].

## 2021-08-26 ENCOUNTER — Encounter: Payer: Self-pay | Admitting: Family

## 2021-08-26 ENCOUNTER — Other Ambulatory Visit (HOSPITAL_COMMUNITY): Payer: Self-pay

## 2021-08-26 ENCOUNTER — Other Ambulatory Visit: Payer: Self-pay

## 2021-08-26 ENCOUNTER — Ambulatory Visit (INDEPENDENT_AMBULATORY_CARE_PROVIDER_SITE_OTHER): Payer: Medicaid Other | Admitting: Family

## 2021-08-26 ENCOUNTER — Telehealth: Payer: Self-pay

## 2021-08-26 VITALS — BP 145/91 | HR 99 | Temp 97.8°F | Resp 16 | Ht 71.5 in | Wt 176.8 lb

## 2021-08-26 DIAGNOSIS — B182 Chronic viral hepatitis C: Secondary | ICD-10-CM | POA: Diagnosis not present

## 2021-08-26 NOTE — Patient Instructions (Signed)
Nice to see you. ? ?We will check your lab work and arrange financial assistance.  ? ?Pharmacy staff will be in contact about starting medication once approved.  ? ?Plan for follow up 1 month after starting medication. ? ? ?Limit acetaminophen (Tylenol) usage to no more than 2 grams (2,000 mg) per day. ? ?Avoid alcohol. ? ?Do not share toothbrushes or razors. ? ?Practice safe sex to protect against transmission as well as sexually transmitted disease.  ? ? ?Hepatitis C ?Hepatitis C is a viral infection of the liver. It can lead to scarring of the liver (cirrhosis), liver failure, or liver cancer. Hepatitis C may go undetected for months or years because people with the infection may not have symptoms, or they may have only mild symptoms. ?What are the causes? ?This condition is caused by the hepatitis C virus (HCV). The virus can spread from person to person (is contagious) through: ?Blood. ?Childbirth. A woman who has hepatitis C can pass it to her baby during birth. ?Bodily fluids, such as breast milk, tears, semen, vaginal fluids, and saliva. ?Blood transfusions or organ transplants done in the [place] before 1992. ? ?What increases the risk? ?The following factors may make you more likely to develop this condition: ?Having contact with unclean (contaminated) needles or syringes. This may result from: ?Acupuncture. ?Tattoing. ?Body piercing. ?Injecting drugs. ?Having unprotected sex with someone who is infected. ?Needing treatment to filter your blood (kidney dialysis). ?Having HIV (human immunodeficiency virus) or AIDS (acquired immunodeficiency syndrome). ?Working in a job that involves contact with blood or bodily fluids, such as health care. ? ?What are the signs or symptoms? ?Symptoms of this condition include: ?Fatigue. ?Loss of appetite. ?Nausea. ?Vomiting. ?Abdominal pain. ?Dark yellow urine. ?Yellowish skin and eyes (jaundice). ?Itchy skin. ?Clay-colored bowel movements. ?Joint pain. ?Bleeding and  bruising easily. ?Fluid building up in your stomach (ascites). ? ?In some cases, you may not have any symptoms. ?How is this diagnosed? ?This condition is diagnosed with: ?Blood tests. ?Other tests to check how well your liver is functioning. They may include: ?Magnetic resonance elastography (MRE). This imaging test uses MRIs and sound waves to measure liver stiffness. ?Transient elastography. This imaging test uses ultrasounds to measure liver stiffness. ?Liver biopsy. This test requires taking a small tissue sample from your liver to examine it under a microscope. ? ?How is this treated? ?Your health care provider may perform noninvasive tests or a liver biopsy to help decide the best course of treatment. Treatment may include: ?Antiviral medicines and other medicines. ?Follow-up treatments every 6-12 months for infections or other liver conditions. ?Receiving a donated liver (liver transplant). ? ?Follow these instructions at home: ?Medicines ?Take over-the-counter and prescription medicines only as told by your health care provider. ?Take your antiviral medicine as told by your health care provider. Do not stop taking the antiviral even if you start to feel better. ?Do not take any medicines unless approved by your health care provider, including over-the-counter medicines and birth control pills. ?Activity ?Rest as needed. ?Do not have sex unless approved by your health care provider. ?Ask your health care provider when you may return to school or work. ?Eating and drinking ?Eat a balanced diet with plenty of fruits and vegetables, whole grains, and lowfat (lean) meats or non-meat proteins (such as beans or tofu). ?Drink enough fluids to keep your urine clear or pale yellow. ?Do not drink alcohol. ?General instructions ?Do not share toothbrushes, nail clippers, or razors. ?Wash your hands frequently with soap  and water. If soap and water are not available, use hand sanitizer. ?Cover any cuts or open sores on  your skin to prevent spreading the virus. ?Keep all follow-up visits as told by your health care provider. This is important. You may need follow-up visits every 6-12 months. ?How is this prevented? ?There is no vaccine for hepatitis C. The only way to prevent the disease is to reduce the risk of exposure to the virus. Make sure you: ?Wash your hands frequently with soap and water. If soap and water are not available, use hand sanitizer. ?Do not share needles or syringes. ?Practice safe sex and use condoms. ?Avoid handling blood or bodily fluids without gloves or other protection. ?Avoid getting tattoos or piercings in shops or other locations that are not clean. ? ?Contact a health care provider if: ?You have a fever. ?You develop abdominal pain. ?You pass dark urine. ?You pass clay-colored stools. ?You develop joint pain. ?Get help right away if: ?You have increasing fatigue or weakness. ?You lose your appetite. ?You cannot eat or drink without vomiting. ?You develop jaundice or your jaundice gets worse. ?You bruise or bleed easily. ?Summary ?Hepatitis C is a viral infection of the liver. It can lead to scarring of the liver (cirrhosis), liver failure, or liver cancer. ?The hepatitis C virus (HCV) causes this condition. The virus can pass from person to person (is contagious). ?You should not take any medicines unless approved by your health care provider. This includes over-the-counter medicines and birth control pills. ?This information is not intended to replace advice given to you by your health care provider. Make sure you discuss any questions you have with your health care provider. ?Document Released: 06/10/2000 Document Revised: 07/19/2016 Document Reviewed: 07/19/2016 ?Elsevier Interactive Patient Education ? 2018 Elsevier Inc. ? ?
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
## 2021-08-26 NOTE — Assessment & Plan Note (Signed)
Jerome [name] is a 61 year old male with genotype 1b chronic hepatitis C likely obtained from previous history of IV drug use or age.  Initial viral load of 5.76 million.  CT scan on 03/27/2021 with no focal liver abnormalities.  HIV and hepatitis B testing negative.  We reviewed the pathogenesis, risks if left untreated, treatment options, and financials associated with hepatitis C.  Check fibrosis score and hepatic panel.  Plan for treatment with Mavyret and follow-up 1 month following initiation of medication ?

## 2021-08-26 NOTE — Telephone Encounter (Signed)
[facility] Patient Advocate Encounter ? ?Insurance verification completed.   ? ?The patient is uninsured and will need patient assistance for medication. ? ?We can complete the application and will need to meet with the patient for signatures and income documentation. ? ?Margot Oriordan, CPhT ?Specialty Pharmacy Patient Advocate ?[facility] ?Phone: [phone] ?Fax:  [phone]  ?

## 2021-08-27 ENCOUNTER — Telehealth (INDEPENDENT_AMBULATORY_CARE_PROVIDER_SITE_OTHER): Payer: Self-pay

## 2021-08-27 NOTE — Telephone Encounter (Signed)
Called [place] LVM to call office back. Patient does not necessary have to have blood sugars checked on oral agents only ?
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
## 2021-09-01 LAB — HEPATIC FUNCTION PANEL
AG Ratio: 0.9 (calc) — ABNORMAL LOW (ref 1.0–2.5)
ALT: 24 U/L (ref 9–46)
AST: 30 U/L (ref 10–35)
Albumin: 4.5 g/dL (ref 3.6–5.1)
Alkaline phosphatase (APISO): 176 U/L — ABNORMAL HIGH (ref 35–144)
Bilirubin, Direct: 0.2 mg/dL (ref 0.0–0.2)
Globulin: 4.8 g/dL (calc) — ABNORMAL HIGH (ref 1.9–3.7)
Indirect Bilirubin: 0.4 mg/dL (calc) (ref 0.2–1.2)
Total Bilirubin: 0.6 mg/dL (ref 0.2–1.2)
Total Protein: 9.3 g/dL — ABNORMAL HIGH (ref 6.1–8.1)

## 2021-09-01 LAB — CBC
HCT: 46.6 % (ref 38.5–50.0)
Hemoglobin: 15.8 g/dL (ref 13.2–17.1)
MCH: 29.7 pg (ref 27.0–33.0)
MCHC: 33.9 g/dL (ref 32.0–36.0)
MCV: 87.6 fL (ref 80.0–100.0)
MPV: 12.4 fL (ref 7.5–12.5)
Platelets: 144 10*3/uL (ref 140–400)
RBC: 5.32 10*6/uL (ref 4.20–5.80)
RDW: 15 % (ref 11.0–15.0)
WBC: 7.4 10*3/uL (ref 3.8–10.8)

## 2021-09-01 LAB — LIVER FIBROSIS, FIBROTEST-ACTITEST
ALT: 22 U/L (ref 9–46)
Alpha-2-Macroglobulin: 484 mg/dL — ABNORMAL HIGH (ref 106–279)
Apolipoprotein A1: 138 mg/dL (ref 94–176)
Bilirubin: 0.6 mg/dL (ref 0.2–1.2)
Fibrosis Score: 0.93
GGT: 89 U/L — ABNORMAL HIGH (ref 3–85)
Haptoglobin: 17 mg/dL — ABNORMAL LOW (ref 43–212)
Necroinflammat ACT Score: 0.21
Reference ID: 4262583

## 2021-09-06 ENCOUNTER — Telehealth: Payer: Self-pay | Admitting: Family

## 2021-09-06 ENCOUNTER — Other Ambulatory Visit (HOSPITAL_COMMUNITY): Payer: Self-pay

## 2021-09-06 ENCOUNTER — Telehealth: Payer: Self-pay

## 2021-09-06 NOTE — Telephone Encounter (Signed)
[facility] Patient Advocate Encounter  Completed and sent MYABBVIE application for Mavyret for this patient who is uninsured.    Patient assistance phone number for follow up is [phone].   This encounter will be updated until final determination.   Briscoe Daniello, CPhT Specialty Pharmacy Patient Advocate [facility] Phone: [phone] Fax:  [phone]  

## 2021-09-06 NOTE — Telephone Encounter (Signed)
Jerome [name] has a fibrosis score of F4 with previous ultrasound from October showing no abnormalities. He has Genotype 1b with initial viral load of 5.76 million and will plan to proceed with Mavyret as planned.  ?

## 2021-09-08 ENCOUNTER — Telehealth: Payer: Self-pay

## 2021-09-08 NOTE — Telephone Encounter (Signed)
[facility] Patient Advocate Encounter ? ?Completed and sent MYABBVIE application for Mavyret for this patient who is uninsured.   ? ?Patient is approved 0314/23 through 03/10/2022. ? ?Medication will be delivered to the office on 09/15/21.  ?Patient lives in a facility so his Aide will bring him to the clinic on 09/16/21. I will set up his 1 month follow up appointment. ? ? ?Ileene Patrick, CPhT ?Specialty Pharmacy Patient Advocate ?[facility] ?Phone: [phone] ?Fax:  [phone]  ?

## 2021-09-15 ENCOUNTER — Telehealth: Payer: Self-pay

## 2021-09-15 NOTE — Telephone Encounter (Signed)
[facility] Patient Advocate Encounter ? ?Patient's medication (Mavyret) have been couriered to [facility] from [facility] and will be picked up . ? ?Patients Aide will pick up medication from the clinic and I will make a 1 month follow up appointment at that time. ? ?Clearance Coots , CPhT ?Specialty Pharmacy Patient Advocate ?[facility] ?Phone: [phone] ?Fax:  [phone]  ?

## 2021-09-18 ENCOUNTER — Other Ambulatory Visit (INDEPENDENT_AMBULATORY_CARE_PROVIDER_SITE_OTHER): Payer: Self-pay | Admitting: Primary Care

## 2021-09-20 ENCOUNTER — Encounter (HOSPITAL_COMMUNITY): Payer: Self-pay | Admitting: Emergency Medicine

## 2021-09-20 ENCOUNTER — Other Ambulatory Visit: Payer: Self-pay

## 2021-09-20 ENCOUNTER — Emergency Department (HOSPITAL_COMMUNITY)
Admission: EM | Admit: 2021-09-20 | Discharge: 2021-09-21 | Disposition: A | Discharge status: Home / Self Care | Payer: Medicaid Other | Attending: Emergency Medicine | Admitting: Emergency Medicine

## 2021-09-20 ENCOUNTER — Telehealth: Payer: Self-pay

## 2021-09-20 DIAGNOSIS — W01198A Fall on same level from slipping, tripping and stumbling with subsequent striking against other object, initial encounter: Secondary | ICD-10-CM | POA: Insufficient documentation

## 2021-09-20 DIAGNOSIS — M542 Cervicalgia: Secondary | ICD-10-CM | POA: Diagnosis present

## 2021-09-20 DIAGNOSIS — W19XXXA Unspecified fall, initial encounter: Secondary | ICD-10-CM

## 2021-09-20 DIAGNOSIS — R4182 Altered mental status, unspecified: Secondary | ICD-10-CM | POA: Insufficient documentation

## 2021-09-20 MED ORDER — MAVYRET 100-40 MG PO TABS: 3.0000 | ORAL_TABLET | Freq: Every day | ORAL | 1 refills | Status: AC

## 2021-09-20 NOTE — Telephone Encounter (Signed)
Routed to PCP. Please see pharmacy note. ?
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
## 2021-09-20 NOTE — Telephone Encounter (Signed)
Patient is approved to receive Mavyret x 8 weeks for chronic Hepatitis C infection. Counseled patient's legal guardian, Gordan Payment SW, to take all three tablets of Mavyret daily with food.  Counseled about the need to take all three tablets together and to not separate them out during the day. Encouraged to not to miss any doses and explained how their chance of cure could go down with each dose missed. Counseled patient on what to do if dose is missed - if it is closer to the missed dose take immediately; if closer to next dose then skip dose and take the next dose at the usual time. Counseled patient on common side effects such as headache, fatigue, and nausea and that these normally decrease with time. I reviewed patient medications and found no drug interactions (patient on max recommended Crestor dose of 10 mg). Discussed with patient that there are several drug interactions with Mavyret and instructed to call the clinic if he wishes to start a new medication during course of therapy. Also advised patient to call if he experiences any side effects. Patient will follow-up with Cassie in the pharmacy clinic on 10/14/2021. ? ?Valeda Malm, Pharm.D. ?PGY-1 Pharmacy Resident ?09/20/2021 11:26 AM ?

## 2021-09-21 ENCOUNTER — Ambulatory Visit (INDEPENDENT_AMBULATORY_CARE_PROVIDER_SITE_OTHER): Payer: Self-pay | Admitting: Primary Care

## 2021-09-21 ENCOUNTER — Emergency Department (HOSPITAL_COMMUNITY): Payer: Medicaid Other
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
## 2021-09-21 NOTE — ED Notes (Signed)
Called Pt for vitals no answer. 

## 2021-09-21 NOTE — ED Notes (Signed)
Pt called several times for vitals no answer. 

## 2021-09-23 ENCOUNTER — Emergency Department (HOSPITAL_COMMUNITY): Payer: Medicaid Other

## 2021-09-23 ENCOUNTER — Inpatient Hospital Stay (HOSPITAL_COMMUNITY)
Admission: EM | Admit: 2021-09-23 | Discharge: 2021-09-27 | DRG: 101 | Disposition: A | Discharge status: Home / Self Care | Payer: Medicaid Other | Attending: Family Medicine | Admitting: Family Medicine

## 2021-09-23 ENCOUNTER — Observation Stay (HOSPITAL_COMMUNITY): Payer: Medicaid Other

## 2021-09-23 DIAGNOSIS — Z87820 Personal history of traumatic brain injury: Secondary | ICD-10-CM

## 2021-09-23 DIAGNOSIS — R569 Unspecified convulsions: Secondary | ICD-10-CM | POA: Diagnosis not present

## 2021-09-23 DIAGNOSIS — G8384 Todd's paralysis (postepileptic): Secondary | ICD-10-CM

## 2021-09-23 DIAGNOSIS — E119 Type 2 diabetes mellitus without complications: Secondary | ICD-10-CM | POA: Diagnosis present

## 2021-09-23 DIAGNOSIS — F259 Schizoaffective disorder, unspecified: Secondary | ICD-10-CM | POA: Diagnosis present

## 2021-09-23 DIAGNOSIS — T426X6A Underdosing of other antiepileptic and sedative-hypnotic drugs, initial encounter: Secondary | ICD-10-CM | POA: Diagnosis present

## 2021-09-23 DIAGNOSIS — I10 Essential (primary) hypertension: Secondary | ICD-10-CM | POA: Diagnosis present

## 2021-09-23 DIAGNOSIS — Z5320 Procedure and treatment not carried out because of patient's decision for unspecified reasons: Secondary | ICD-10-CM | POA: Diagnosis not present

## 2021-09-23 DIAGNOSIS — R29810 Facial weakness: Secondary | ICD-10-CM | POA: Diagnosis present

## 2021-09-23 DIAGNOSIS — F17210 Nicotine dependence, cigarettes, uncomplicated: Secondary | ICD-10-CM | POA: Diagnosis present

## 2021-09-23 DIAGNOSIS — G40909 Epilepsy, unspecified, not intractable, without status epilepticus: Principal | ICD-10-CM | POA: Diagnosis present

## 2021-09-23 DIAGNOSIS — Z91138 Patient's unintentional underdosing of medication regimen for other reason: Secondary | ICD-10-CM

## 2021-09-23 DIAGNOSIS — N179 Acute kidney failure, unspecified: Secondary | ICD-10-CM | POA: Diagnosis present

## 2021-09-23 DIAGNOSIS — Z79899 Other long term (current) drug therapy: Secondary | ICD-10-CM

## 2021-09-23 DIAGNOSIS — E8729 Other acidosis: Secondary | ICD-10-CM

## 2021-09-23 DIAGNOSIS — B182 Chronic viral hepatitis C: Secondary | ICD-10-CM | POA: Diagnosis present

## 2021-09-23 DIAGNOSIS — I959 Hypotension, unspecified: Secondary | ICD-10-CM | POA: Diagnosis present

## 2021-09-23 DIAGNOSIS — R531 Weakness: Secondary | ICD-10-CM | POA: Diagnosis not present

## 2021-09-23 DIAGNOSIS — Z8615 Personal history of latent tuberculosis infection: Secondary | ICD-10-CM

## 2021-09-23 DIAGNOSIS — R414 Neurologic neglect syndrome: Secondary | ICD-10-CM

## 2021-09-23 DIAGNOSIS — G8194 Hemiplegia, unspecified affecting left nondominant side: Secondary | ICD-10-CM | POA: Diagnosis present

## 2021-09-23 DIAGNOSIS — Z23 Encounter for immunization: Secondary | ICD-10-CM

## 2021-09-23 DIAGNOSIS — Z20822 Contact with and (suspected) exposure to covid-19: Secondary | ICD-10-CM | POA: Diagnosis present

## 2021-09-23 DIAGNOSIS — Z7984 Long term (current) use of oral hypoglycemic drugs: Secondary | ICD-10-CM

## 2021-09-23 DIAGNOSIS — F1020 Alcohol dependence, uncomplicated: Secondary | ICD-10-CM | POA: Diagnosis present

## 2021-09-23 DIAGNOSIS — Y900 Blood alcohol level of less than 20 mg/100 ml: Secondary | ICD-10-CM | POA: Diagnosis present

## 2021-09-23 DIAGNOSIS — E872 Acidosis, unspecified: Secondary | ICD-10-CM | POA: Diagnosis present

## 2021-09-23 HISTORY — DX: Unspecified convulsions: R56.9

## 2021-09-23 LAB — I-STAT CHEM 8, ED
BUN: 11 mg/dL (ref 6–20)
Calcium, Ion: 1.12 mmol/L — ABNORMAL LOW (ref 1.15–1.40)
Chloride: 99 mmol/L (ref 98–111)
Creatinine, Ser: 1.5 mg/dL — ABNORMAL HIGH (ref 0.61–1.24)
Glucose, Bld: 198 mg/dL — ABNORMAL HIGH (ref 70–99)
HCT: 40 % (ref 39.0–52.0)
Hemoglobin: 13.6 g/dL (ref 13.0–17.0)
Potassium: 3.5 mmol/L (ref 3.5–5.1)
Sodium: 134 mmol/L — ABNORMAL LOW (ref 135–145)
TCO2: 17 mmol/L — ABNORMAL LOW (ref 22–32)

## 2021-09-23 LAB — COMPREHENSIVE METABOLIC PANEL
ALT: 27 U/L (ref 0–44)
AST: 45 U/L — ABNORMAL HIGH (ref 15–41)
Albumin: 3.7 g/dL (ref 3.5–5.0)
Alkaline Phosphatase: 101 U/L (ref 38–126)
Anion gap: 20 — ABNORMAL HIGH (ref 5–15)
BUN: 10 mg/dL (ref 6–20)
CO2: 16 mmol/L — ABNORMAL LOW (ref 22–32)
Calcium: 9.6 mg/dL (ref 8.9–10.3)
Chloride: 97 mmol/L — ABNORMAL LOW (ref 98–111)
Creatinine, Ser: 1.76 mg/dL — ABNORMAL HIGH (ref 0.61–1.24)
GFR, Estimated: 44 mL/min — ABNORMAL LOW (ref >60)
Glucose, Bld: 196 mg/dL — ABNORMAL HIGH (ref 70–99)
Potassium: 3.5 mmol/L (ref 3.5–5.1)
Sodium: 133 mmol/L — ABNORMAL LOW (ref 135–145)
Total Bilirubin: 1.4 mg/dL — ABNORMAL HIGH (ref 0.3–1.2)
Total Protein: <3 g/dL — ABNORMAL LOW (ref 6.5–8.1)

## 2021-09-23 LAB — ETHANOL: Alcohol, Ethyl (B): <10 mg/dL (ref <10)

## 2021-09-23 LAB — RAPID URINE DRUG SCREEN, HOSP PERFORMED
Amphetamines: NOT DETECTED
Barbiturates: NOT DETECTED
Benzodiazepines: POSITIVE — AB
Cocaine: NOT DETECTED
Opiates: NOT DETECTED
Tetrahydrocannabinol: NOT DETECTED

## 2021-09-23 LAB — DIFFERENTIAL
Abs Immature Granulocytes: 0.05 10*3/uL (ref 0.00–0.07)
Basophils Absolute: 0 10*3/uL (ref 0.0–0.1)
Basophils Relative: 0 %
Eosinophils Absolute: 0 10*3/uL (ref 0.0–0.5)
Eosinophils Relative: 0 %
Immature Granulocytes: 1 %
Lymphocytes Relative: 16 %
Lymphs Abs: 1.4 10*3/uL (ref 0.7–4.0)
Monocytes Absolute: 0.5 10*3/uL (ref 0.1–1.0)
Monocytes Relative: 6 %
Neutro Abs: 6.7 10*3/uL (ref 1.7–7.7)
Neutrophils Relative %: 77 %

## 2021-09-23 LAB — CBC
HCT: 39.4 % (ref 39.0–52.0)
Hemoglobin: 12.7 g/dL — ABNORMAL LOW (ref 13.0–17.0)
MCH: 30.2 pg (ref 26.0–34.0)
MCHC: 32.2 g/dL (ref 30.0–36.0)
MCV: 93.8 fL (ref 80.0–100.0)
Platelets: 281 10*3/uL (ref 150–400)
RBC: 4.2 MIL/uL — ABNORMAL LOW (ref 4.22–5.81)
RDW: 14.2 % (ref 11.5–15.5)
WBC: 8.6 10*3/uL (ref 4.0–10.5)
nRBC: 0 % (ref 0.0–0.2)

## 2021-09-23 LAB — CBG MONITORING, ED: Glucose-Capillary: 172 mg/dL — ABNORMAL HIGH (ref 70–99)

## 2021-09-23 LAB — APTT: aPTT: 29 seconds (ref 24–36)

## 2021-09-23 LAB — PROTIME-INR
INR: 1.1 (ref 0.8–1.2)
Prothrombin Time: 14.5 seconds (ref 11.4–15.2)

## 2021-09-23 LAB — RESP PANEL BY RT-PCR (FLU A&B, COVID) ARPGX2
Influenza A by PCR: NEGATIVE
Influenza B by PCR: NEGATIVE
SARS Coronavirus 2 by RT PCR: NEGATIVE

## 2021-09-23 LAB — BETA-HYDROXYBUTYRIC ACID: Beta-Hydroxybutyric Acid: 0.21 mmol/L (ref 0.05–0.27)

## 2021-09-23 LAB — VALPROIC ACID LEVEL: Valproic Acid Lvl: <10 ug/mL — ABNORMAL LOW (ref 50.0–100.0)

## 2021-09-23 MED ORDER — LEVETIRACETAM IN NACL 500 MG/100ML IV SOLN
500.0000 mg | Freq: Once | INTRAVENOUS | Status: AC
  Administered 2021-09-23: 500 mg via INTRAVENOUS
  Filled 2021-09-23: qty 100

## 2021-09-23 MED ORDER — ENOXAPARIN SODIUM 40 MG/0.4ML IJ SOSY
40.0000 mg | PREFILLED_SYRINGE | INTRAMUSCULAR | Status: DC
  Administered 2021-09-23 – 2021-09-26 (×3): 40 mg via SUBCUTANEOUS
  Filled 2021-09-23 (×3): qty 0.4

## 2021-09-23 MED ORDER — LEVETIRACETAM 500 MG PO TABS: 500.0000 mg | ORAL_TABLET | Freq: Two times a day (BID) | ORAL | Status: DC

## 2021-09-23 MED ORDER — LEVETIRACETAM IN NACL 1000 MG/100ML IV SOLN
1000.0000 mg | Freq: Once | INTRAVENOUS | Status: AC
  Administered 2021-09-23: 1000 mg via INTRAVENOUS
  Filled 2021-09-23: qty 100

## 2021-09-23 MED ORDER — GLECAPREVIR-PIBRENTASVIR 100-40 MG PO TABS
3.0000 | ORAL_TABLET | Freq: Every day | ORAL | Status: DC
  Administered 2021-09-25: 3 via ORAL
  Filled 2021-09-23 (×2): qty 1

## 2021-09-23 MED ORDER — FOLIC ACID 1 MG PO TABS
1.0000 mg | ORAL_TABLET | Freq: Every day | ORAL | Status: DC
  Administered 2021-09-25 – 2021-09-27 (×3): 1 mg via ORAL
  Filled 2021-09-23 (×3): qty 1

## 2021-09-23 MED ORDER — LORAZEPAM 2 MG/ML IJ SOLN: 1.0000 mg | Freq: Once | INTRAMUSCULAR | Status: DC

## 2021-09-23 MED ORDER — LEVETIRACETAM IN NACL 1500 MG/100ML IV SOLN
1500.0000 mg | Freq: Once | INTRAVENOUS | Status: DC
  Filled 2021-09-23: qty 100

## 2021-09-23 MED ORDER — POLYETHYLENE GLYCOL 3350 17 GM/SCOOP PO POWD
17.0000 g | Freq: Every day | ORAL | Status: DC
  Filled 2021-09-23: qty 255

## 2021-09-23 MED ORDER — LORAZEPAM 2 MG/ML IJ SOLN
2.0000 mg | Freq: Once | INTRAMUSCULAR | Status: DC
  Filled 2021-09-23: qty 1

## 2021-09-23 MED ORDER — HALOPERIDOL LACTATE 5 MG/ML IJ SOLN
5.0000 mg | Freq: Once | INTRAMUSCULAR | Status: DC
Start: 2021-09-23 — End: 2021-09-23
  Filled 2021-09-23: qty 1

## 2021-09-23 MED ORDER — GLIPIZIDE ER 10 MG PO TB24
10.0000 mg | ORAL_TABLET | Freq: Every day | ORAL | Status: DC
  Administered 2021-09-25 – 2021-09-27 (×3): 10 mg via ORAL
  Filled 2021-09-23 (×4): qty 1

## 2021-09-23 MED ORDER — CLONAZEPAM 0.5 MG PO TABS
0.5000 mg | ORAL_TABLET | Freq: Two times a day (BID) | ORAL | Status: DC
  Administered 2021-09-23: 0.5 mg via ORAL
  Filled 2021-09-23: qty 1

## 2021-09-23 MED ORDER — SODIUM CHLORIDE 0.9 % IV SOLN: 3000.0000 mg | Freq: Once | INTRAVENOUS | Status: DC

## 2021-09-23 MED ORDER — GABAPENTIN 100 MG PO CAPS
100.0000 mg | ORAL_CAPSULE | Freq: Three times a day (TID) | ORAL | Status: DC
  Administered 2021-09-23 – 2021-09-27 (×9): 100 mg via ORAL
  Filled 2021-09-23 (×9): qty 1

## 2021-09-23 MED ORDER — IOHEXOL 350 MG/ML SOLN
60.0000 mL | Freq: Once | INTRAVENOUS | Status: AC | PRN
  Administered 2021-09-23: 60 mL via INTRAVENOUS

## 2021-09-23 MED ORDER — ROSUVASTATIN CALCIUM 5 MG PO TABS
10.0000 mg | ORAL_TABLET | Freq: Every day | ORAL | Status: DC
  Administered 2021-09-25 – 2021-09-27 (×3): 10 mg via ORAL
  Filled 2021-09-23 (×3): qty 2
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
## 2021-09-23 NOTE — ED Triage Notes (Signed)
Pt had 4 seizures today. Received 5mg  versed with ems. ?

## 2021-09-23 NOTE — Progress Notes (Signed)
EEG complete - results pending. Video not working with study ?

## 2021-09-23 NOTE — ED Notes (Signed)
Pt refused lab draw even after education ?
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
## 2021-09-24 DIAGNOSIS — E113299 Type 2 diabetes mellitus with mild nonproliferative diabetic retinopathy without macular edema, unspecified eye: Secondary | ICD-10-CM | POA: Diagnosis not present

## 2021-09-24 DIAGNOSIS — N179 Acute kidney failure, unspecified: Secondary | ICD-10-CM | POA: Diagnosis not present

## 2021-09-24 DIAGNOSIS — R531 Weakness: Secondary | ICD-10-CM | POA: Diagnosis not present

## 2021-09-24 DIAGNOSIS — R569 Unspecified convulsions: Secondary | ICD-10-CM | POA: Diagnosis not present

## 2021-09-24 LAB — MAGNESIUM: Magnesium: 1.6 mg/dL — ABNORMAL LOW (ref 1.7–2.4)

## 2021-09-24 LAB — URINALYSIS, ROUTINE W REFLEX MICROSCOPIC
Bilirubin Urine: NEGATIVE
Glucose, UA: NEGATIVE mg/dL
Hgb urine dipstick: NEGATIVE
Ketones, ur: NEGATIVE mg/dL
Leukocytes,Ua: NEGATIVE
Nitrite: NEGATIVE
Protein, ur: NEGATIVE mg/dL
Specific Gravity, Urine: 1.01 (ref 1.005–1.030)
pH: 6 (ref 5.0–8.0)

## 2021-09-24 LAB — HEMOGLOBIN A1C
Hgb A1c MFr Bld: 5.4 % (ref 4.8–5.6)
Mean Plasma Glucose: 108.28 mg/dL

## 2021-09-24 MED ORDER — FLUPHENAZINE HCL 5 MG PO TABS
2.5000 mg | ORAL_TABLET | Freq: Every day | ORAL | Status: DC
  Filled 2021-09-24 (×3): qty 1

## 2021-09-24 MED ORDER — LORAZEPAM 2 MG/ML IJ SOLN
1.0000 mg | Freq: Two times a day (BID) | INTRAMUSCULAR | Status: DC
  Administered 2021-09-25 (×2): 1 mg via INTRAVENOUS
  Filled 2021-09-24 (×2): qty 1

## 2021-09-24 MED ORDER — VALPROATE SODIUM 100 MG/ML IV SOLN
1000.0000 mg | Freq: Once | INTRAVENOUS | Status: DC
  Filled 2021-09-24: qty 10

## 2021-09-24 MED ORDER — VALPROATE SODIUM 100 MG/ML IV SOLN
500.0000 mg | Freq: Two times a day (BID) | INTRAVENOUS | Status: DC
  Filled 2021-09-24: qty 5

## 2021-09-24 MED ORDER — FLUPHENAZINE HCL 2.5 MG/ML IJ SOLN
2.5000 mg | Freq: Every day | INTRAMUSCULAR | Status: DC
  Filled 2021-09-24: qty 1

## 2021-09-24 MED ORDER — LEVETIRACETAM IN NACL 500 MG/100ML IV SOLN: 500.0000 mg | Freq: Two times a day (BID) | INTRAVENOUS | Status: DC

## 2021-09-24 MED ORDER — VALPROATE SODIUM 100 MG/ML IV SOLN
250.0000 mg | Freq: Four times a day (QID) | INTRAVENOUS | Status: DC
  Administered 2021-09-25 – 2021-09-26 (×5): 250 mg via INTRAVENOUS
  Filled 2021-09-24 (×11): qty 2.5
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
## 2021-09-24 NOTE — Progress Notes (Addendum)
Brief neurology update ? ?Care d/w primary team. Patient refusing all meds and procedures. Psychiatry evaluated patient and has secured consent from legal guardian for IM over patient objection for prolixin trial. Per psych, patient has become irritable on keppra in the past. He has been restarted on depakote. VPA level was undetectable on admission. ? ?rEEG showed no ongoing seizure activity. Patient refusing MRI brain. ? ?Recommendations: ?- Agree with discontinuation of keppra ?- I have changed depakote dosing to 250mg  IV q 6 hrs. If patient becomes amenable to po, can transition to 500mg  DR sprinkles q 12 hrs. Recheck depakote level in 1 week. ? ?Neurology will be available as needed for questions going forward. ? ?Su Monks, MD ?[facility] ?[phone] ? ?If 7pm- 7am, please page neurology on call as listed in [place]. ? ?
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
## 2021-09-24 NOTE — Procedures (Signed)
Patient Name: Jerome [name]  ?MRN: [id number]  ?Epilepsy Attending: Lora Havens  ?Referring Physician/Provider: Corky Sox, MD ?Date: 09/23/2021 ?Duration: 30.54 mins ? ?Patient history: 61 year old male with generalized seizure-like activity and subsequent Todd's paralysis. EEG to evaluate for seizure. ? ?Level of alertness: Awake, asleep ? ?AEDs during EEG study: LEV, GBP ? ?Technical aspects: This EEG study was done with scalp electrodes positioned according to the 10-20 International system of electrode placement. Electrical activity was acquired at a sampling rate of 500Hz  and reviewed with a high frequency filter of 70Hz  and a low frequency filter of 1Hz . EEG data were recorded continuously and digitally stored.  ? ?Description: During awake state, no clear posterior dominant rhythm was seen. Sleep was characterized by vertex waves, sleep spindles (12 to 14 Hz), maximal frontocentral region.  EEG showed continuous rhythmic 3 to 6 Hz theta and delta slowing in right hemisphere which at times appears sharply contoured. Additionally there is 15 to 18 Hz generalized beta activity.  Sharp transients were noted in right frontocentral region.  Hyperventilation and photic stimulation were not performed.    ? ?ABNORMALITY ?- Continuous slow, right hemisphere ?- Excessive beta, generalized ? ?IMPRESSION: ?This study is suggestive of cortical dysfunction in right hemisphere likely secondary to underlying structural abnormality, postictal state. The excessive beta activity seen in the background is most likely due to the effect of benzodiazepine and is a benign EEG pattern. No seizures or definite epileptiform discharges were seen throughout the recording. ? ?Lora Havens  ? ?

## 2021-09-24 NOTE — Progress Notes (Signed)
FPTS Brief Progress Note ? ?S: Evaluated patient on night rounds.  Spoke with charge nurse who reported that the patient was still refusing his medications.  When I went to patient's room patient was lying in bed with covers over his head. ? ? ?O: ?BP (!) 89/60 (BP Location: Right Arm)   Pulse 85   Temp 98.1 ?F (36.7 ?C) (Oral)   Resp 20   SpO2 97%   ? ? ?A/P: ?Psychiatry was consulted today who recommended IM Prolixin trial.  This was ordered for 10 PM.  Neurology recommended changing Depakote to 250 mg IV every 6 hours if patient is amenable and can transition to oral when patient agrees to take oral meds.  Plan for recheck Depakote level in 1 week. ?- Orders reviewed. Labs for AM ordered, which was adjusted as needed.  ? ?Gifford Shave, MD ?09/24/2021, 9:35 PM ?PGY-3, [place] Night Resident  ?Please page [phone] with questions.  ? ? ?

## 2021-09-24 NOTE — Progress Notes (Signed)
This morning BP got 86/55 no s/s, after snacks and Po fluid came up to 108/60, on call MD notified. Patient is stable no seizure noted, will continue to monitor. ?

## 2021-09-24 NOTE — Progress Notes (Signed)
Patient is refusing all medications and nursing interventions. Patient refusing to go to MRI as well.  ?

## 2021-09-24 NOTE — Progress Notes (Addendum)
Patient keep saying he wants to go home, refused continuous pulse ox check, 100% in room air, no acute change noted. ?

## 2021-09-24 NOTE — Progress Notes (Signed)
Patient assessed at 7:45PM; offered his depakote meds. Patient kept blanket over his head, answered "do not bother me; leave me alone, I do not want my medicines, they make me go crazy". ? ?Orientation questions were asked, patient stated "I am not answering, you know where I am at and you know my name; if you insist, my name is Jerome [name]". Refused assessment; refused vital signs. ? ?@ 9:40PM; patient continued to refuse medications; states "I told you leave me alone, I will not take anything"; noted to become more irritated/angry. Will notify MD.  ?

## 2021-09-24 NOTE — Progress Notes (Signed)
MD Cresenzo at bedside; encouraged meds, vital signs and assessment, patient cont ot refuse. Stated "leave me (cursed) the alone". ? ?MD notified me that it is no longer necessary to notify them if patient refuses any meds for the rest of the shift.   ? ?Will continue to offer meds.  ?
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
## 2021-09-24 NOTE — Progress Notes (Signed)
Spoke with pt's legal guardian, Jerome [name] with updates on pt refusing treatment and exams. Also updated him that Depakote level was low and pt has not had Klonopin refilled since Feb. 2023 and that we are concerned pt is not getting all of his necessary medications at the group home. Jerome Bee was appreciative of the call and stated he will call the pt's hospital room and try to talk to him about being cooperative with exams and treatment.  ? ?Jerome Bee requests that we call him with any updates and if it is after 4:00 pm to call his cell phone which is listed in chart. ?

## 2021-09-24 NOTE — Progress Notes (Signed)
Page sent to MD Cresenzo,V re: patient cont to refuse meds. ?

## 2021-09-25 DIAGNOSIS — R531 Weakness: Secondary | ICD-10-CM

## 2021-09-25 DIAGNOSIS — Z5320 Procedure and treatment not carried out because of patient's decision for unspecified reasons: Secondary | ICD-10-CM | POA: Diagnosis not present

## 2021-09-25 DIAGNOSIS — I10 Essential (primary) hypertension: Secondary | ICD-10-CM | POA: Diagnosis present

## 2021-09-25 DIAGNOSIS — B182 Chronic viral hepatitis C: Secondary | ICD-10-CM | POA: Diagnosis present

## 2021-09-25 DIAGNOSIS — Y900 Blood alcohol level of less than 20 mg/100 ml: Secondary | ICD-10-CM | POA: Diagnosis present

## 2021-09-25 DIAGNOSIS — I959 Hypotension, unspecified: Secondary | ICD-10-CM | POA: Diagnosis present

## 2021-09-25 DIAGNOSIS — R414 Neurologic neglect syndrome: Secondary | ICD-10-CM | POA: Diagnosis present

## 2021-09-25 DIAGNOSIS — F17210 Nicotine dependence, cigarettes, uncomplicated: Secondary | ICD-10-CM | POA: Diagnosis present

## 2021-09-25 DIAGNOSIS — Z20822 Contact with and (suspected) exposure to covid-19: Secondary | ICD-10-CM | POA: Diagnosis present

## 2021-09-25 DIAGNOSIS — R569 Unspecified convulsions: Secondary | ICD-10-CM | POA: Diagnosis present

## 2021-09-25 DIAGNOSIS — G8384 Todd's paralysis (postepileptic): Secondary | ICD-10-CM | POA: Diagnosis present

## 2021-09-25 DIAGNOSIS — Z91138 Patient's unintentional underdosing of medication regimen for other reason: Secondary | ICD-10-CM | POA: Diagnosis not present

## 2021-09-25 DIAGNOSIS — E8729 Other acidosis: Secondary | ICD-10-CM

## 2021-09-25 DIAGNOSIS — E119 Type 2 diabetes mellitus without complications: Secondary | ICD-10-CM | POA: Diagnosis present

## 2021-09-25 DIAGNOSIS — G40909 Epilepsy, unspecified, not intractable, without status epilepticus: Secondary | ICD-10-CM | POA: Diagnosis present

## 2021-09-25 DIAGNOSIS — R29810 Facial weakness: Secondary | ICD-10-CM | POA: Diagnosis present

## 2021-09-25 DIAGNOSIS — Z79899 Other long term (current) drug therapy: Secondary | ICD-10-CM | POA: Diagnosis not present

## 2021-09-25 DIAGNOSIS — Z8615 Personal history of latent tuberculosis infection: Secondary | ICD-10-CM | POA: Diagnosis not present

## 2021-09-25 DIAGNOSIS — F259 Schizoaffective disorder, unspecified: Secondary | ICD-10-CM | POA: Diagnosis present

## 2021-09-25 DIAGNOSIS — Z23 Encounter for immunization: Secondary | ICD-10-CM | POA: Diagnosis not present

## 2021-09-25 DIAGNOSIS — G8194 Hemiplegia, unspecified affecting left nondominant side: Secondary | ICD-10-CM | POA: Diagnosis present

## 2021-09-25 DIAGNOSIS — E872 Acidosis, unspecified: Secondary | ICD-10-CM | POA: Diagnosis present

## 2021-09-25 DIAGNOSIS — Z87820 Personal history of traumatic brain injury: Secondary | ICD-10-CM | POA: Diagnosis not present

## 2021-09-25 DIAGNOSIS — N179 Acute kidney failure, unspecified: Secondary | ICD-10-CM | POA: Diagnosis present

## 2021-09-25 DIAGNOSIS — Z7984 Long term (current) use of oral hypoglycemic drugs: Secondary | ICD-10-CM | POA: Diagnosis not present

## 2021-09-25 DIAGNOSIS — F1020 Alcohol dependence, uncomplicated: Secondary | ICD-10-CM | POA: Diagnosis present

## 2021-09-25 DIAGNOSIS — T426X6A Underdosing of other antiepileptic and sedative-hypnotic drugs, initial encounter: Secondary | ICD-10-CM | POA: Diagnosis present

## 2021-09-25 LAB — BASIC METABOLIC PANEL
Anion gap: 9 (ref 5–15)
BUN: 7 mg/dL (ref 6–20)
CO2: 25 mmol/L (ref 22–32)
Calcium: 9.6 mg/dL (ref 8.9–10.3)
Chloride: 105 mmol/L (ref 98–111)
Creatinine, Ser: 0.88 mg/dL (ref 0.61–1.24)
GFR, Estimated: >60 mL/min (ref >60)
Glucose, Bld: 138 mg/dL — ABNORMAL HIGH (ref 70–99)
Potassium: 3.7 mmol/L (ref 3.5–5.1)
Sodium: 139 mmol/L (ref 135–145)

## 2021-09-25 LAB — MAGNESIUM: Magnesium: 1.5 mg/dL — ABNORMAL LOW (ref 1.7–2.4)

## 2021-09-25 MED ORDER — FLUPHENAZINE HCL 2.5 MG PO TABS
2.5000 mg | ORAL_TABLET | Freq: Every day | ORAL | Status: DC
  Filled 2021-09-25: qty 1

## 2021-09-25 MED ORDER — FLUPHENAZINE HCL 2.5 MG/ML IJ SOLN
2.5000 mg | Freq: Every day | INTRAMUSCULAR | Status: DC
  Filled 2021-09-25: qty 1

## 2021-09-25 MED ORDER — MAGNESIUM SULFATE IN D5W 1-5 GM/100ML-% IV SOLN
1.0000 g | Freq: Once | INTRAVENOUS | Status: AC
  Administered 2021-09-25: 1 g via INTRAVENOUS
  Filled 2021-09-25: qty 100

## 2021-09-25 MED ORDER — FLUPHENAZINE HCL 5 MG PO TABS
2.5000 mg | ORAL_TABLET | Freq: Every day | ORAL | Status: DC
  Administered 2021-09-25 – 2021-09-27 (×3): 2.5 mg via ORAL
  Filled 2021-09-25 (×4): qty 1

## 2021-09-25 NOTE — Progress Notes (Addendum)
Family Medicine Teaching Service ?Daily Progress Note ?Intern Pager: [phone] ? ?Patient name: Jerome [name] Medical record number: [id number] ?Date of birth: Mar 19, 1961 Age: 61 y.o. Gender: male ? ?Primary Care Provider: Kerin Perna, NP ?Consultants: Neuro, psych ?Code Status: Full ? ?Pt Overview and Major Events to Date:  ?3/30-Admitted ? ?Assessment and Plan: ?Jerome [name] is a 61 y.o. male presenting with seizure-like activity . PMH is significant for TBI with SAH, HTN, T2DM, schizophrenia, latent TB.  Due to prior TBI with [facility] in 2022, patient deemed not to have capacity and has assigned legal guardian ?  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 ?- Neurology available for questions ?- Continue Depakote until therapeutic ?- Depakote level in 1 week ? ?Schizophrenia/schizoaffective disorder ?- Psych following ?- Prolixin 5 mg p.o. or IM for psychosis ?  ?Hypomagnesemia ?1.8 today.  ?-No need to replete ? ?AKI, resolved ?Has good UOP.  ?  ?History of alcohol and tobacco use ?- CIWA's previously ordered but patient uncooperative so discontinued. He does not appear to be withdrawing at this time.  ? ?Chronic hepatitis C ?- Group home to bring [place] per H&P ? ?Type 2 diabetes ?- Continue home glipizide and gabapentin ?- Continue to hold metformin ? ?Hypertension ?Normotensive.  ?- Continue to closely monitor blood pressures ?- Continue to hold all home medications ?  ?FEN/GI: Regular diet ?PPx: Lovenox ?Dispo: Back to ALF tomorrow. Barriers include continued psychiatric treatment.  ? ?Subjective:  ?No acute events overnight. No concerns.  ? ?Objective: ?Temp:  [98.9 ?F (37.2 ?C)-99.5 ?F (37.5 ?C)] 99.5 ?F (37.5 ?C) (04/01 1636) ?Pulse Rate:  [78-87] 87 (04/01 1636) ?Resp:  [17-20] 17 (04/01 1636) ?BP: (103-109)/(70-76) 103/70 (04/01 1636) ?SpO2:  [96 %-97 %] 96 % (04/01 1636) ? ?Physical Exam: ?General: Appears sleepy and withdrawn, no acute distress. Age appropriate. ?Cardiac:  RRR, normal heart sounds, no murmurs ?Respiratory: CTAB, normal effort ?Extremities: No edema or cyanosis. ?Skin: Warm and dry, no rashes noted ?Neuro: alert  ?Psych: withdrawn ? ?Laboratory: ?Recent Labs  ?Lab 09/23/21 ?1620 09/23/21 ?1626  ?WBC 8.6  --   ?HGB 12.7* 13.6  ?HCT 39.4 40.0  ?PLT 281  --   ? ?Recent Labs  ?Lab 09/23/21 ?1620 09/23/21 ?1626 09/25/21 ?0946  ?NA 133* 134* 139  ?K 3.5 3.5 3.7  ?CL 97* 99 105  ?CO2 16*  --  25  ?BUN 10 11 7   ?CREATININE 1.76* 1.50* 0.88  ?CALCIUM 9.6  --  9.6  ?PROT <3.0*  --   --   ?BILITOT 1.4*  --   --   ?ALKPHOS 101  --   --   ?ALT 27  --   --   ?AST 45*  --   --   ?GLUCOSE 196* 198* 138*  ? ? ?Imaging/Diagnostic Tests: ?No new imaging.  ? ?Gerlene Fee, DO ?09/25/2021, 10:56 PM ?PGY-3, [place] ?[place] Intern pager: [phone], text pages welcome ? ?

## 2021-09-25 NOTE — Progress Notes (Signed)
Patient reapproached if willing to take meds- continued to refuse. Stated "how many times I have to tell you to leave me alone". Explained necessity of meds; cont to refuse.  ?

## 2021-09-25 NOTE — Progress Notes (Signed)
FPTS Brief Progress Note ? ?S: Sitting in bed, fixing his sheets.  ? ? ?O: ?BP 103/70 (BP Location: Right Arm)   Pulse 87   Temp 99.5 ?F (37.5 ?C) (Oral)   Resp 17   SpO2 96%   ?General: Appears well, no acute distress. Age appropriate. ?Respiratory: normal effort ? 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 Labs for AM ordered, which was adjusted as needed.  ?- Plan for discharge 4/3 ? ?Lavonda Jumbo, DO ?09/25/2021, 9:19 PM ?PGY-3, [facility] Night Resident  ?Please page [phone] with questions.  ? ? ?

## 2021-09-25 NOTE — Progress Notes (Signed)
Patient offered medication; continued to refuse.  ?

## 2021-09-25 NOTE — Progress Notes (Signed)
Received call from pharmacy that patient's Glecaprevir-Pibrentasvir 100-40 MG Tabs was a patient supplied medication that needs to be sent to pharmacy for distribution.   No additional doses of this medication provided by patient's guardian and/or group home.  Called Caryn Bee Mbumina and discussed need for additional patient supplied medication.   Caryn Bee advised he will call the group home to request additional doses be brought to the hospital.  ?

## 2021-09-25 NOTE — Progress Notes (Signed)
Patient refused all medications this entire shift; refused vital signs and assessment.  ? ?Patient used urinal to void; independently.  ?
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
## 2021-09-26 ENCOUNTER — Encounter (HOSPITAL_COMMUNITY): Payer: Self-pay | Admitting: Student

## 2021-09-26 ENCOUNTER — Other Ambulatory Visit: Payer: Self-pay

## 2021-09-26 LAB — MAGNESIUM: Magnesium: 1.8 mg/dL (ref 1.7–2.4)

## 2021-09-26 MED ORDER — MAGNESIUM SULFATE 2 GM/50ML IV SOLN
2.0000 g | Freq: Once | INTRAVENOUS | Status: DC
  Administered 2021-09-26: 2 g via INTRAVENOUS
  Filled 2021-09-26: qty 50

## 2021-09-26 MED ORDER — PNEUMOCOCCAL 20-VAL CONJ VACC 0.5 ML IM SUSY
0.5000 mL | PREFILLED_SYRINGE | INTRAMUSCULAR | Status: AC
  Administered 2021-09-27: 0.5 mL via INTRAMUSCULAR
  Filled 2021-09-26: qty 0.5

## 2021-09-26 MED ORDER — CLONAZEPAM 0.5 MG PO TABS
0.5000 mg | ORAL_TABLET | Freq: Two times a day (BID) | ORAL | Status: DC
  Administered 2021-09-26 – 2021-09-27 (×3): 0.5 mg via ORAL
  Filled 2021-09-26 (×3): qty 1

## 2021-09-26 MED ORDER — GLECAPREVIR-PIBRENTASVIR 100-40 MG PO TABS
3.0000 | ORAL_TABLET | Freq: Every day | ORAL | Status: DC
  Administered 2021-09-26 – 2021-09-27 (×2): 3 via ORAL
  Filled 2021-09-26 (×2): qty 1

## 2021-09-26 MED ORDER — DIVALPROEX SODIUM 125 MG PO CSDR
500.0000 mg | DELAYED_RELEASE_CAPSULE | Freq: Two times a day (BID) | ORAL | Status: DC
  Administered 2021-09-26 – 2021-09-27 (×2): 500 mg via ORAL
  Filled 2021-09-26 (×2): qty 4

## 2021-09-26 NOTE — Progress Notes (Signed)
Patient supplied medication given to pharmacy. Copy of form placed in chart. Medication glecaprevir-pibrentasvir quantity 42 tabs. ?Oliver Barre, RN  ? ?

## 2021-09-26 NOTE — Plan of Care (Signed)

## 2021-09-27 ENCOUNTER — Encounter (HOSPITAL_COMMUNITY): Payer: Self-pay | Admitting: Student

## 2021-09-27 ENCOUNTER — Other Ambulatory Visit (HOSPITAL_COMMUNITY): Payer: Self-pay

## 2021-09-27 LAB — BASIC METABOLIC PANEL
Anion gap: 9 (ref 5–15)
BUN: 9 mg/dL (ref 6–20)
CO2: 23 mmol/L (ref 22–32)
Calcium: 9.3 mg/dL (ref 8.9–10.3)
Chloride: 103 mmol/L (ref 98–111)
Creatinine, Ser: 0.91 mg/dL (ref 0.61–1.24)
GFR, Estimated: >60 mL/min (ref >60)
Glucose, Bld: 77 mg/dL (ref 70–99)
Potassium: 3.7 mmol/L (ref 3.5–5.1)
Sodium: 135 mmol/L (ref 135–145)

## 2021-09-27 LAB — MAGNESIUM: Magnesium: 1.7 mg/dL (ref 1.7–2.4)

## 2021-09-27 MED ORDER — FLUPHENAZINE DECANOATE 25 MG/ML IJ SOLN: 12.5000 mg | INTRAMUSCULAR | Status: AC

## 2021-09-27 MED ORDER — FLUPHENAZINE HCL 2.5 MG PO TABS
2.5000 mg | ORAL_TABLET | Freq: Every day | ORAL | 0 refills | Status: AC
Start: 2021-09-28 — End: 2021-10-28

## 2021-09-27 MED ORDER — FLUPHENAZINE DECANOATE 25 MG/ML IJ SOLN: 12.5000 mg | INTRAMUSCULAR | Status: DC

## 2021-09-27 MED ORDER — DIVALPROEX SODIUM 125 MG PO CSDR
500.0000 mg | DELAYED_RELEASE_CAPSULE | Freq: Two times a day (BID) | ORAL | 0 refills | Status: DC
  Filled 2021-09-27: qty 240, 30d supply, fill #0

## 2021-09-27 MED ORDER — FLUPHENAZINE HCL 2.5 MG PO TABS
2.5000 mg | ORAL_TABLET | Freq: Every day | ORAL | 0 refills | Status: DC
  Filled 2021-09-27: qty 30, 30d supply, fill #0

## 2021-09-27 NOTE — Plan of Care (Signed)
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
## 2021-09-27 NOTE — Progress Notes (Signed)
FPTS Brief Progress Note ? ?S:Patient sleeping comfortably ? ? ?O: ?BP 115/72 (BP Location: Right Arm)   Pulse 74   Temp 98.1 ?F (36.7 ?C) (Oral)   Resp 16   Ht 5\' 10"  (1.778 m)   Wt 78 kg   SpO2 94%   BMI 24.67 kg/m?   ? ? ?A/P: ?- Plans per day team ?- Orders reviewed. Labs for AM ordered, which was adjusted as needed.  ? ? ? , MD ?09/27/2021, 12:42 AM ?PGY-1, [facility] Night Resident  ?Please page [phone] with questions.  ? ? ?

## 2021-09-27 NOTE — Progress Notes (Cosign Needed Addendum)
Family Medicine Teaching Service ?Daily Progress Note ?Intern Pager: [phone] ? ?Patient name: Jerome [name] Medical record number: [id number] ?Date of birth: 1960/09/03 Age: 61 y.o. Gender: male ? ?Primary Care Provider: Grayce Sessions, NP ?Consultants: Neurology, Psychiatry ?Code Status: full ? ?Pt Overview and Major Events to Date:  ?3/30-admitted ? ? ?Assessment and Plan: ?Jerome [name] is a 61 y.o. male presenting with seizure-like activity . PMH is significant for TBI with SAH, HTN, T2DM, schizophrenia, latent TB.  Due to prior TBI with [facility] in 2022, patient deemed not to have capacity and has assigned legal guardian ?  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 ?Schizophrenia/Schizoaffective Disorder ?Plan for transition to LAI 1st dose starting today. Spoke with guardian regarding r/b/se and agreeable to plan. ?-Psych following, greatly appreciate recommendations ?-Prolixin 2.5 mg qd ?-Prolixin decanoate 12.5 mg q2 weeks (need 30 day oral bridge) ? ?Hypomagnesemia, resolved ?Mg 1.8>1.7. May consider repleting prior to d/c due to hx of seizure. ?-No need to replete ? ?AKI, resolved ?Has good UOP ? ?History of alcohol and tobacco use ?CIWA Discontinued. No obvious tremors noted.  ? ?Chronic hepatitis C ?-Continue Mavyret ? ?Type 2 diabetes ?- Continue home glipizide and gabapentin ?- Continue to hold metformin ? ?Hypertension ?Normotensive.  ?- Continue to closely monitor blood pressures ?- Continue to hold all home medications ? ? ?FEN/GI: Regular ?PPx: Lovenox ?Dispo: Group home today. Barriers include ongoing medical treatment.  ? ?Subjective:  ?Patient seen and assessed at bedside. Had bedcover over head but willingly lowered it. Reports doing well today. No problems eating or sleeping. Denies SI/HI/AVH. Ready to return to group home. Amenable to continue medications at this time. ? ?Objective: ?Temp:  [97.8 ?F  (36.6 ?C)-99.1 ?F (37.3 ?C)] 98.4 ?F (36.9 ?C) (04/03 2542) ?Pulse Rate:  [74-82] 74 (04/03 0807) ?Resp:  [16-20] 20 (04/03 7062) ?BP: (101-115)/(68-80) 113/74 (04/03 3762) ?SpO2:  [94 %-98 %] 97 % (04/03 0807) ?Physical Exam: ?General: resting comfortably. NAD ?Cardiovascular: RRR ?Respiratory: CTAB ?Abdomen: soft, nontender to palpation ?Extremities: able to move all extremities ? ?Laboratory: ?Recent Labs  ?Lab 09/23/21 ?1620 09/23/21 ?1626  ?WBC 8.6  --   ?HGB 12.7* 13.6  ?HCT 39.4 40.0  ?PLT 281  --   ? ?Recent Labs  ?Lab 09/23/21 ?1620 09/23/21 ?1626 09/25/21 ?8315 09/27/21 ?0324  ?NA 133* 134* 139 135  ?K 3.5 3.5 3.7 3.7  ?CL 97* 99 105 103  ?CO2 16*  --  25 23  ?BUN 10 11 7 9   ?CREATININE 1.76* 1.50* 0.88 0.91  ?CALCIUM 9.6  --  9.6 9.3  ?PROT <3.0*  --   --   --   ?BILITOT 1.4*  --   --   --   ?ALKPHOS 101  --   --   --   ?ALT 27  --   --   --   ?AST 45*  --   --   --   ?GLUCOSE 196* 198* 138* 77  ? ? ? ? ?Imaging/Diagnostic Tests: ?No results found. ? ? , MD ?09/27/2021, 9:00 AM ?PGY-1, [facility] ?FPTS Intern pager: [phone], text pages welcome ?

## 2021-09-27 NOTE — Discharge Instructions (Addendum)
Please come to [facility] (this facility) during walk in hours for appointment with psychiatrist for further medication management and for therapists for therapy.  ? ?Walk in hours are 8-11 AM Monday, Wednesday, Thursday, Friday. ?Therapy walk in hours are Monday-Wednesday 8 AM-1PM. It is first come, first -serve; it is best to arrive by 7:00 AM.  ? ?When you arrive please go upstairs for your appointment. If you are unsure of where to go, inform the front desk that you are here for a walk in appointment and they will assist you with directions upstairs. ? ?Address:  ?[address], in [place], [place] ?Ph: [phone]  ?

## 2021-09-27 NOTE — Progress Notes (Signed)
Order to discharge pt home.  Discharge instructions/AVS given to legal guardian, Caryn Bee - education provided as needed.  Medications from [facility] and home medications given to [place] - Pt advised to call PCP and/or come back to the hospital if there are any problems. Pt and Caryn Bee verbalized understanding.    ?

## 2021-09-27 NOTE — TOC Transition Note (Signed)
Transition of Care (TOC) - CM/SW Discharge Note ? ? ?Patient Details  ?Name: Jerome [name] ?MRN: [id number] ?Date of Birth: August 17, 1960 ? ?Transition of Care [facility]) CM/SW Contact:  ?Baldemar Lenis, LCSW ?Phone Number: ?09/27/2021, 4:10 PM ? ? ?Clinical Narrative:   CSW notified by MD of plan to return to group home today. CSW notified legal guardian, Caryn Bee, and spoke with group home owner as well. Transport to be provided by legal guardian, Caryn Bee. CSW sent discharge information to both guardian and group home, and confirmed medications delivered by [facility]. RNCM completed MATCH for medications. No other TOC needs at this time. ? ? ? ?Final next level of care: Group Home ?Barriers to Discharge: Barriers Resolved ? ? ?Patient Goals and CMS Choice ?Patient states their goals for this hospitalization and ongoing recovery are:: patient unable to participate in goal setting, has legal guardian ?CMS Medicare.gov Compare Post Acute Care list provided to:: Legal Guardian ?Choice offered to / list presented to : [facility] POA / Guardian ? ?Discharge Placement ?  ?           ?  ?Patient to be transferred to facility by: Facility/Guardian ?Name of family member notified: Caryn Bee ?Patient and family notified of of transfer: 09/27/21 ? ?Discharge Plan and Services ?  ?  ?           ?  ?  ?  ?  ?  ?  ?  ?  ?  ?  ? ?Social Determinants of Health (SDOH) Interventions ?  ? ? ?Readmission Risk Interventions ?   ? View : No data to display.  ?  ?  ?  ? ? ? ? ? ?

## 2021-09-28 ENCOUNTER — Telehealth: Payer: Self-pay

## 2021-09-28 NOTE — Telephone Encounter (Signed)
Transition Care Management Follow-up Telephone Call ? ?Call completed with Caryn Bee Mbumina- DSS/Legal Guardian and Doristine Mango- owner of [facility].[phone])  ?Date of discharge and from where: 09/27/2021, [facility]  ?How have you been since you were released from the hospital? Caryn Bee stated that the patient is doing well. No seizures since returning back to the group home.  ?Any questions or concerns? No ?Regarding behavioral health follow up, Doristine Mango explained that he will contact [facility] provider to manage patient's behavioral health medication.  Per Doristine Mango, Mr Tawni Levy will come to the group home to assess patient.  ? ?Items Reviewed: ?Did the pt receive and understand the discharge instructions provided?  The group home owner has discharge instructions.  ?Medications obtained and verified? Yes - per Caryn Bee, the group home has all of the medications.  ?Other? No  ?Any new allergies since your discharge? No  ?Dietary orders reviewed? No ?Do you have support at home? Yes  - he lives in a group home.  ? ?Home Care and Equipment/Supplies: ?Were home health services ordered? no ?If so, what is the name of the agency? N/a  ?Has the agency set up a time to come to the patient's home? not applicable ?Were any new equipment or medical supplies ordered?  No ?What is the name of the medical supply agency? N/a ?Were you able to get the supplies/equipment? not applicable ?Do you have any questions related to the use of the equipment or supplies? No ? ?Functional Questionnaire: (I = Independent and D = Dependent) ?ADLs: ambulates with walker.  The group home staff manages his medications.  He is independent with other ADLs. ? ? ?Follow up appointments reviewed: ? ?PCP Hospital f/u appt confirmed? Yes  Scheduled to see Gwinda Passe, NP - 10/12/2021.   ?Specialist Hospital f/u appt confirmed? Yes  Scheduled to see [facility] - 10/14/2021. ?Are transportation arrangements needed? No  - the  group home provides transportation  ?If their condition worsens, is the pt aware to call PCP or go to the Emergency Dept.? Yes ?Was the patient provided with contact information for the PCP's office or ED? Yes ?Was to pt encouraged to call back with questions or concerns? Yes ? ?

## 2021-10-12 ENCOUNTER — Encounter (INDEPENDENT_AMBULATORY_CARE_PROVIDER_SITE_OTHER): Payer: Self-pay | Admitting: Primary Care

## 2021-10-12 ENCOUNTER — Ambulatory Visit (INDEPENDENT_AMBULATORY_CARE_PROVIDER_SITE_OTHER): Payer: Medicaid Other | Admitting: Primary Care

## 2021-10-12 VITALS — BP 130/90 | HR 108 | Temp 97.5°F | Ht 70.0 in | Wt 167.8 lb

## 2021-10-12 DIAGNOSIS — R531 Weakness: Secondary | ICD-10-CM | POA: Diagnosis not present

## 2021-10-12 DIAGNOSIS — E119 Type 2 diabetes mellitus without complications: Secondary | ICD-10-CM | POA: Diagnosis not present

## 2021-10-12 NOTE — Progress Notes (Signed)
?Jerome [name] ? ? ?Subjective:  ? Jerome [name] is a 61 y.o. male presents for hospital follow up and establish care. Admit date to the hospital was 09/23/21, patient was discharged from the hospital on 09/27/21, patient was admitted for:  from group home after 3 episodes of generalized seizure-like activity. 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 CT head unremarkable. Patient would like a brace for his right hand. States hand goes numb when he lays down. He is currently using a stress ball for relief. ? ?Past Medical History:  ?Diagnosis Date  ? Seizure ([place])   ?  ? ?No Known Allergies ? ?  ?Current Outpatient Medications on File Prior to Visit  ?Medication Sig Dispense Refill  ? Accu-Chek Softclix Lancets lancets Use as directed up to 4 times daily 100 each 5  ? blood glucose meter kit and supplies KIT Dispense based on patient and insurance preference. Use up to four times daily as directed. 1 each 0  ? Blood Glucose Monitoring Suppl (ACCU-CHEK GUIDE) w/Device KIT Use as directed 1 kit 0  ? clonazePAM (KLONOPIN) 0.5 MG tablet Take 1 tablet (0.5 mg total) by mouth 2 (two) times daily. 60 tablet 0  ? diclofenac Sodium (VOLTAREN) 1 % GEL Apply 4 g topically 4 (four) times daily. 350 g 0  ? divalproex (DEPAKOTE SPRINKLE) 125 MG capsule Take 4 capsules (500 mg total) by mouth every 12 (twelve) hours. 240 capsule 0  ? famotidine (PEPCID) 40 MG tablet Take 1 tablets as needed for heartburn or indigestion 60 tablet 1  ? fluPHENAZine (PROLIXIN) 2.5 MG tablet Take 1 tablet (2.5 mg total) by mouth daily. 30 tablet 0  ? fluPHENAZine decanoate (PROLIXIN) 25 MG/ML injection Inject 0.5 mLs (12.5 mg total) into the muscle every 14 (fourteen) days. 5 mL   ? folic acid (FOLVITE) 1 MG tablet Take 1 tablet (1 mg total) by mouth daily. 90 tablet 1  ? gabapentin (NEURONTIN) 100 MG capsule Take 1 capsule (100 mg total) by  mouth 3 (three) times daily. 90 capsule 0  ? Glecaprevir-Pibrentasvir (MAVYRET) 100-40 MG TABS Take 3 tablets by mouth daily with breakfast. 84 tablet 1  ? glucose blood (ACCU-CHEK GUIDE) test strip Use as instructed up to 4 times daily 100 each 12  ? magnesium oxide (MAG-OX) 400 (240 Mg) MG tablet TAKE 2 TABLETS BY MOUTH 2 TIMES DAILY (Patient taking differently: Take 400 mg by mouth daily.) 120 tablet 1  ? metFORMIN (GLUCOPHAGE) 1000 MG tablet Take 1 tablet (1,000 mg total) by mouth 2 (two) times daily with a meal. 60 tablet 1  ? polyethylene glycol powder (GLYCOLAX/MIRALAX) 17 GM/SCOOP powder Take 1 capful (17 g) with water by mouth daily. 510 g 6  ? rosuvastatin (CRESTOR) 10 MG tablet Take 1 tablet (10 mg total) by mouth daily. (Patient not taking: Reported on 08/26/2021) 30 tablet 0  ? ?No current facility-administered medications on file prior to visit.  ? ?Review of System: ?Comprehensive ROS Pertinent positive and negative noted in HPI   ? ?Objective:  ?BP (!) 150/98 (BP Location: Right Arm, Patient Position: Sitting, Cuff Size: Normal)   Pulse (!) 110   Temp (!) 97.5 ?F (36.4 ?C) (Oral)   Ht $R'5\' 10"'Dl$  (1.778 m)   Wt 167 lb 12.8 oz (76.1 kg)   SpO2 97%   BMI 24.08 kg/m?  ? ?Filed Weights  ? 10/12/21 1021  ?Weight: 167  lb 12.8 oz (76.1 kg)  ? ? ?Physical Exam: ?General Appearance: Well nourished, in no apparent distress. ?Eyes: PERRLA, EOMs, conjunctiva no swelling or erythema ?Sinuses: No Frontal/maxillary tenderness ?ENT/Mouth: Ext aud canals clear, TMs without erythema, bulging. Hearing normal.  ?Neck: Supple, thyroid normal.  ?Respiratory: Respiratory effort normal, BS equal bilaterally without rales, rhonchi, wheezing or stridor.  ?Cardio: RRR with no MRGs. Brisk peripheral pulses without edema.  ?Abdomen: Soft, + BS.  Non tender, no guarding, rebound, hernias, masses. ?Lymphatics: Non tender without lymphadenopathy.  ?Musculoskeletal: Full ROM, 35 strength, normal gait.  ?Skin: Warm, dry without  rashes, lesions, ecchymosis.  ?Neuro: Cranial nerves intact. Normal muscle tone, no cerebellar symptoms. Sensation intact.  ?Psych: Awake and oriented X 3, normal affect, Insight and Judgment appropriate.  ? ? ?Assessment:  ?[place] was seen today for hospitalization follow-up. ? ?Diagnoses and all orders for this visit: ? ?New onset type 2 diabetes mellitus ([place]) ?Monitor foods that are high in carbohydrates are the following rice, potatoes, breads, sugars, and pastas.  Reduction in the intake (eating) will assist in lowering your blood sugars. Currently on metformin 1077m bid  ?-     Lipid Panel ? ? right-sided weakness ?Hx of head trauma/ seizures  ? hand goes numb especially when trying to sleep  ?Ambulatory referral to Neurology ? ?This note has been created with DSurveyor, quantity Any transcriptional errors are unintentional.  ? ?MKerin Perna NP ?10/12/2021, 10:24 AM ?   ?

## 2021-10-12 NOTE — Progress Notes (Signed)
Patient would like a brace for his right hand. States hand goes numb when he lays down. He is currently using a stress ball for relief ?

## 2021-10-13 ENCOUNTER — Telehealth (INDEPENDENT_AMBULATORY_CARE_PROVIDER_SITE_OTHER): Payer: Self-pay

## 2021-10-13 LAB — LIPID PANEL
Chol/HDL Ratio: 3.2 ratio (ref 0.0–5.0)
Cholesterol, Total: 118 mg/dL (ref 100–199)
HDL: 37 mg/dL — ABNORMAL LOW (ref >39)
LDL Chol Calc (NIH): 63 mg/dL (ref 0–99)
Triglycerides: 96 mg/dL (ref 0–149)
VLDL Cholesterol Cal: 18 mg/dL (ref 5–40)

## 2021-10-13 NOTE — Telephone Encounter (Signed)
Jerome [name](patients legal guardian) returned call. He was provided with results. He repeated them back to ensure accuracy. He will relay results to patient. Jerome [name], CMA  ?

## 2021-10-13 NOTE — Telephone Encounter (Signed)
-----   Message from Grayce Sessions, NP sent at 10/13/2021  9:01 AM EDT ----- ?Cholesterol is good just needs to increase exercise and continue atorvastatin 10mg  for heart protection.  Healthy lifestyle diet of fruits vegetables fish nuts whole grains and low saturated fat . Foods high in cholesterol or liver, fatty meats,cheese, butter avocados, nuts and seeds, chocolate and fried foods. ? ? ? ?   ?

## 2021-10-14 ENCOUNTER — Other Ambulatory Visit: Payer: Self-pay

## 2021-10-14 ENCOUNTER — Ambulatory Visit (INDEPENDENT_AMBULATORY_CARE_PROVIDER_SITE_OTHER): Payer: Medicaid Other | Admitting: Pharmacist

## 2021-10-14 ENCOUNTER — Telehealth: Payer: Self-pay

## 2021-10-14 DIAGNOSIS — B182 Chronic viral hepatitis C: Secondary | ICD-10-CM | POA: Diagnosis not present

## 2021-10-14 NOTE — Telephone Encounter (Signed)
[facility] Patient Advocate Encounter ? ?Patient's medication have been couriered to [facility] from [facility] and will be picked up 10/14/21. ? ?2nd box of Mavyret ? ?Clearance Coots , CPhT ?Specialty Pharmacy Patient Advocate ?[facility] ?Phone: [phone] ?Fax:  [phone]  ?
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
## 2021-10-18 ENCOUNTER — Encounter: Payer: Self-pay | Admitting: Neurology

## 2021-10-18 LAB — COMPREHENSIVE METABOLIC PANEL
AG Ratio: 1.3 (calc) (ref 1.0–2.5)
ALT: 9 U/L (ref 9–46)
AST: 15 U/L (ref 10–35)
Albumin: 4.5 g/dL (ref 3.6–5.1)
Alkaline phosphatase (APISO): 125 U/L (ref 35–144)
BUN/Creatinine Ratio: 8 (calc) (ref 6–22)
BUN: 6 mg/dL — ABNORMAL LOW (ref 7–25)
CO2: 26 mmol/L (ref 20–32)
Calcium: 10.2 mg/dL (ref 8.6–10.3)
Chloride: 104 mmol/L (ref 98–110)
Creat: 0.76 mg/dL (ref 0.70–1.30)
Globulin: 3.6 g/dL (calc) (ref 1.9–3.7)
Glucose, Bld: 61 mg/dL — ABNORMAL LOW (ref 65–99)
Potassium: 4 mmol/L (ref 3.5–5.3)
Sodium: 139 mmol/L (ref 135–146)
Total Bilirubin: 0.6 mg/dL (ref 0.2–1.2)
Total Protein: 8.1 g/dL (ref 6.1–8.1)

## 2021-10-18 LAB — HEPATITIS C RNA QUANTITATIVE
HCV Quantitative Log: <1.18 log IU/mL
HCV RNA, PCR, QN: <15 IU/mL

## 2021-10-20 ENCOUNTER — Other Ambulatory Visit (INDEPENDENT_AMBULATORY_CARE_PROVIDER_SITE_OTHER): Payer: Self-pay | Admitting: Primary Care

## 2021-10-20 NOTE — Telephone Encounter (Signed)
Sent to PCP ?

## 2021-10-20 NOTE — Telephone Encounter (Signed)
Routed to PCP 

## 2021-11-01 ENCOUNTER — Telehealth (INDEPENDENT_AMBULATORY_CARE_PROVIDER_SITE_OTHER): Payer: Self-pay

## 2021-11-01 NOTE — Telephone Encounter (Signed)
Copied from [place] [id number]. Topic: Referral - Question ?>> Nov 01, 2021 10:38 AM Pawlus, Brayton Layman A wrote: ?Reason for CRM: Caller wanted to know if it was possible to get the pts referral moved closer to [facility] / [place] area for NEUROLOGY, current referral was sent to a location in [place],  please advise. ? ?After consulting with referral coordinator. She will search for neurology office in [facility] and send referral there if she is able to locate one. If not patient will have to go in [place]. Patient legal guardian is aware. Nat Christen, CMA  ?

## 2021-11-02 ENCOUNTER — Ambulatory Visit: Payer: Self-pay | Admitting: Neurology

## 2021-11-18 ENCOUNTER — Ambulatory Visit: Payer: Self-pay | Admitting: Family

## 2021-11-20 ENCOUNTER — Other Ambulatory Visit (INDEPENDENT_AMBULATORY_CARE_PROVIDER_SITE_OTHER): Payer: Self-pay | Admitting: Primary Care

## 2021-12-02 ENCOUNTER — Ambulatory Visit: Payer: Self-pay | Admitting: Neurology

## 2021-12-02 ENCOUNTER — Encounter: Payer: Self-pay | Admitting: Neurology

## 2021-12-02 DIAGNOSIS — Z029 Encounter for administrative examinations, unspecified: Secondary | ICD-10-CM

## 2021-12-06 ENCOUNTER — Encounter: Payer: Self-pay | Admitting: Family

## 2021-12-06 ENCOUNTER — Ambulatory Visit (INDEPENDENT_AMBULATORY_CARE_PROVIDER_SITE_OTHER): Payer: Medicaid Other | Admitting: Family

## 2021-12-06 ENCOUNTER — Other Ambulatory Visit: Payer: Self-pay

## 2021-12-06 VITALS — BP 143/93 | HR 91 | Temp 98.5°F | Wt 166.0 lb

## 2021-12-06 DIAGNOSIS — B182 Chronic viral hepatitis C: Secondary | ICD-10-CM | POA: Diagnosis not present
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
## 2021-12-06 NOTE — Assessment & Plan Note (Signed)
Jerome [name] has completed 8 weeks of Mavyret for chronic hepatitis C with no adverse side effects and excellent adherence.  Feeling well today.  Check hepatitis C RNA level.  Reviewed the plan of care to include blood work today and repeat in 3 months if HCV RNA level is negative to ensure cure.  Will obtain ultrasound to guide further need of HCC screening at the end of treatment.  Plan for follow-up in 3 months or sooner if needed with lab work on the same day.

## 2021-12-06 NOTE — Patient Instructions (Signed)
Nice to see you.  We will check your lab work today.  We will check an ultrasound of your liver to check for scar tissue.   Plan for follow up in 3 months or sooner if needed with lab work on the same day.  Have a great day and stay safe!

## 2021-12-08 LAB — HEPATITIS C RNA QUANTITATIVE
HCV Quantitative Log: <1.18 log IU/mL
HCV RNA, PCR, QN: <15 IU/mL

## 2021-12-09 ENCOUNTER — Ambulatory Visit (HOSPITAL_COMMUNITY)
Admission: RE | Admit: 2021-12-09 | Discharge: 2021-12-09 | Disposition: A | Discharge status: Home / Self Care | Payer: Self-pay | Source: Ambulatory Visit | Attending: Family | Admitting: Family

## 2021-12-09 ENCOUNTER — Telehealth: Payer: Self-pay

## 2021-12-09 DIAGNOSIS — B182 Chronic viral hepatitis C: Secondary | ICD-10-CM | POA: Insufficient documentation

## 2021-12-09 NOTE — Telephone Encounter (Signed)
-----   Message from Veryl Speak, FNP sent at 12/09/2021  8:43 AM EDT ----- Please inform Mr. Ortwein that his Hepatitis C RNA level is negative and we will recheck in 3 months to determine cure.

## 2021-12-09 NOTE — Telephone Encounter (Signed)
Attempted to call patient regarding lab results. Patient's legal guardian was able to take call on behalf of patient. Relayed results and requested follow up in three months to repeat labs. Call transferred to front desk to assist on getting patient scheduled. Juanita Laster, RMA

## 2021-12-13 ENCOUNTER — Telehealth: Payer: Self-pay

## 2021-12-13 DIAGNOSIS — K7400 Hepatic fibrosis, unspecified: Secondary | ICD-10-CM

## 2021-12-13 NOTE — Addendum Note (Signed)
Addended by: Jeanine Luz D on: 12/13/2021 09:22 AM   Modules accepted: Orders

## 2021-12-13 NOTE — Telephone Encounter (Signed)
Referral to GI placed

## 2021-12-13 NOTE — Telephone Encounter (Signed)
Called patient to relay results, spoke with his legal guardian, Caryn Bee [place]. Relayed that ultrasound looked okay with no evidence of lesions or cirrhosis. Advised that Randle will need to follow up with GI as his fibrosis score has increased. Caryn Bee verbalized understanding and has no further questions.   Sandie Ano, RN

## 2021-12-13 NOTE — Telephone Encounter (Signed)
-----   Message from Veryl Speak, FNP sent at 12/13/2021  8:46 AM EDT ----- Please inform Jerome [name] that his ultrasound looks okay with no evidence of lesions which is good but will need to follow up with Gastroenterology as his fibrosis score is increased. There does not appear to be any clear cirrhosis or signs of liver lesions which is good.

## 2021-12-20 ENCOUNTER — Other Ambulatory Visit (INDEPENDENT_AMBULATORY_CARE_PROVIDER_SITE_OTHER): Payer: Self-pay | Admitting: Primary Care

## 2021-12-20 NOTE — Telephone Encounter (Signed)
Routed to PCP 

## 2021-12-24 ENCOUNTER — Ambulatory Visit (INDEPENDENT_AMBULATORY_CARE_PROVIDER_SITE_OTHER): Payer: Medicaid Other | Admitting: Neurology

## 2021-12-24 ENCOUNTER — Encounter: Payer: Self-pay | Admitting: Neurology

## 2021-12-24 VITALS — BP 137/84 | HR 95 | Wt 167.4 lb

## 2021-12-24 DIAGNOSIS — G40209 Localization-related (focal) (partial) symptomatic epilepsy and epileptic syndromes with complex partial seizures, not intractable, without status epilepticus: Secondary | ICD-10-CM

## 2021-12-24 MED ORDER — DIVALPROEX SODIUM 125 MG PO CSDR: DELAYED_RELEASE_CAPSULE | ORAL | 11 refills | Status: AC
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
## 2022-02-25 ENCOUNTER — Other Ambulatory Visit: Payer: Self-pay

## 2022-03-11 ENCOUNTER — Ambulatory Visit: Payer: Self-pay | Admitting: Family
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
## 2022-03-18 ENCOUNTER — Other Ambulatory Visit (INDEPENDENT_AMBULATORY_CARE_PROVIDER_SITE_OTHER): Payer: Self-pay | Admitting: Primary Care

## 2022-03-18 NOTE — Telephone Encounter (Signed)
Requested by interface surescripts. Requesting medication too soon last refill 02/21/22. Requested Prescriptions  Refused Prescriptions Disp Refills  . metFORMIN (GLUCOPHAGE-XR) 500 MG 24 hr tablet [Pharmacy Med Name: METFORMIN HCL ER 500 MG TAB] 90 tablet 1    Sig: TAKE 1 TABLET BY MOUTH DAILY     Endocrinology:  Diabetes - Biguanides Passed - 03/18/2022  3:39 PM      Passed - Cr in normal range and within 360 days    Creat  Date Value Ref Range Status  10/14/2021 0.76 0.70 - 1.30 mg/dL Final         Passed - HBA1C is between 0 and 7.9 and within 180 days    Hgb A1c MFr Bld  Date Value Ref Range Status  09/23/2021 5.4 4.8 - 5.6 % Final    Comment:    (NOTE) Pre diabetes:          5.7%-6.4%  Diabetes:              >6.4%  Glycemic control for   <7.0% adults with diabetes          Passed - eGFR in normal range and within 360 days    GFR, Estimated  Date Value Ref Range Status  09/27/2021 >60 >60 mL/min Final    Comment:    (NOTE) Calculated using the CKD-EPI Creatinine Equation (2021)          Passed - B12 Level in normal range and within 720 days    Vitamin B-12  Date Value Ref Range Status  05/04/2021 351 180 - 914 pg/mL Final    Comment:    (NOTE) This assay is not validated for testing neonatal or myeloproliferative syndrome specimens for Vitamin B12 levels. Performed at [facility], [place] [address], [place], [place] [address]          Passed - Valid encounter within last 6 months    Recent Outpatient Visits          5 months ago New onset type 2 diabetes mellitus ([place])   [place] Kerin Perna, NP   6 months ago Essential hypertension   [place] Kerin Perna, NP      Future Appointments            In 3 weeks Oletta Lamas, Milford Cage, NP [place]           Passed - CBC within normal limits and completed in the last 12 months    WBC  Date Value Ref Range  Status  09/23/2021 8.6 4.0 - 10.5 K/uL Final   RBC  Date Value Ref Range Status  09/23/2021 4.20 (L) 4.22 - 5.81 MIL/uL Final   Hemoglobin  Date Value Ref Range Status  09/23/2021 13.6 13.0 - 17.0 g/dL Final   HCT  Date Value Ref Range Status  09/23/2021 40.0 39.0 - 52.0 % Final   MCHC  Date Value Ref Range Status  09/23/2021 32.2 30.0 - 36.0 g/dL Final   [facility]  Date Value Ref Range Status  09/23/2021 30.2 26.0 - 34.0 pg Final   MCV  Date Value Ref Range Status  09/23/2021 93.8 80.0 - 100.0 fL Final   No results found for: "PLTCOUNTKUC", "LABPLAT", "POCPLA" RDW  Date Value Ref Range Status  09/23/2021 14.2 11.5 - 15.5 % Final         . folic acid (FOLVITE) 1 MG tablet [Pharmacy Med Name: FOLIC  ACID 1 MG TAB] 90 tablet 1    Sig: TAKE 1 TABLET BY MOUTH DAILY     Endocrinology:  Vitamins Passed - 03/18/2022  3:39 PM      Passed - Valid encounter within last 12 months    Recent Outpatient Visits          5 months ago New onset type 2 diabetes mellitus ([place])   [place] Kerin Perna, NP   6 months ago Essential hypertension   [place], Michelle P, NP      Future Appointments            In 3 weeks Oletta Lamas Milford Cage, NP [place]

## 2022-04-01 ENCOUNTER — Encounter: Payer: Self-pay | Admitting: Gastroenterology

## 2022-04-14 ENCOUNTER — Other Ambulatory Visit (INDEPENDENT_AMBULATORY_CARE_PROVIDER_SITE_OTHER): Payer: Self-pay | Admitting: Primary Care

## 2022-04-14 ENCOUNTER — Ambulatory Visit (INDEPENDENT_AMBULATORY_CARE_PROVIDER_SITE_OTHER): Payer: Medicaid Other | Admitting: Primary Care

## 2022-04-14 NOTE — Telephone Encounter (Signed)
Called pt and made follow up appt with Lennette Bihari (guardian).

## 2022-04-14 NOTE — Telephone Encounter (Signed)
Requested Prescriptions  Pending Prescriptions Disp Refills  . metFORMIN (GLUCOPHAGE-XR) 500 MG 24 hr tablet [Pharmacy Med Name: METFORMIN HCL ER 500 MG TAB] 90 tablet 1    Sig: TAKE 1 TABLET BY MOUTH DAILY     Endocrinology:  Diabetes - Biguanides Failed - 04/14/2022  4:21 PM      Failed - HBA1C is between 0 and 7.9 and within 180 days    Hgb A1c MFr Bld  Date Value Ref Range Status  09/23/2021 5.4 4.8 - 5.6 % Final    Comment:    (NOTE) Pre diabetes:          5.7%-6.4%  Diabetes:              >6.4%  Glycemic control for   <7.0% adults with diabetes          Failed - Valid encounter within last 6 months    Recent Outpatient Visits          6 months ago New onset type 2 diabetes mellitus ([place])   [place] [facility] Kerin Perna, NP   7 months ago Essential hypertension   [place], [place], NP      Future Appointments            In 2 weeks Oletta Lamas, Milford Cage, NP [place] - Cr in normal range and within 360 days    Creat  Date Value Ref Range Status  10/14/2021 0.76 0.70 - 1.30 mg/dL Final         Passed - eGFR in normal range and within 360 days    GFR, Estimated  Date Value Ref Range Status  09/27/2021 >60 >60 mL/min Final    Comment:    (NOTE) Calculated using the CKD-EPI Creatinine Equation (2021)          Passed - B12 Level in normal range and within 720 days    Vitamin B-12  Date Value Ref Range Status  05/04/2021 351 180 - 914 pg/mL Final    Comment:    (NOTE) This assay is not validated for testing neonatal or myeloproliferative syndrome specimens for Vitamin B12 levels. Performed at [facility], [place] [address] [place], [place] [address]          Passed - CBC within normal limits and completed in the last 12 months    WBC  Date Value Ref Range Status  09/23/2021 8.6 4.0 - 10.5 K/uL Final   RBC  Date Value Ref Range Status   09/23/2021 4.20 (L) 4.22 - 5.81 MIL/uL Final   Hemoglobin  Date Value Ref Range Status  09/23/2021 13.6 13.0 - 17.0 g/dL Final   HCT  Date Value Ref Range Status  09/23/2021 40.0 39.0 - 52.0 % Final   MCHC  Date Value Ref Range Status  09/23/2021 32.2 30.0 - 36.0 g/dL Final   [facility]  Date Value Ref Range Status  09/23/2021 30.2 26.0 - 34.0 pg Final   MCV  Date Value Ref Range Status  09/23/2021 93.8 80.0 - 100.0 fL Final   No results found for: "PLTCOUNTKUC", "LABPLAT", "POCPLA" RDW  Date Value Ref Range Status  09/23/2021 14.2 11.5 - 15.5 % Final

## 2022-05-02 ENCOUNTER — Ambulatory Visit (INDEPENDENT_AMBULATORY_CARE_PROVIDER_SITE_OTHER): Payer: Medicaid Other | Admitting: Primary Care

## 2022-05-04 ENCOUNTER — Ambulatory Visit: Payer: Medicaid Other | Admitting: Gastroenterology

## 2022-05-18 ENCOUNTER — Other Ambulatory Visit (INDEPENDENT_AMBULATORY_CARE_PROVIDER_SITE_OTHER): Payer: Self-pay | Admitting: Primary Care

## 2022-05-18 NOTE — Telephone Encounter (Signed)
Requested Prescriptions  Pending Prescriptions Disp Refills   rosuvastatin (CRESTOR) 10 MG tablet [Pharmacy Med Name: ROSUVASTATIN CALCIUM 10 MG TAB] 90 tablet 0    Sig: TAKE 1 TABLET BY MOUTH ONCE EVERY EVENING     Cardiovascular:  Antilipid - Statins 2 Failed - 05/18/2022 10:51 AM      Failed - Lipid Panel in normal range within the last 12 months    Cholesterol, Total  Date Value Ref Range Status  10/12/2021 118 100 - 199 mg/dL Final   LDL Chol Calc (NIH)  Date Value Ref Range Status  10/12/2021 63 0 - 99 mg/dL Final   HDL  Date Value Ref Range Status  10/12/2021 37 (L) >39 mg/dL Final   Triglycerides  Date Value Ref Range Status  10/12/2021 96 0 - 149 mg/dL Final         Passed - Cr in normal range and within 360 days    Creat  Date Value Ref Range Status  10/14/2021 0.76 0.70 - 1.30 mg/dL Final         Passed - Patient is not pregnant      Passed - Valid encounter within last 12 months    Recent Outpatient Visits           7 months ago New onset type 2 diabetes mellitus (HCC)   [facility] Grayce Sessions, NP   8 months ago Essential hypertension   [facility] Grayce Sessions, NP

## 2022-07-22 ENCOUNTER — Ambulatory Visit: Payer: Medicaid Other | Admitting: Neurology

## 2022-07-22 ENCOUNTER — Encounter: Payer: Self-pay | Admitting: Neurology

## 2022-08-10 ENCOUNTER — Ambulatory Visit: Payer: Medicaid Other | Admitting: Podiatry

## 2023-04-03 IMAGING — DX DG SHOULDER 2+V*R*
3 series · 3 of 3 positions shown · non-contrast
Comparison: 06/22/2021

CLINICAL DATA: Previous internal fixation

EXAM:
RIGHT SHOULDER - 2+ VIEW

[t shoulder internal right (1 of 2)]
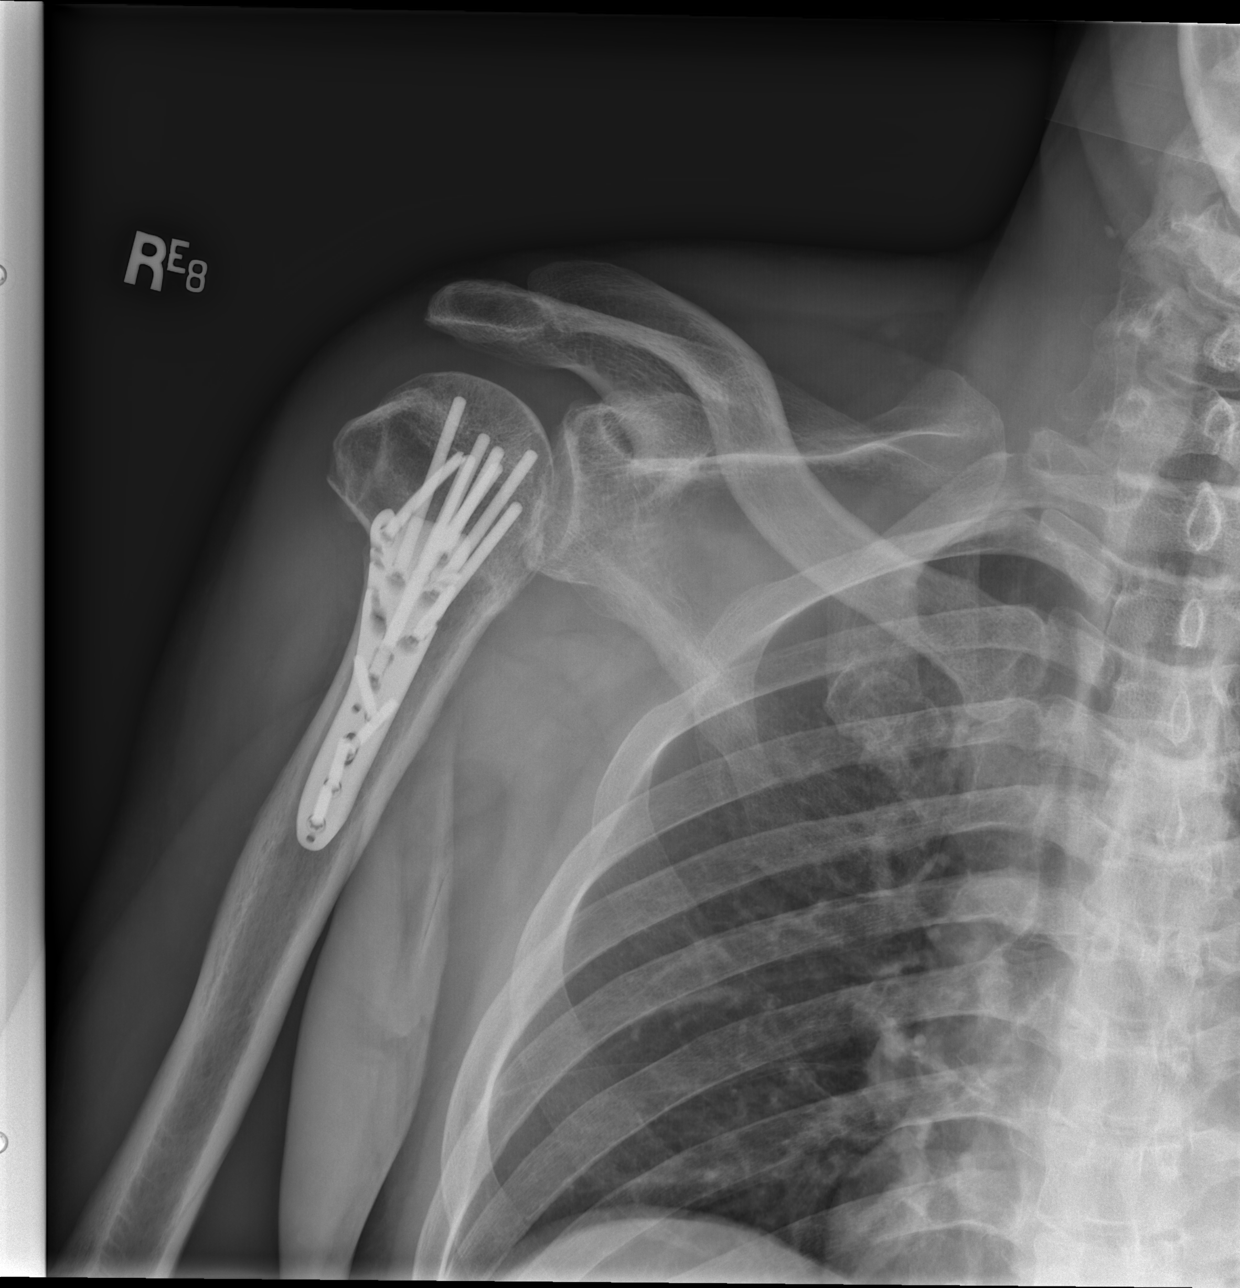

[t shoulder internal right (2 of 2)]
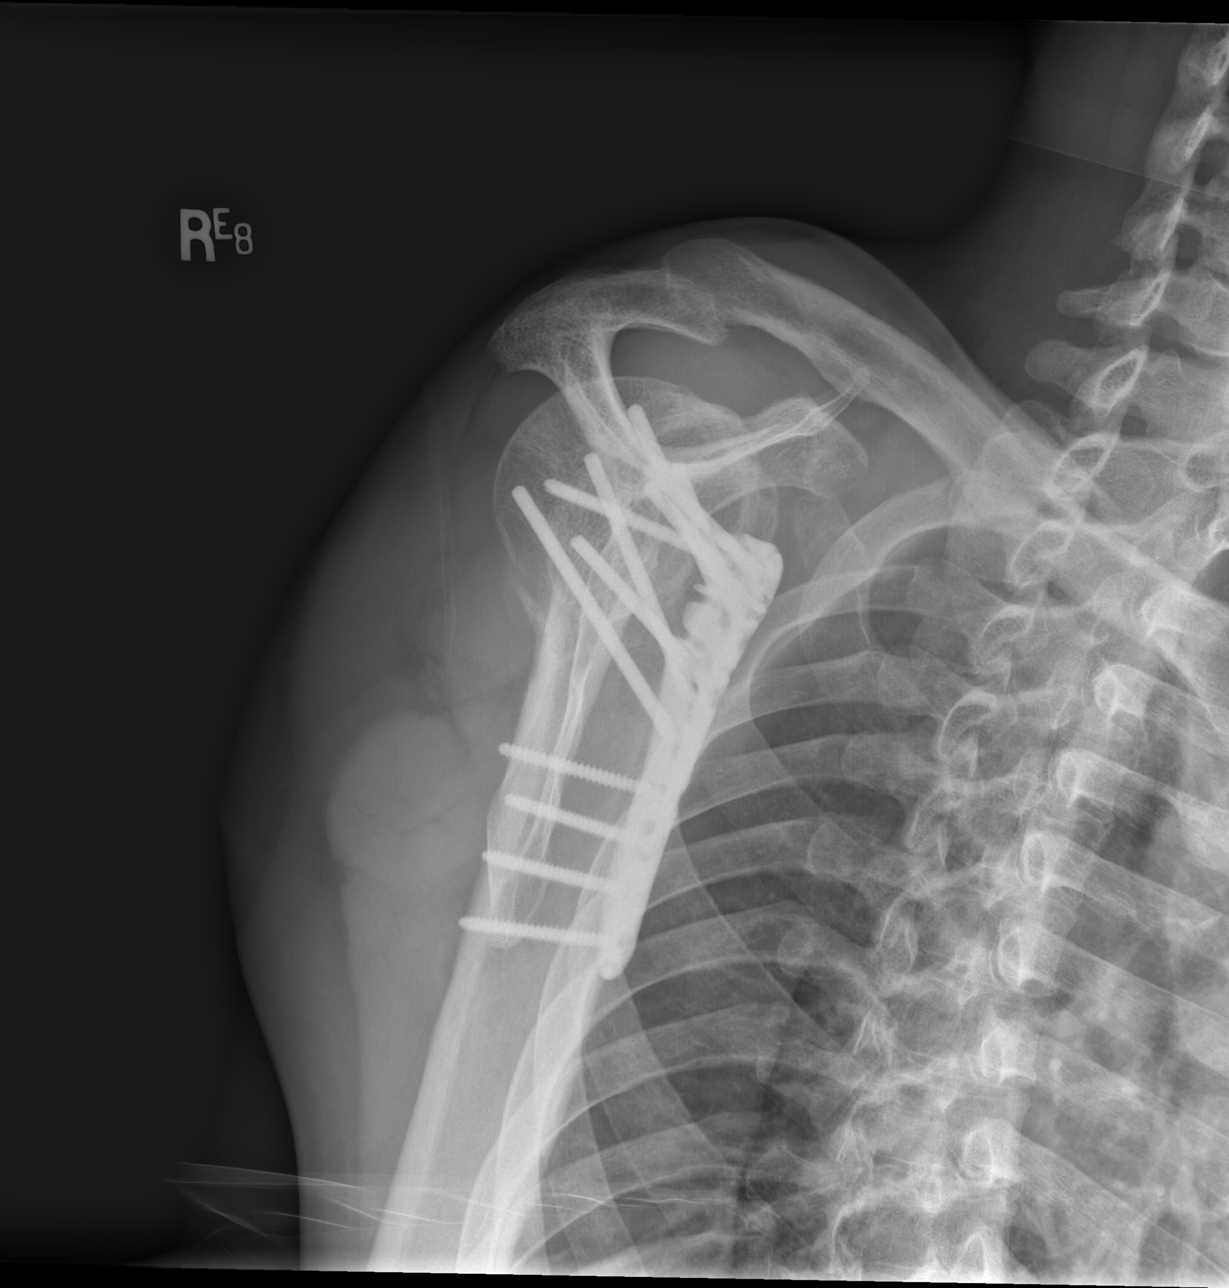

[x shoulder axillary right]
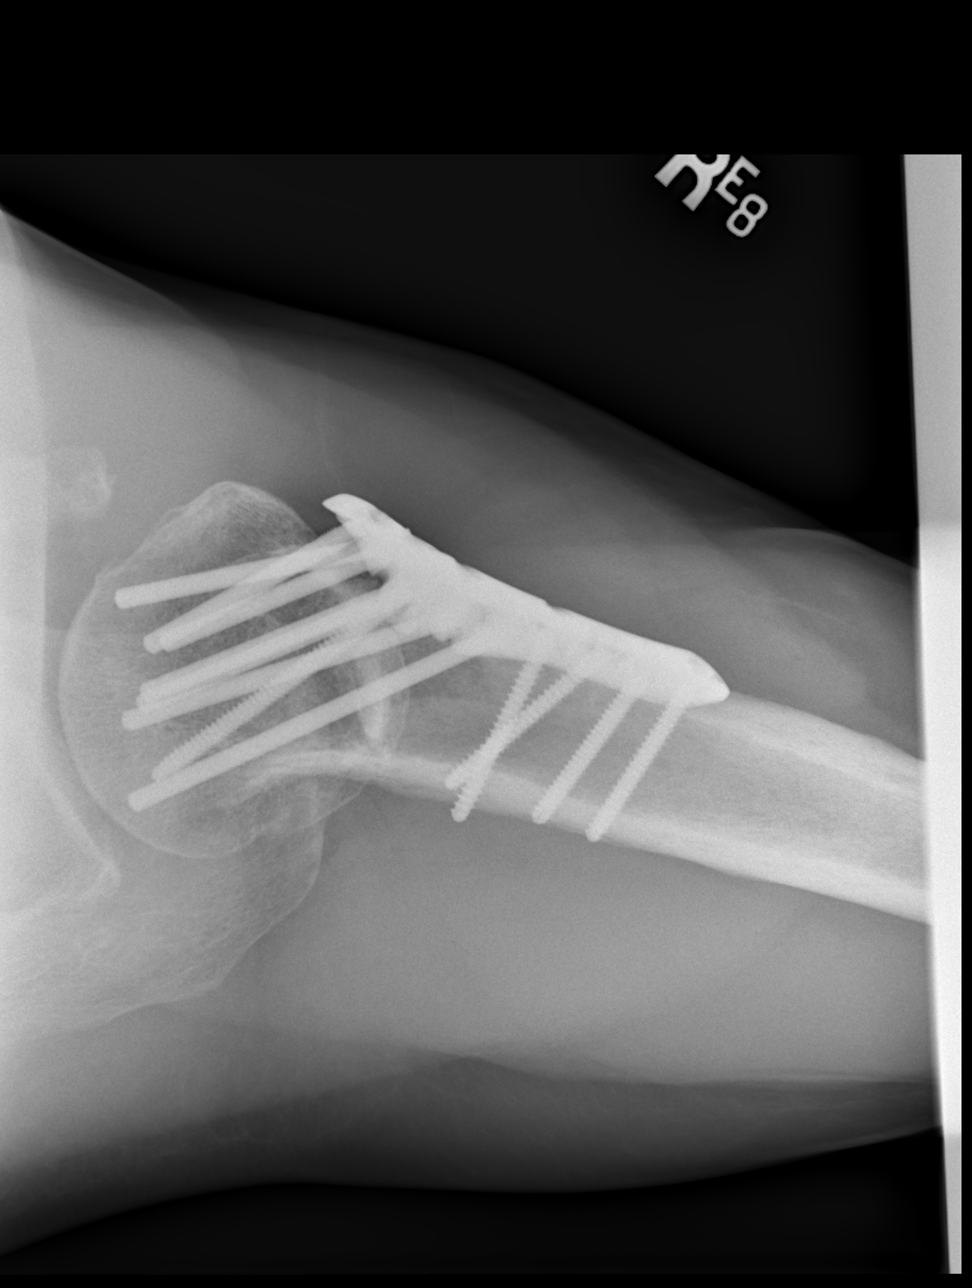

[3 of 3 positions shown; findings below may reference images not displayed]

FINDINGS: There is previous internal fixation of fracture of proximal humerus
with metallic plate and multiple screws. Fracture line is less
distinct. No new fracture or dislocation is seen.
IMPRESSION: Previous internal fixation in the right proximal humerus with no
significant change in alignment. No new fracture or dislocation is
seen.

## 2023-04-03 IMAGING — DX DG TIBIA/FIBULA 2V*R*
2 series · 2 of 2 positions shown · non-contrast
Comparison: 06/22/2021

CLINICAL DATA: Fracture tibia and fibula

EXAM:
RIGHT TIBIA AND FIBULA - 2 VIEW

[t tib-fib ap right]
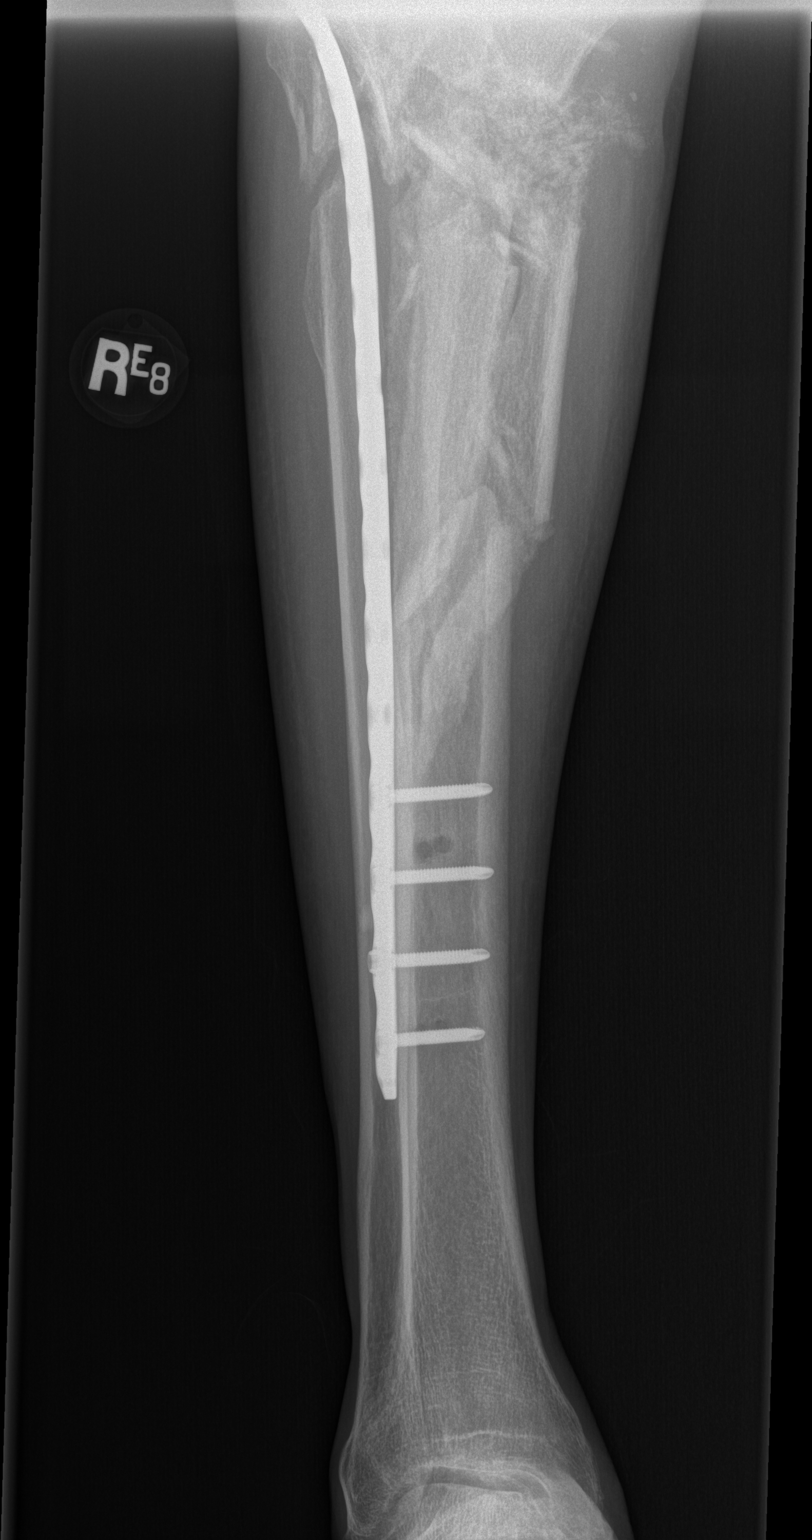

[t tib-fib lat right]
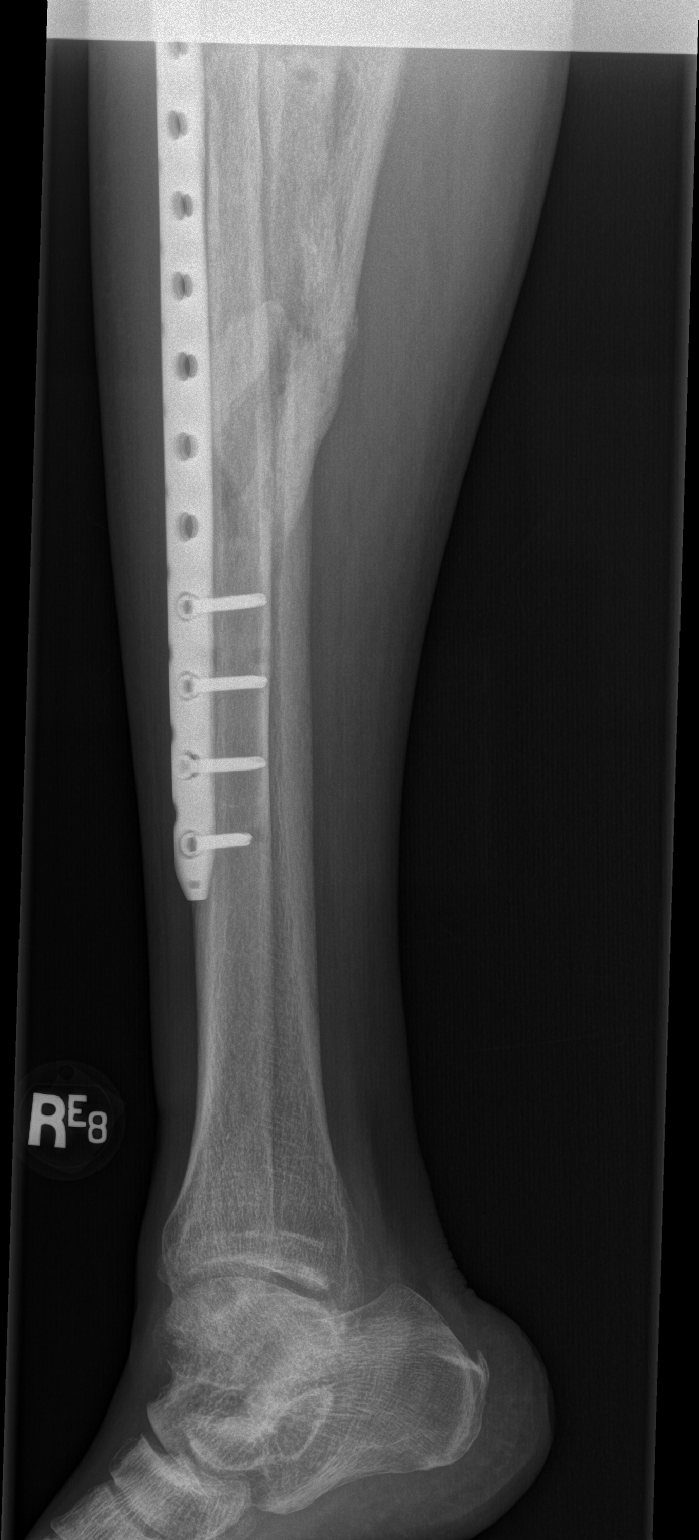

[2 of 2 positions shown; findings below may reference images not displayed]

FINDINGS: Severely comminuted fracture is seen in the proximal and mid shafts
of right tibia. Fracture is seen in the proximal shaft of right
fibula. There is previous internal fixation with metallic plate and
multiple screws in the tibia. There is no significant interval
change in alignment of fracture fragments. No new fracture or
dislocation is seen.
IMPRESSION: Previous internal fixation of fracture of right tibia and fibula. No
significant interval changes are noted in the alignment of fracture
fragments. No new fracture or dislocation is seen.

## 2023-04-03 IMAGING — DX DG KNEE COMPLETE 4+V*R*
4 series · 4 of 4 positions shown · non-contrast
Comparison: Previous studies including the examination of
06/22/2021

CLINICAL DATA: Previous trauma previous internal fixation

EXAM:
RIGHT KNEE - COMPLETE 4+ VIEW

[t knee ap right (1 of 3)]
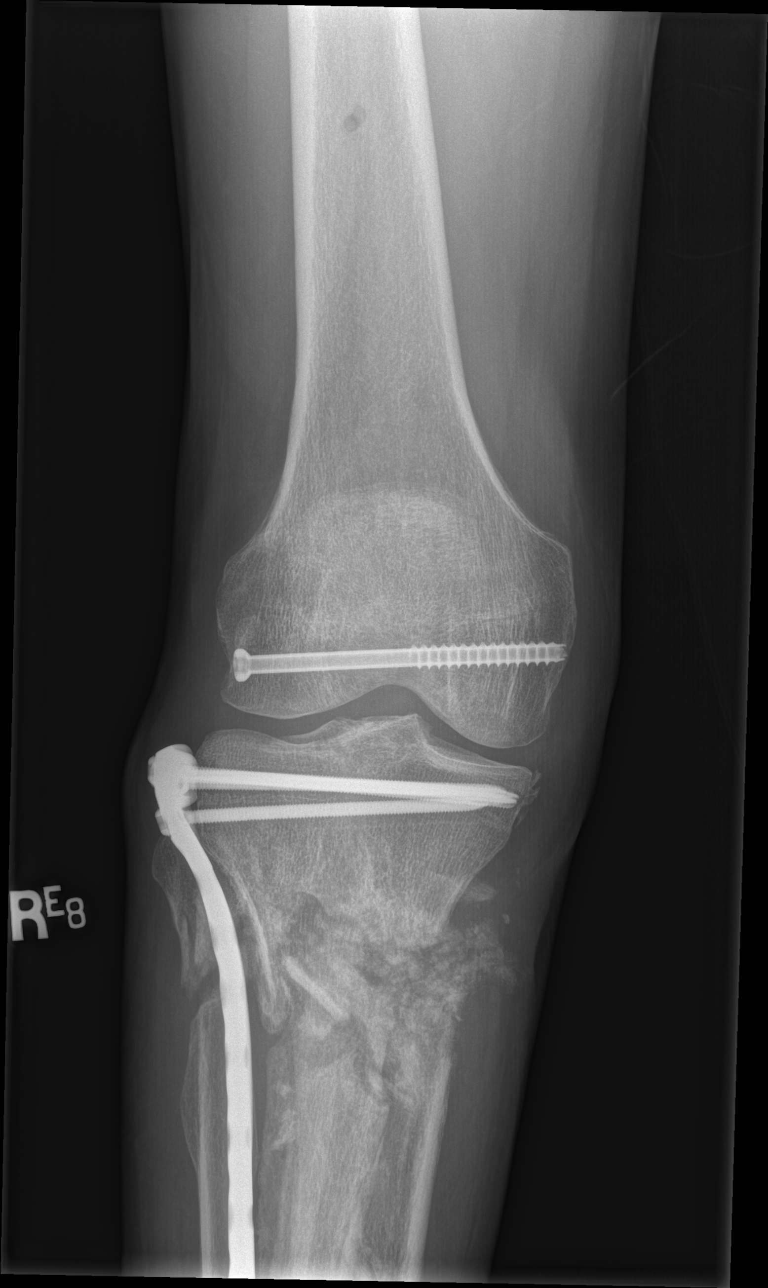

[t knee ap right (2 of 3)]
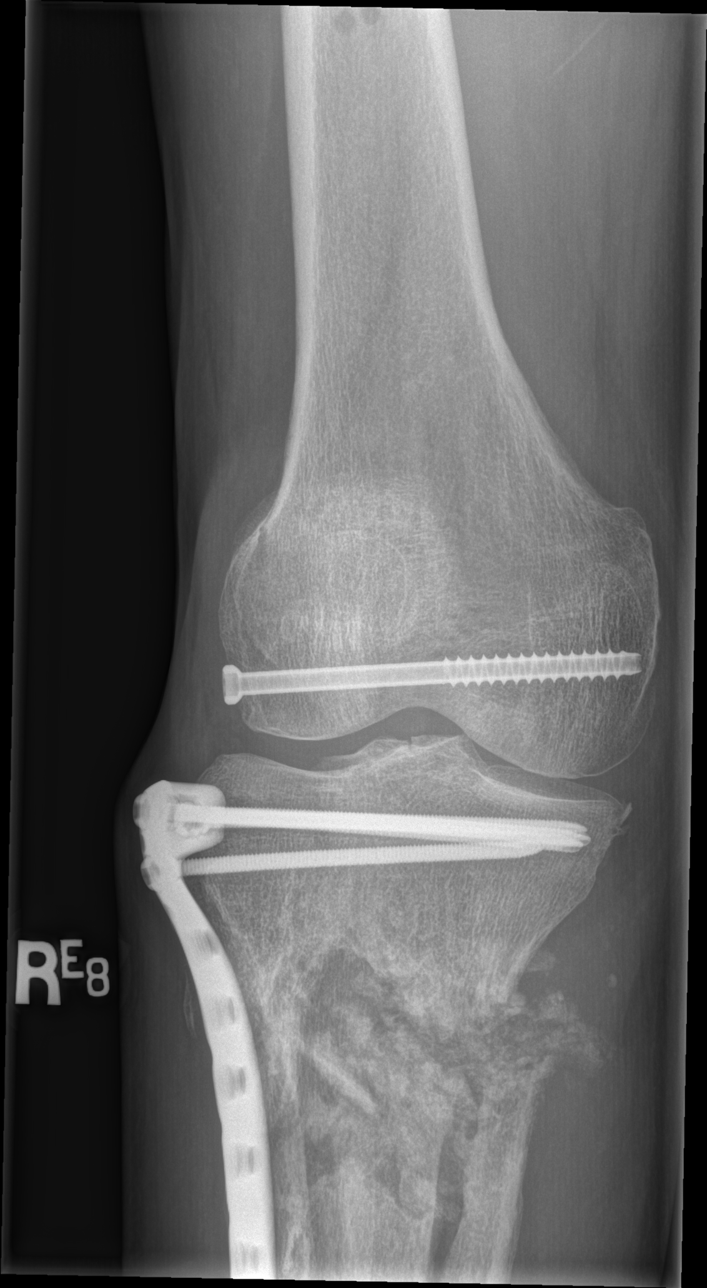

[t knee ap right (3 of 3)]
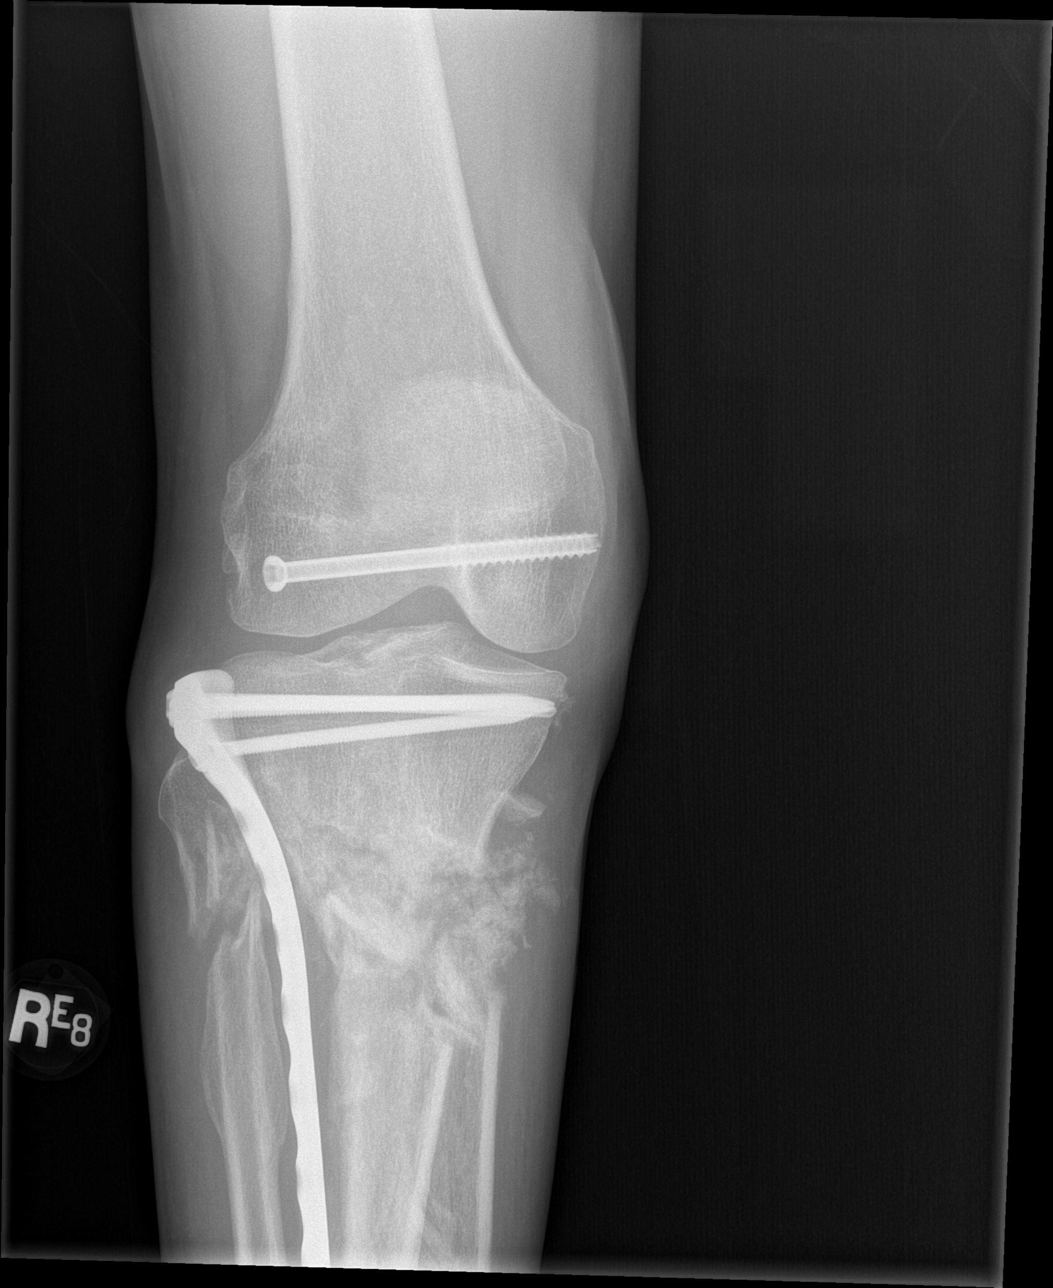

[t knee lat right]
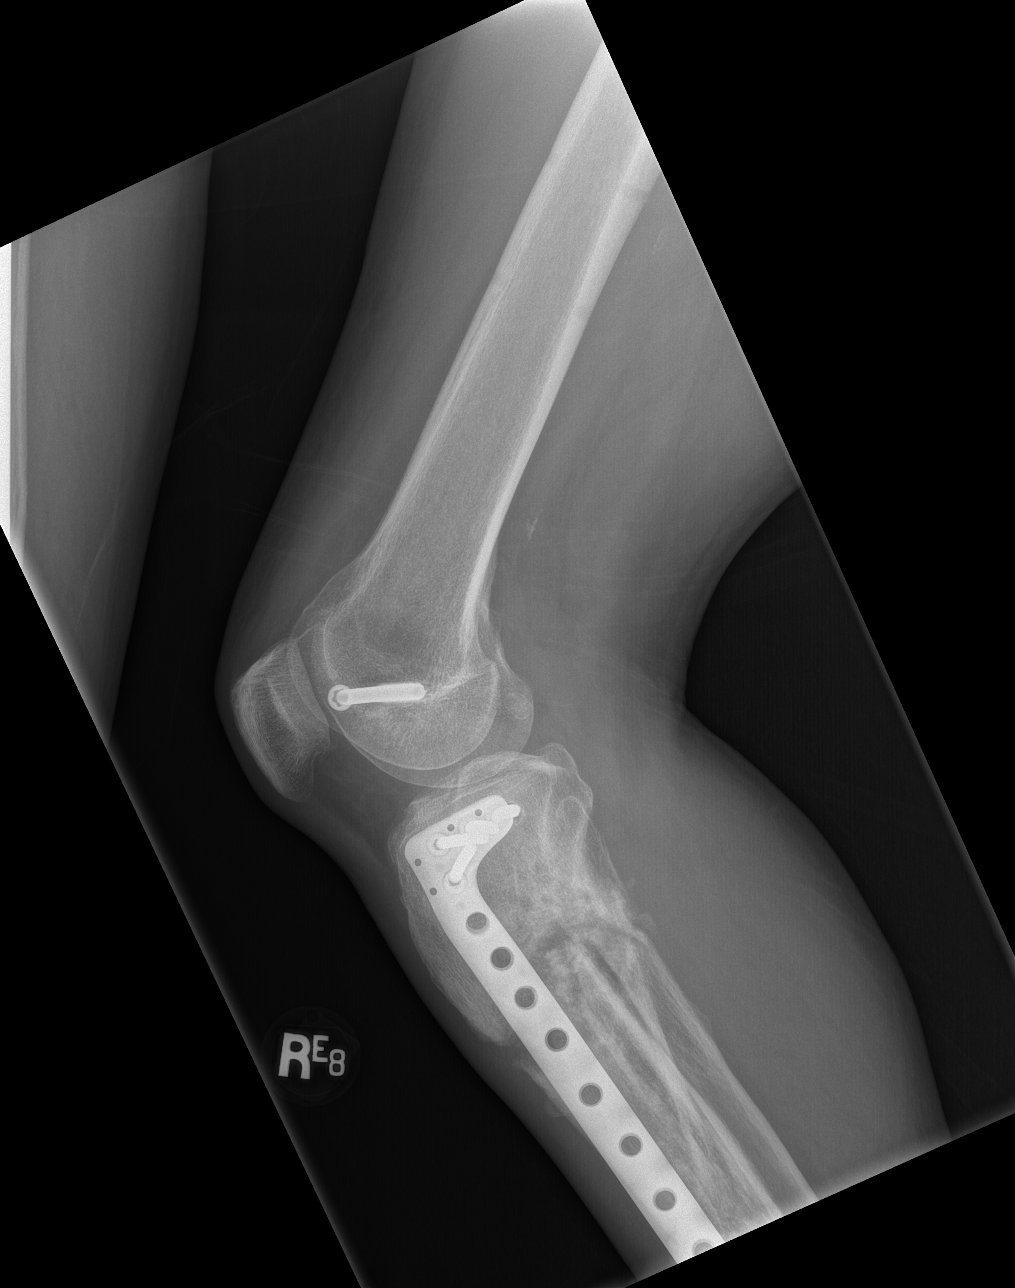

[4 of 4 positions shown; findings below may reference images not displayed]

FINDINGS: Severely comminuted fractures seen in the shaft of right tibia and
fibula. There is previous internal fixation with metallic plate and
multiple screws in the tibia. A single surgical screw is seen in the
distal femur. No significant interval changes are noted in alignment
of fracture fragments.
IMPRESSION: Previous internal fixation in the distal femur and right tibia.
Comminuted fractures are seen in the proximal tibia and fibula with
no significant change in alignment.

## 2023-04-04 IMAGING — DX DG CHEST 2V
2 series · 2 of 2 positions shown · non-contrast
Comparison: 03/28/2021

CLINICAL DATA: Rule out TB. History of trauma. Loss of
consciousness.

EXAM:
CHEST - 2 VIEW

[w chest pa]
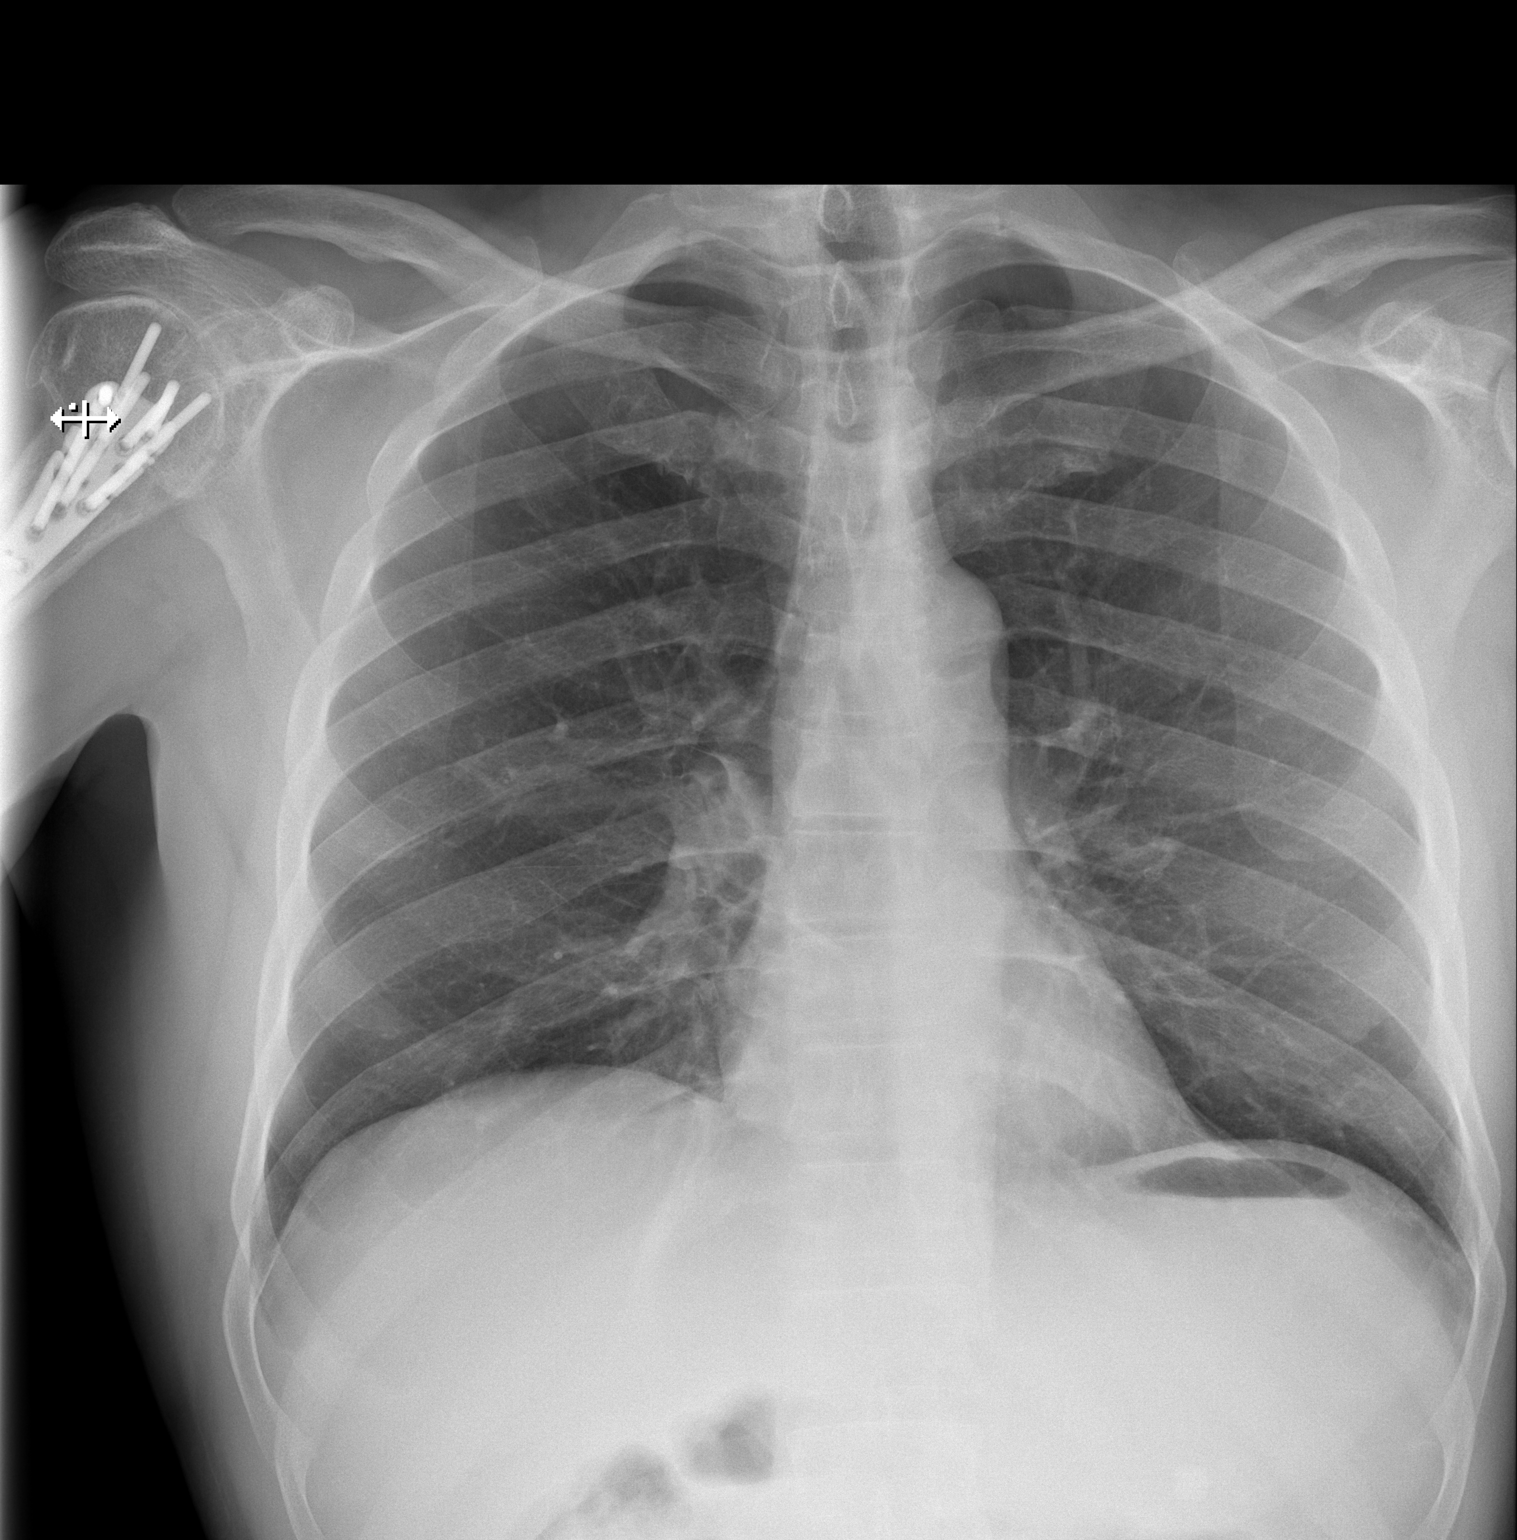

[w chest lat]
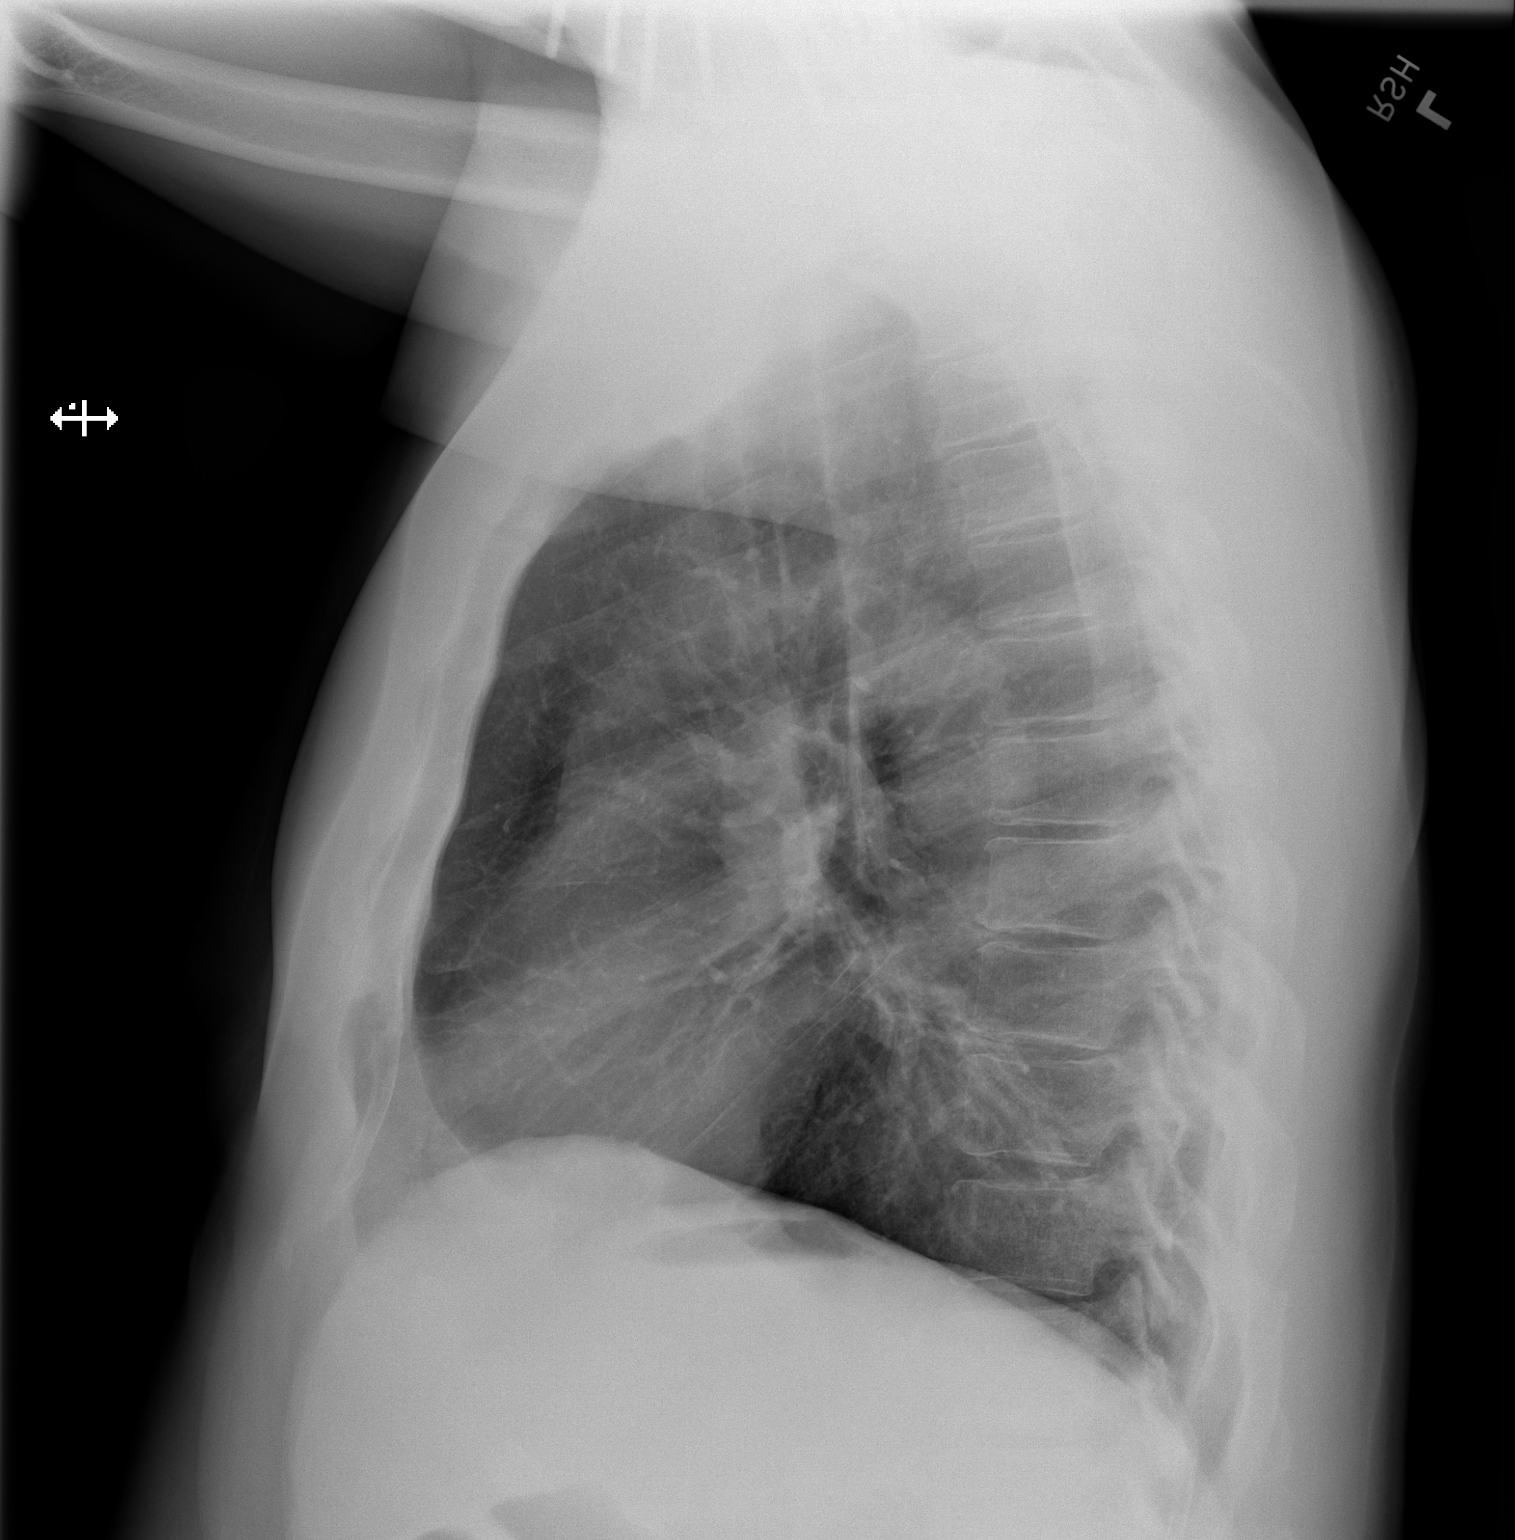

[2 of 2 positions shown; findings below may reference images not displayed]

FINDINGS: Interval extubation. Interval removal of enteric tube and left
subclavian central venous line.

Cardiac silhouette and mediastinal contours are within normal
limits. The lungs are clear. No pleural effusion or pneumothorax.

Partial visualization of plate and screw hardware overlying the
proximal right humerus.
IMPRESSION: No acute lung process.  No mediastinal lymphadenopathy.
